# Patient Record
Sex: Female | Born: 1965 | Race: Black or African American | Hispanic: No | Marital: Married | Smoking: Never smoker
Health system: Southern US, Community
[De-identification: ages and names within clinical notes are randomized; demographics above are authoritative.]

## PROBLEM LIST (undated history)

## (undated) DIAGNOSIS — J302 Other seasonal allergic rhinitis: Secondary | ICD-10-CM

## (undated) DIAGNOSIS — E119 Type 2 diabetes mellitus without complications: Secondary | ICD-10-CM

## (undated) DIAGNOSIS — D649 Anemia, unspecified: Secondary | ICD-10-CM

## (undated) DIAGNOSIS — G4730 Sleep apnea, unspecified: Secondary | ICD-10-CM

## (undated) DIAGNOSIS — K649 Unspecified hemorrhoids: Secondary | ICD-10-CM

## (undated) DIAGNOSIS — D219 Benign neoplasm of connective and other soft tissue, unspecified: Secondary | ICD-10-CM

## (undated) HISTORY — PX: KNEE SURGERY: SHX244

## (undated) HISTORY — DX: Type 2 diabetes mellitus without complications: E11.9

## (undated) HISTORY — DX: Unspecified hemorrhoids: K64.9

## (undated) HISTORY — DX: Benign neoplasm of connective and other soft tissue, unspecified: D21.9

---

## 1995-03-01 HISTORY — PX: TUBAL LIGATION: SHX77

## 2004-08-16 ENCOUNTER — Emergency Department: Payer: Self-pay | Admitting: General Practice

## 2007-07-31 ENCOUNTER — Ambulatory Visit: Payer: Self-pay | Admitting: Family Medicine

## 2008-02-29 HISTORY — PX: LEEP: SHX91

## 2008-09-09 ENCOUNTER — Ambulatory Visit: Payer: Self-pay | Admitting: Family Medicine

## 2008-09-12 ENCOUNTER — Ambulatory Visit: Payer: Self-pay | Admitting: Family Medicine

## 2008-12-02 ENCOUNTER — Ambulatory Visit: Payer: Self-pay | Admitting: Unknown Physician Specialty

## 2008-12-24 ENCOUNTER — Emergency Department: Payer: Self-pay | Admitting: Unknown Physician Specialty

## 2009-01-19 ENCOUNTER — Ambulatory Visit: Payer: Self-pay | Admitting: Family Medicine

## 2009-06-25 ENCOUNTER — Ambulatory Visit: Payer: Self-pay | Admitting: Otolaryngology

## 2010-04-07 ENCOUNTER — Ambulatory Visit: Payer: Self-pay | Admitting: Family Medicine

## 2011-04-26 ENCOUNTER — Ambulatory Visit: Payer: Self-pay | Admitting: Family Medicine

## 2012-02-02 ENCOUNTER — Ambulatory Visit: Payer: Self-pay | Admitting: Internal Medicine

## 2012-02-29 ENCOUNTER — Ambulatory Visit: Payer: Self-pay | Admitting: Internal Medicine

## 2012-02-29 HISTORY — PX: TONSILLECTOMY: SUR1361

## 2012-05-15 ENCOUNTER — Ambulatory Visit: Payer: Self-pay | Admitting: Internal Medicine

## 2012-06-26 ENCOUNTER — Ambulatory Visit: Payer: Self-pay | Admitting: Unknown Physician Specialty

## 2012-07-02 ENCOUNTER — Ambulatory Visit: Payer: Self-pay | Admitting: Otolaryngology

## 2012-07-03 LAB — PATHOLOGY REPORT

## 2013-07-10 ENCOUNTER — Ambulatory Visit: Payer: Self-pay | Admitting: Internal Medicine

## 2013-09-02 ENCOUNTER — Ambulatory Visit: Payer: Self-pay

## 2013-09-12 ENCOUNTER — Ambulatory Visit: Payer: Self-pay | Admitting: Internal Medicine

## 2013-09-22 ENCOUNTER — Emergency Department: Payer: Self-pay | Admitting: Emergency Medicine

## 2013-09-22 LAB — BASIC METABOLIC PANEL
Anion Gap: 8 (ref 7–16)
BUN: 19 mg/dL — ABNORMAL HIGH (ref 7–18)
Calcium, Total: 8.5 mg/dL (ref 8.5–10.1)
Chloride: 105 mmol/L (ref 98–107)
Co2: 27 mmol/L (ref 21–32)
Creatinine: 0.98 mg/dL (ref 0.60–1.30)
EGFR (African American): >60
Glucose: 89 mg/dL (ref 65–99)
Osmolality: 281 (ref 275–301)
Potassium: 3.8 mmol/L (ref 3.5–5.1)
Sodium: 140 mmol/L (ref 136–145)

## 2013-09-22 LAB — CBC
HCT: 36.2 % (ref 35.0–47.0)
HGB: 12.1 g/dL (ref 12.0–16.0)
MCH: 29.6 pg (ref 26.0–34.0)
MCHC: 33.4 g/dL (ref 32.0–36.0)
MCV: 89 fL (ref 80–100)
Platelet: 284 10*3/uL (ref 150–440)
RBC: 4.09 10*6/uL (ref 3.80–5.20)
RDW: 12.9 % (ref 11.5–14.5)
WBC: 5.2 10*3/uL (ref 3.6–11.0)

## 2013-09-22 LAB — HEPATIC FUNCTION PANEL A (ARMC)
ALT: 28 U/L
Albumin: 4 g/dL (ref 3.4–5.0)
Alkaline Phosphatase: 50 U/L
Bilirubin, Direct: <0.1 mg/dL (ref 0.00–0.20)
Bilirubin,Total: 0.3 mg/dL (ref 0.2–1.0)
SGOT(AST): 39 U/L — ABNORMAL HIGH (ref 15–37)
Total Protein: 7.9 g/dL (ref 6.4–8.2)

## 2013-09-22 LAB — TROPONIN I

## 2013-09-22 LAB — LIPASE, BLOOD: Lipase: 335 U/L (ref 73–393)

## 2013-09-23 ENCOUNTER — Encounter: Payer: Self-pay | Admitting: General Surgery

## 2013-09-23 ENCOUNTER — Ambulatory Visit (INDEPENDENT_AMBULATORY_CARE_PROVIDER_SITE_OTHER): Payer: BC Managed Care – PPO | Admitting: General Surgery

## 2013-09-23 VITALS — BP 122/74 | HR 76 | Resp 12 | Ht 64.0 in | Wt 174.0 lb

## 2013-09-23 DIAGNOSIS — K8020 Calculus of gallbladder without cholecystitis without obstruction: Secondary | ICD-10-CM

## 2013-09-23 NOTE — Progress Notes (Signed)
Patient ID: Mandy Shea, female   DOB: 05/30/1965, 48 y.o.   MRN: [id number]  Chief Complaint  Patient presents with  . Other    gallstones    HPI Mandy Shea is a 48 y.o. female  Here today for a evaluation of gallstones. Patient states she has been having abdominal pain for 2 months in her right upper quadrant.  Patient states she pain last for about two to three hours . She had a ultrasound done on 08/13/13 at [facility].Patient was seen in the ER on 09/21/13 and had labs done.  She reports she has had 8-9 episodes of pain in ruq area, lasting upto 3-4 hrs. Cannot get comfortable during the attacks. Has had vomiting and some diarrhea with pain. HPI  Past Medical History  Diagnosis Date  . Diabetes mellitus without complication   . Hemorrhoids     Past Surgical History  Procedure Laterality Date  . Tubal ligation  1997  . Tonsillectomy  2014  . Knee surgery Right     History reviewed. No pertinent family history.  Social History History  Substance Use Topics  . Smoking status: Never Smoker   . Smokeless tobacco: Never Used  . Alcohol Use: No    Allergies  Allergen Reactions  . Augmentin [Amoxicillin-Pot Clavulanate] Itching    Current Outpatient Prescriptions  Medication Sig Dispense Refill  . loratadine (CLARITIN) 10 MG tablet Take 10 mg by mouth daily.      Marland Kitchen losartan (COZAAR) 25 MG tablet Take 25 mg by mouth daily.      . montelukast (SINGULAIR) 10 MG tablet Take 10 mg by mouth at bedtime.      . Saxagliptin-Metformin (KOMBIGLYZE XR) 2.06-998 MG TB24 Take 2 tablets by mouth daily.      . simvastatin (ZOCOR) 20 MG tablet Take 20 mg by mouth daily.      Marland Kitchen VITAMIN D, CHOLECALCIFEROL, PO Take 1 tablet by mouth daily.       No current facility-administered medications for this visit.    Review of Systems Review of Systems  Constitutional: Positive for chills. Negative for fever, diaphoresis, activity change, appetite change, fatigue and unexpected weight  change.  Respiratory: Negative.   Cardiovascular: Negative.   Gastrointestinal: Positive for nausea, vomiting, abdominal pain, diarrhea and anal bleeding. Negative for constipation, blood in stool, abdominal distention and rectal pain.    Blood pressure 122/74, pulse 76, resp. rate 12, height 5\' 4"  (1.626 m), weight 174 lb (78.926 kg), last menstrual period 09/23/2013.  Physical Exam Physical Exam  Constitutional: She is oriented to person, place, and time. She appears well-developed and well-nourished.  Eyes: Conjunctivae are normal. No scleral icterus.  Neck: Neck supple.  Cardiovascular: Normal rate, regular rhythm and normal heart sounds.   Pulmonary/Chest: Effort normal and breath sounds normal.  Abdominal: Soft. Normal appearance and bowel sounds are normal. She exhibits no mass. There is no hepatomegaly. There is no tenderness. No hernia.  Neurological: She is alert and oriented to person, place, and time.  Skin: Skin is warm and dry.    Data Reviewed Ultrasound and labs reviewed. CBC.LFTs were normal. US showed multiple tiny stones  Assessment    Symptoms consistent with biliary colic. Symptomatic cholelithiasis     Plan    Discussed cholecystectomy. Procedure, risks and benefits discussed    Patient's surgery has been scheduled for 09-26-13 at [facility].   Saray Capasso G 09/23/2013, 12:01 PM

## 2013-09-23 NOTE — Patient Instructions (Addendum)

## 2013-09-25 ENCOUNTER — Ambulatory Visit: Payer: Self-pay | Admitting: General Surgery

## 2013-09-26 ENCOUNTER — Ambulatory Visit: Payer: Self-pay | Admitting: General Surgery

## 2013-09-26 ENCOUNTER — Encounter: Payer: Self-pay | Admitting: General Surgery

## 2013-09-26 DIAGNOSIS — K8020 Calculus of gallbladder without cholecystitis without obstruction: Secondary | ICD-10-CM

## 2013-09-26 HISTORY — PX: CHOLECYSTECTOMY: SHX55

## 2013-09-30 ENCOUNTER — Telehealth: Payer: Self-pay | Admitting: *Deleted

## 2013-09-30 ENCOUNTER — Encounter: Payer: Self-pay | Admitting: General Surgery

## 2013-09-30 NOTE — Telephone Encounter (Signed)
Patient called the answering service during lunch and requested a call back.   This patient was contacted and states that she has swelling in her right arm status post gallbladder surgery that was done on 09-26-13. Patient states this was the same arm that IV was in at time of surgery. She was instructed to apply heat to the area and call the office if she has further concerns. Patient verbalizes understanding.

## 2013-10-01 LAB — PATHOLOGY REPORT

## 2013-10-03 ENCOUNTER — Encounter: Payer: Self-pay | Admitting: General Surgery

## 2013-10-10 ENCOUNTER — Ambulatory Visit (INDEPENDENT_AMBULATORY_CARE_PROVIDER_SITE_OTHER): Payer: Self-pay | Admitting: General Surgery

## 2013-10-10 ENCOUNTER — Encounter: Payer: Self-pay | Admitting: General Surgery

## 2013-10-10 VITALS — BP 118/70 | HR 74 | Resp 12 | Ht 64.0 in | Wt 169.0 lb

## 2013-10-10 DIAGNOSIS — K8020 Calculus of gallbladder without cholecystitis without obstruction: Secondary | ICD-10-CM

## 2013-10-10 NOTE — Patient Instructions (Addendum)
The patient is aware to call back for any questions or concerns. Patient to return as needed. 

## 2013-10-10 NOTE — Progress Notes (Signed)
Here today for postoperative visit, laparoscopic cholecystectomy done 09-26-13. Patient doing well no complaints.   Port sites clean and healing well. Abdomen is soft, nontender. Lungs clear Patient to return as needed.

## 2013-10-11 ENCOUNTER — Encounter: Payer: Self-pay | Admitting: General Surgery

## 2013-12-31 ENCOUNTER — Encounter: Payer: Self-pay | Admitting: General Surgery

## 2014-01-01 IMAGING — MG MAM DGTL SCRN MAM NO ORDER W/CAD
1 series · 4 of 4 positions shown · non-contrast
Comparison: none

REASON FOR EXAM: SCR MAMMO NO ORDER
COMMENTS:

[Series 1340: R CC · right · 4 of 4 slices shown]
[im 1/4]
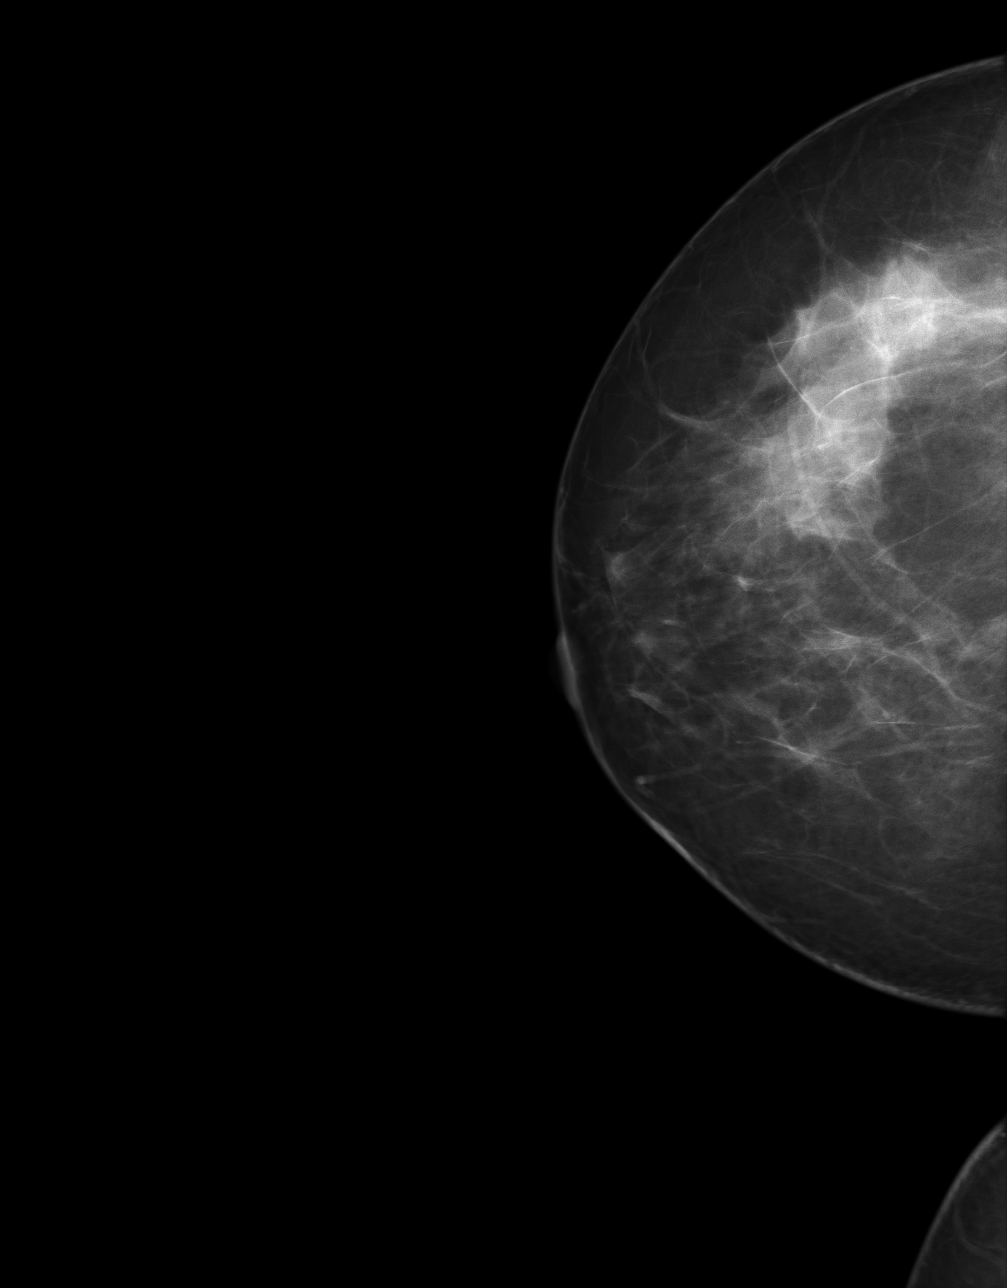
[im 2/4]
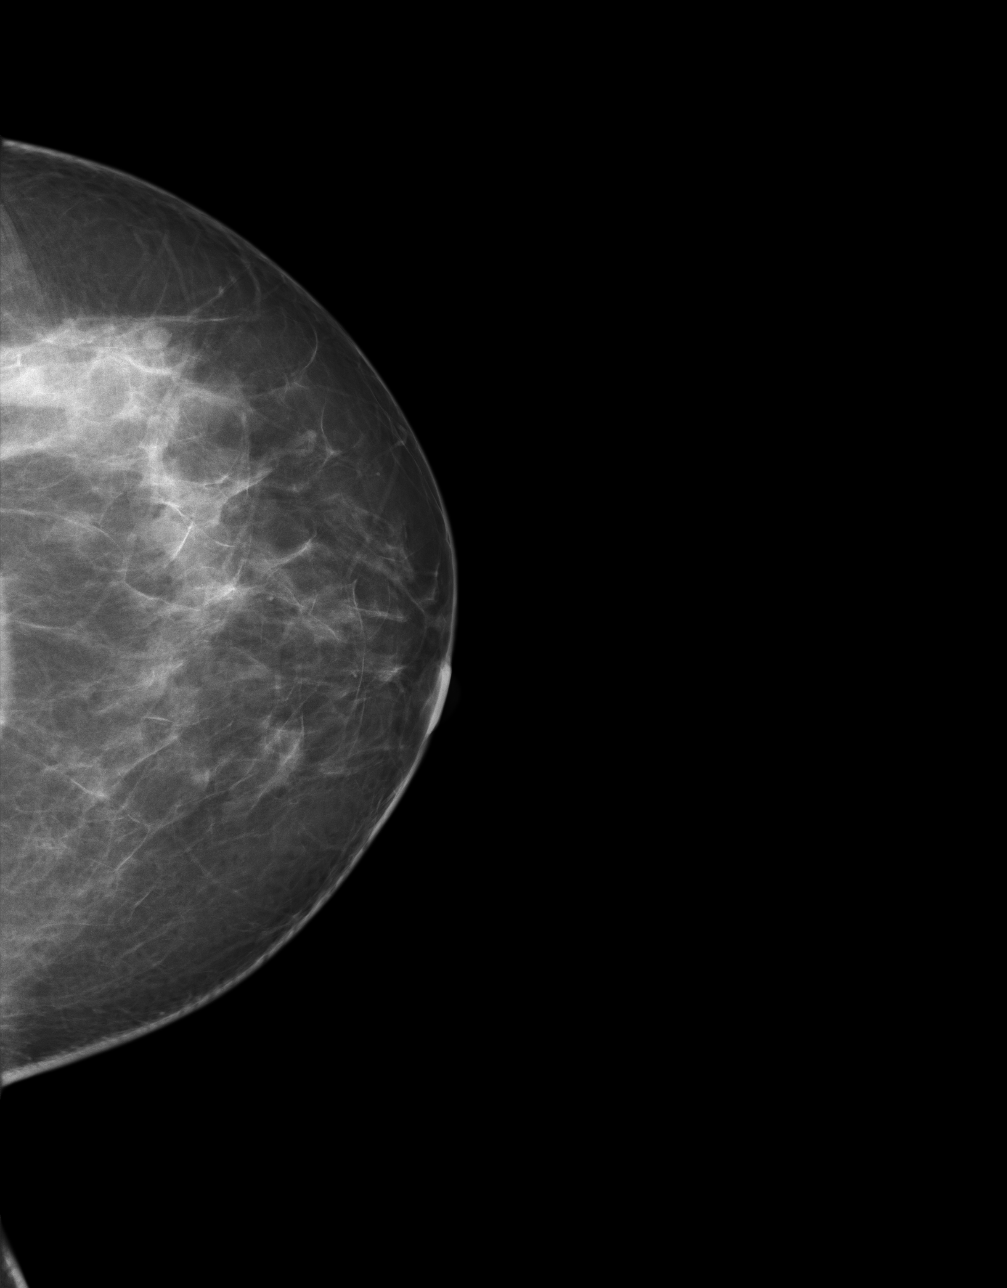
[im 3/4]
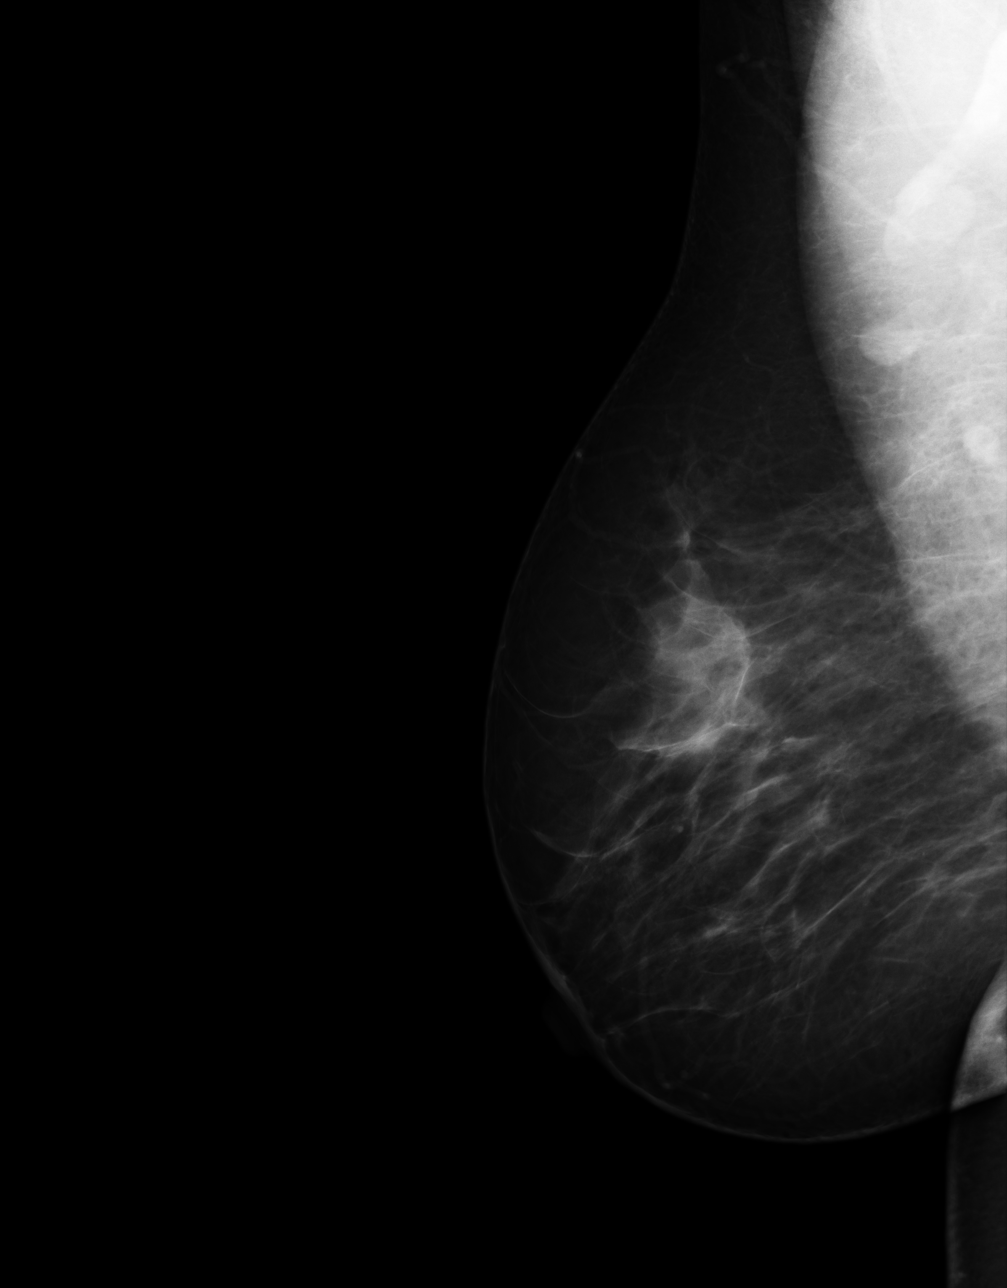
[im 4/4]
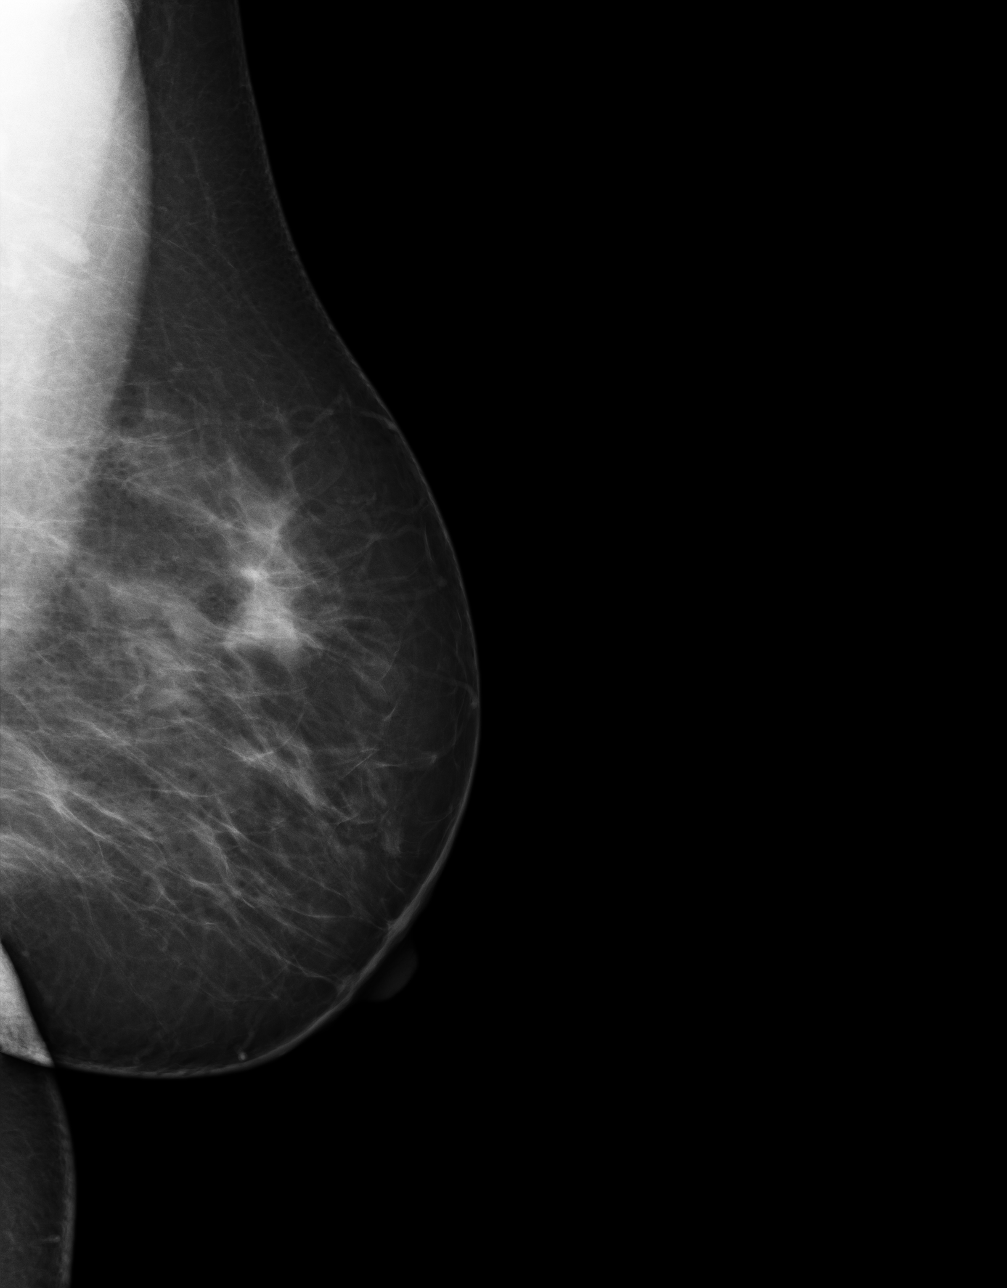

[4 of 4 positions shown; findings below may reference images not displayed]

PROCEDURE:     MAM - MAM DGTL SCRN MAM NO ORDER W/CAD  - April 26, 2011  [DATE]

RESULT:     Comparison is made to prior examinations April 07, 2010,
09/09/2008 and April 23, 2002.

There are scattered fibroglandular elements bilaterally. No dominant mass or
malignant-appearing microcalcifications are seen. A stable-appearing
intramammary lymph node is again noted laterally on the left.
IMPRESSION: 1. Bilaterally benign-appearing screening mammography.
2. Continued annual screening mammography is recommended.
3. BI-RADS: Category 1-Negative.

A negative mammogram report does not preclude biopsy or other evaluation of
a clinically palpable or otherwise suspicious mass or lesion.  Breast cancer
may not be detected by mammography in up to 10% of cases.

## 2014-06-20 NOTE — Op Note (Signed)
PATIENT NAME:  Mandy Shea, BETTCHER MR#:  [id number] DATE OF BIRTH:  November 23, 1965  DATE OF PROCEDURE:  07/02/2012  PREOPERATIVE DIAGNOSIS: Chronic tonsillitis and tonsillolithiasis.   POSTOPERATIVE DIAGNOSIS: Chronic tonsillitis and tonsillolithiasis.  PROCEDURE PERFORMED: Tonsillectomy, greater than age 48.   SURGEON: Carloyn Manner, M.D.   ANESTHESIA: General endotracheal anesthesia.   ESTIMATED BLOOD LOSS: 20 mL.   IV FLUIDS: Please see anesthesia record.   COMPLICATIONS: None.   DRAINS/STENT PLACEMENTS: None.   SPECIMENS: Right and left tonsils.   INDICATIONS FOR PROCEDURE: The patient is a 49 year old female with history of sleep disordered breathing, tonsillar hypertrophy and tonsillolithiasis resistant to medical management.   OPERATIVE FINDINGS: 3+ cryptic tonsils with tonsillolithiasis in place.   DESCRIPTION OF PROCEDURE: After the patient was identified in holding, benefits and risks of the procedure were discussed and consent was reviewed and the H and P was updated,  the patient was taken to the operating room and placed in the supine position. General endotracheal anesthesia was induced. The patient was rotated 90 degrees. A shoulder roll was placed and a McIvor mouth gag was inserted into the patient's oral cavity and suspended from Mayo stand without injury to teeth, lip, or gums. At this time, visualization of patient's tonsils revealed a cryptic nature with tonsillolithiasis in place bilaterally. A red rubber catheter was placed in the patient's right nasal cavity for retraction of uvula and soft palate superiorly. A curved Allis clamp was attached to the superior pole of the patient's right tonsil. This was retracted medially and inferiorly and the patient's right tonsil was excised in a subcapsular plane using Bovie electrocautery. Meticulous hemostasis was achieved in the tonsillar bed using Bovie suction cautery. Attention was directed to the patient's left tonsil. In a  similar fashion, the patient's left tonsil was grasped on the superior pole. It was retracted medially and inferiorly and the patient's left tonsil excised in a subcapsular plane using Bovie electrocautery. Meticulous hemostasis was achieved in bilateral tonsillar beds using Bovie suction cautery. The patient's oral cavity was copiously irrigated with sterile saline and 2.5 mL of 0.5% Marcaine plain were injected into the anterior and posterior tonsillar pillars bilaterally. At this time, care of the patient was transferred to anesthesia where the patient tolerated the procedure well.   ____________________________ Jerene Bears, MD ccv:aw D: 07/02/2012 11:20:07 ET T: 07/02/2012 11:26:57 ET JOB#: [id number]  cc: Jerene Bears, MD, <Dictator> Jerene Bears MD ELECTRONICALLY SIGNED 07/10/2012 9:47

## 2014-06-21 NOTE — Op Note (Signed)
PATIENT NAME:  Mandy Shea, Mandy Shea MR#:  [id number] DATE OF BIRTH:  1965/08/20  DATE OF PROCEDURE:  09/26/2013  PREOPERATIVE DIAGNOSIS: Cholelithiasis.   POSTOPERATIVE DIAGNOSIS: Cholelithiasis.  PROCEDURE: Laparoscopy and cholecystectomy with intraoperative cholangiogram.   SURGEON: Mckinley Jewel, M.D.   ANESTHESIA: General.   COMPLICATIONS: None.   ESTIMATED BLOOD LOSS: Minimal, less than 25 mL.   DRAINS: None.   DESCRIPTION OF PROCEDURE: The patient was put to sleep in the supine position on the operating room table. The abdomen was prepped and draped out as a sterile field. Timeout was performed. A small port site incision was made just above the umbilicus and the fascia was lifted up and a Veress needle with the InnerDyne sleeve positioned in the peritoneal cavity, verified with the hanging drop method. Pneumoperitoneum was obtained and a 10 mm port was placed. With the camera introduced, there was 1 single omental adhesion in the right upper quadrant from a previous port site. Epigastric and 2 lateral 5 mm ports were placed. The small adhesion of the omentum was easily taken down with cautery. There was good visualization of the gallbladder, which appeared to be free of any adhesions, and there appeared to be some minimal thickening around the Hartmann's pouch area, but not so much in the body of the fundus. With adequate cephalad traction, the cystic duct area was easily identified. The cystic duct was freed. The common duct was also identified and appeared to be of normal size. The cystic artery was in its normal position and this was freed. Kumar clamp and catheter were positioned. Cholangiogram was performed. It showed good filling of the hepatic duct and the proximal radicles partially, and the common bile duct, all of which appeared to be free of filling defects and there was free flow into the duodenum. The catheter in the gallbladder was used to decompress it with thick bile removed.  After the catheter was removed, the cystic duct was hemoclipped and cut. The cystic artery was similarly hemoclipped and cut, and the gallbladder was dissected free from its bed using cautery for control of bleeding. A small amount of fluid was used to irrigate out the right upper quadrant and suctioned out. A 5 mm scope was used in the epigastric port site. The gallbladder was brought out through the umbilical port site and noted to contain multiple tiny stones, about 3 to 4 mm size. The fascial opening at the umbilicus was closed with a 2-0 Vicryl stitch using a suture passer. Pneumoperitoneum was released and the remaining ports were removed. Skin incisions were closed with subcuticular 4-0 Vicryl, reinforced with Steri-Strips, tincture of benzoin and sealed dressings with Tegaderm were placed. The patient was subsequently extubated and returned to the recovery room in stable condition.   ____________________________ S.Robinette Haines, MD sgs:sb D: 09/27/2013 08:20:36 ET T: 09/27/2013 11:14:18 ET JOB#: [id number]  cc: S.G. Jamal Collin, MD, <Dictator> [facility] Robinette Haines MD ELECTRONICALLY SIGNED 09/27/2013 12:17

## 2014-06-25 ENCOUNTER — Other Ambulatory Visit: Payer: Self-pay

## 2014-06-25 DIAGNOSIS — Z1231 Encounter for screening mammogram for malignant neoplasm of breast: Secondary | ICD-10-CM

## 2014-07-15 ENCOUNTER — Ambulatory Visit
Admission: RE | Admit: 2014-07-15 | Discharge: 2014-07-15 | Disposition: A | Discharge status: Home / Self Care | Payer: BC Managed Care – PPO | Source: Ambulatory Visit | Attending: Internal Medicine | Admitting: Internal Medicine

## 2014-07-15 DIAGNOSIS — Z1231 Encounter for screening mammogram for malignant neoplasm of breast: Secondary | ICD-10-CM | POA: Diagnosis present

## 2014-07-22 ENCOUNTER — Other Ambulatory Visit: Payer: Self-pay | Admitting: Internal Medicine

## 2014-07-22 DIAGNOSIS — Z1231 Encounter for screening mammogram for malignant neoplasm of breast: Secondary | ICD-10-CM

## 2015-06-10 ENCOUNTER — Other Ambulatory Visit: Payer: Self-pay | Admitting: Internal Medicine

## 2015-06-10 DIAGNOSIS — Z1231 Encounter for screening mammogram for malignant neoplasm of breast: Secondary | ICD-10-CM

## 2015-07-16 ENCOUNTER — Ambulatory Visit
Admission: RE | Admit: 2015-07-16 | Discharge: 2015-07-16 | Disposition: A | Discharge status: Home / Self Care | Payer: BC Managed Care – PPO | Source: Ambulatory Visit | Attending: Internal Medicine | Admitting: Internal Medicine

## 2015-07-16 DIAGNOSIS — Z1231 Encounter for screening mammogram for malignant neoplasm of breast: Secondary | ICD-10-CM | POA: Diagnosis not present

## 2016-07-12 ENCOUNTER — Other Ambulatory Visit: Payer: Self-pay | Admitting: Internal Medicine

## 2016-07-12 DIAGNOSIS — Z1231 Encounter for screening mammogram for malignant neoplasm of breast: Secondary | ICD-10-CM

## 2016-07-26 ENCOUNTER — Ambulatory Visit
Admission: RE | Admit: 2016-07-26 | Discharge: 2016-07-26 | Disposition: A | Discharge status: Home / Self Care | Payer: BC Managed Care – PPO | Source: Ambulatory Visit | Attending: Internal Medicine | Admitting: Internal Medicine

## 2016-07-26 DIAGNOSIS — Z1231 Encounter for screening mammogram for malignant neoplasm of breast: Secondary | ICD-10-CM | POA: Insufficient documentation

## 2016-10-28 ENCOUNTER — Other Ambulatory Visit: Payer: Self-pay | Admitting: Family Medicine

## 2016-10-28 ENCOUNTER — Ambulatory Visit
Admission: RE | Admit: 2016-10-28 | Discharge: 2016-10-28 | Disposition: A | Discharge status: Home / Self Care | Payer: BC Managed Care – PPO | Source: Ambulatory Visit | Attending: Family Medicine | Admitting: Family Medicine

## 2016-10-28 DIAGNOSIS — N83201 Unspecified ovarian cyst, right side: Secondary | ICD-10-CM | POA: Insufficient documentation

## 2016-10-28 DIAGNOSIS — D259 Leiomyoma of uterus, unspecified: Secondary | ICD-10-CM | POA: Insufficient documentation

## 2016-10-28 DIAGNOSIS — N939 Abnormal uterine and vaginal bleeding, unspecified: Secondary | ICD-10-CM | POA: Diagnosis not present

## 2016-10-28 DIAGNOSIS — N924 Excessive bleeding in the premenopausal period: Secondary | ICD-10-CM

## 2016-11-03 ENCOUNTER — Telehealth: Payer: Self-pay | Admitting: Obstetrics & Gynecology

## 2016-11-03 NOTE — Telephone Encounter (Signed)
[place]. Referring for Fibroids, excessive bleeding. Attempted to reach patient x.2

## 2016-11-03 NOTE — Telephone Encounter (Signed)
Pt is schedule 9/17 with AMS

## 2016-11-14 ENCOUNTER — Ambulatory Visit (INDEPENDENT_AMBULATORY_CARE_PROVIDER_SITE_OTHER): Payer: BC Managed Care – PPO | Admitting: Obstetrics and Gynecology

## 2016-11-14 ENCOUNTER — Encounter: Payer: Self-pay | Admitting: Obstetrics and Gynecology

## 2016-11-14 VITALS — BP 112/74 | HR 81 | Ht 64.0 in | Wt 179.0 lb

## 2016-11-14 DIAGNOSIS — D250 Submucous leiomyoma of uterus: Secondary | ICD-10-CM | POA: Diagnosis not present

## 2016-11-14 DIAGNOSIS — D251 Intramural leiomyoma of uterus: Secondary | ICD-10-CM | POA: Diagnosis not present

## 2016-11-14 DIAGNOSIS — N939 Abnormal uterine and vaginal bleeding, unspecified: Secondary | ICD-10-CM | POA: Diagnosis not present

## 2016-11-14 NOTE — Progress Notes (Signed)
Obstetrics & Gynecology Office Visit   Chief Complaint:  Chief Complaint  Patient presents with  . excessive bleeding    Referred by [facility]     History of Present Illness: 51 year old female referred by [facility] after evaluation of menorrhagia revealed uterine leiomyomata with one dominant 3cm submucosal lower uterine segment fibroid.  Given size and distortion of endometrial cavity endometrium was not clearly visualized.  She states she bled most last month but bleeding has stopped.  Does report associated moderate dysmenorrhea.  No history of abnormal paps.  She states that prior to the last several months her periods had been regular monthly, always slightly on the heavier side lasting up to 7 days.  She is currently sexually active, no any any hormonal cycle control.     Review of Systems: 10 point review of systems negative unless otherwise noted in HPI  Past Medical History:  Past Medical History:  Diagnosis Date  . Diabetes mellitus without complication ([place])   . Fibroids   . Hemorrhoids     Past Surgical History:  Past Surgical History:  Procedure Laterality Date  . CHOLECYSTECTOMY  09-26-13  . KNEE SURGERY Right   . LEEP  2010   [facility]  . TONSILLECTOMY  2014  . TUBAL LIGATION  1997    Gynecologic History: Patient's last menstrual period was 10/07/2016 (exact date).  Obstetric History: G2P2  Family History:  Family History  Problem Relation Age of Onset  . Breast cancer Neg Hx     Social History:  Social History   Social History  . Marital status: Married    Spouse name: N/A  . Number of children: N/A  . Years of education: N/A   Occupational History  . Not on file.   Social History Main Topics  . Smoking status: Never Smoker  . Smokeless tobacco: Never Used  . Alcohol use No  . Drug use: No  . Sexual activity: Yes    Birth control/ protection: None   Other Topics Concern  . Not on file   Social History Narrative  . No  narrative on file    Allergies:  Allergies  Allergen Reactions  . Augmentin [Amoxicillin-Pot Clavulanate] Itching    Medications: Prior to Admission medications   Medication Sig Start Date End Date Taking? Authorizing Provider  [place] 18 MG/3ML SOPN  10/14/16   [provider]    Physical Exam Vitals:  Vitals:   11/14/16 0932  BP: 112/74  Pulse: 81   Patient's last menstrual period was 10/07/2016 (exact date).  General: NAD HEENT: normocephalic, anicteric Thyroid: no enlargement, no palpable nodules Pulmonary: No increased work of breathing Abdomen: NABS, soft, non-tender, non-distended.  Umbilicus without lesions.  No hepatomegaly, splenomegaly or masses palpable. No evidence of hernia  Genitourinary:  External: Normal external female genitalia.  Normal urethral meatus, normal  Bartholin's and Skene's glands.    Vagina: Normal vaginal mucosa, no evidence of prolapse.    Cervix: Grossly normal in appearance, no bleeding  Uterus: Non-enlarged, mobile, normal contour.  No CMT  Adnexa: ovaries non-enlarged, no adnexal masses  Rectal: deferred  Lymphatic: no evidence of inguinal lymphadenopathy Extremities: no edema, erythema, or tenderness Neurologic: Grossly intact Psychiatric: mood appropriate, affect full  Female chaperone present for pelvic portions of the physical exam  Assessment: 52 y.o. G2P2 with AUB-L  Plan: Problem List Items Addressed This Visit    None    Visit Diagnoses    Intramural and  submucous leiomyoma of uterus    -  Primary   Abnormal uterine bleeding (AUB)         - Patient has three fibroids visualized on ultrasound by referring provider.  Two of these appear intramural fundal, the third appears submucosal and may be attempting to prolpase through the cervix.  This is the most likley source of the patients current bleeding patter.  We discussed management of submucosal fibroids and focal endometrial lesions.  Will proceed with  hysteroscopic myomectomy, as well as D&C to rule out any overt endometrial pathology.  -I have discussed with the patient the indications for the procedure. Included in the discussion were the options of therapy, as wall as their individual risks, benefits, and complications. Ample time was given to answer all questions.   In office pipelle biopsy generally provides comparable results to [facility], however this sampling modality may miss focal lesions if these were previously documented on ultrasound.  It is because of the potential to miss focal lesions that hysteroscopy D&C is also warranted in patient with continued postmenopausal bleeding that is not self limited regardless of prior in office biopsy results or ultrasound findings.  She understands that the risk of continued observation include worsening bleeding or worsening of any underlying pathology.  The choices include: 1. Doing nothing but following her symptoms 2. Attempts at hormonal manipulation with either BCP or Depo-Provera for premenopausal patients with no concern for focal lesion or endometrial pathology 3. D&C/hysteroscopy. 4. Endometrial ablation via Novasure or other techniques for premenopausal patients with no concern for focal lesion or endometrial pathology  5. As final resort, hysterectomy. After consideration of her history and findings, mutual decision has been made to proceed with D+C/hysteroscopy. While the incidence is low, the risks from this surgery include, but are not limited to, the risks of anesthesia, hemorrhage, infection, perforation, and injury to adjacent structures including bowel, bladder and blood vessels.   -A total of 20 minutes were spent in face-to-face contact with the patient during this encounter with over half of that time devoted to counseling and coordination of care.

## 2016-11-14 NOTE — Patient Instructions (Signed)
Dilation and Curettage or Vacuum Curettage Dilation and curettage (D&C) and vacuum curettage are minor procedures. A D&C involves stretching (dilation) the cervix and scraping (curettage) the inside lining of the uterus (endometrium). During a D&C, tissue is gently scraped from the endometrium, starting from the top portion of the uterus down to the lowest part of the uterus (cervix). During a vacuum curettage, the lining and tissue in the uterus are removed with the use of gentle suction. Curettage may be performed to either diagnose or treat a problem. As a diagnostic procedure, curettage is performed to examine tissues from the uterus. A diagnostic curettage may be done if you have:  Irregular bleeding in the uterus.  Bleeding with the development of clots.  Spotting between menstrual periods.  Prolonged menstrual periods or other abnormal bleeding.  Bleeding after menopause.  No menstrual period (amenorrhea).  A change in size and shape of the uterus.  Abnormal endometrial cells discovered during a Pap test.  As a treatment procedure, curettage may be performed for the following reasons:  Removal of an IUD (intrauterine device).  Removal of retained placenta after giving birth.  Abortion.  Miscarriage.  Removal of endometrial polyps.  Removal of uncommon types of noncancerous lumps (fibroids).  Tell a health care provider about:  Any allergies you have, including allergies to prescribed medicine or latex.  All medicines you are taking, including vitamins, herbs, eye drops, creams, and over-the-counter medicines. This is especially important if you take any blood-thinning medicine. Bring a list of all of your medicines to your appointment.  Any problems you or family members have had with anesthetic medicines.  Any blood disorders you have.  Any surgeries you have had.  Your medical history and any medical conditions you have.  Whether you are pregnant or may be  pregnant.  Recent vaginal infections you have had.  Recent menstrual periods, bleeding problems you have had, and what form of birth control (contraception) you use. What are the risks? Generally, this is a safe procedure. However, problems may occur, including:  Infection.  Heavy vaginal bleeding.  Allergic reactions to medicines.  Damage to the cervix or other structures or organs.  Development of scar tissue (adhesions) inside the uterus, which can cause abnormal amounts of menstrual bleeding. This may make it harder to get pregnant in the future.  A hole (perforation) or puncture in the uterine wall. This is rare.  What happens before the procedure? Staying hydrated Follow instructions from your health care provider about hydration, which may include:  Up to 2 hours before the procedure - you may continue to drink clear liquids, such as water, clear fruit juice, black coffee, and plain tea.  Eating and drinking restrictions Follow instructions from your health care provider about eating and drinking, which may include:  8 hours before the procedure - stop eating heavy meals or foods such as meat, fried foods, or fatty foods.  6 hours before the procedure - stop eating light meals or foods, such as toast or cereal.  6 hours before the procedure - stop drinking milk or drinks that contain milk.  2 hours before the procedure - stop drinking clear liquids. If your health care provider told you to take your medicine(s) on the day of your procedure, take them with only a sip of water.  Medicines  Ask your health care provider about: ? Changing or stopping your regular medicines. This is especially important if you are taking diabetes medicines or blood thinners. ? Taking   medicines such as aspirin and ibuprofen. These medicines can thin your blood. Do not take these medicines before your procedure if your health care provider instructs you not to.  You may be given antibiotic  medicine to help prevent infection. General instructions  For 24 hours before your procedure, do not: ? Douche. ? Use tampons. ? Use medicines, creams, or suppositories in the vagina. ? Have sexual intercourse.  You may be given a pregnancy test on the day of the procedure.  Plan to have someone take you home from the hospital or clinic.  You may have a blood or urine sample taken.  If you will be going home right after the procedure, plan to have someone with you for 24 hours. What happens during the procedure?  To reduce your risk of infection: ? Your health care team will wash or sanitize their hands. ? Your skin will be washed with soap.  An IV tube will be inserted into one of your veins.  You will be given one of the following: ? A medicine that numbs the area in and around the cervix (local anesthetic). ? A medicine to make you fall asleep (general anesthetic).  You will lie down on your back, with your feet in foot rests (stirrups).  The size and position of your uterus will be checked.  A lubricated instrument (speculum or Sims retractor) will be inserted into the back side of your vagina. The speculum will be used to hold apart the walls of your vagina so your health care provider can see your cervix.  A tool (tenaculum) will be attached to the lip of the cervix to stabilize it.  Your cervix will be softened and dilated. This may be done by: ? Taking a medicine. ? Having tapered dilators or thin rods (laminaria) or gradual widening instruments (tapered dilators) inserted into your cervix.  A small, sharp, curved instrument (curette) will be used to scrape a small amount of tissue or cells from the endometrium or cervical canal. In some cases, gentle suction is applied with the curette. The curette will then be removed. The cells will be taken to a lab for testing. The procedure may vary among health care providers and hospitals. What happens after the  procedure?  You may have mild cramping, backache, pain, and light bleeding or spotting. You may pass small blood clots from your vagina.  You may have to wear compression stockings. These stockings help to prevent blood clots and reduce swelling in your legs.  Your blood pressure, heart rate, breathing rate, and blood oxygen level will be monitored until the medicines you were given have worn off. Summary  Dilation and curettage (D&C) involves stretching (dilation) the cervix and scraping (curettage) the inside lining of the uterus (endometrium).  After the procedure, you may have mild cramping, backache, pain, and light bleeding or spotting. You may pass small blood clots from your vagina.  Plan to have someone take you home from the hospital or clinic. This information is not intended to replace advice given to you by your health care provider. Make sure you discuss any questions you have with your health care provider. Document Released: 02/14/2005 Document Revised: 11/01/2015 Document Reviewed: 11/01/2015 Elsevier Interactive Patient Education  2018 [employer]  

## 2016-11-16 ENCOUNTER — Encounter: Payer: Self-pay | Admitting: Obstetrics and Gynecology

## 2016-11-16 ENCOUNTER — Telehealth: Payer: Self-pay | Admitting: Obstetrics and Gynecology

## 2016-11-16 NOTE — Telephone Encounter (Signed)
-----   Message from Malachy Mood, MD sent at 11/15/2016 10:25 PM EDT ----- Regarding: surgery Surgery Date:   LOS: outpatient  Surgery Booking Request Patient Full Name: Mandy Shea MRN: [id number]  DOB: 06-Nov-1965  Surgeon: Malachy Mood, MD  Requested Surgery Date and Time: 2-4 weeks Primary Diagnosis and Code: Submucosal fibroid Secondary Diagnosis and Code: abnormal uterine bleeding Surgical Procedure: hysteroscopic myomectomy L&D Notification:N/A Admission Status: same day surgery Length of Surgery: 1h Special Case Needs: myosure and dolphine H&P: day of is fine or in office (date) Phone Interview or Office Pre-Admit: preadmit Interpreter: none Language: English Medical Clearance: none Special Scheduling Instructions: none

## 2016-11-16 NOTE — Telephone Encounter (Signed)
Patient is aware of OR on 11/24/16. Patient is aware she will sign consents w/ Dr. Georgianne Fick on day of surgery, and that I will let her know when her Pre-admit Testing phone interview will be. Patient is aware she will receive calls from the [facility] and Pharmacy. Ext given.

## 2016-11-17 ENCOUNTER — Encounter
Admission: RE | Admit: 2016-11-17 | Discharge: 2016-11-17 | Disposition: A | Discharge status: Home / Self Care | Payer: BC Managed Care – PPO | Source: Ambulatory Visit | Attending: Obstetrics and Gynecology | Admitting: Obstetrics and Gynecology

## 2016-11-17 HISTORY — DX: Sleep apnea, unspecified: G47.30

## 2016-11-17 HISTORY — DX: Other seasonal allergic rhinitis: J30.2

## 2016-11-17 HISTORY — DX: Anemia, unspecified: D64.9

## 2016-11-17 NOTE — Patient Instructions (Signed)
  Your procedure is scheduled on: 11-24-16 THURSDAY Report to Same Day Surgery 2nd floor medical mall [facility] Entrance-take elevator on left to 2nd floor.  Check in with surgery information desk.) To find out your arrival time please call [phone] between 1PM - 3PM on 11-23-16 [facility]  Remember: Instructions that are not followed completely may result in serious medical risk, up to and including death, or upon the discretion of your surgeon and anesthesiologist your surgery may need to be rescheduled.    _x___ 1. Do not eat food after midnight the night before your procedure. NO GUM CHEWING OR HARD CANDIES.  You may drink clear liquids up to 2 hours before you are scheduled to arrive at the hospital for your procedure.  Do not drink clear liquids within 2 hours of your scheduled arrival to the hospital.  Clear liquids include  --Water or Apple juice without pulp  --Clear carbohydrate beverage such as ClearFast or Gatorade  --Black Coffee or Clear Tea (No milk, no creamers, do not add anything to the coffee or Tea)  Type 1 and type 2 diabetics should only drink water.     __x__ 2. No Alcohol for 24 hours before or after surgery.   __x__3. No Smoking for 24 prior to surgery.   ____  4. Bring all medications with you on the day of surgery if instructed.    __x__ 5. Notify your doctor if there is any change in your medical condition     (cold, fever, infections).     Do not wear jewelry, make-up, hairpins, clips or nail polish.  Do not wear lotions, powders, or perfumes. You may wear deodorant.  Do not shave 48 hours prior to surgery. Men may shave face and neck.  Do not bring valuables to the hospital.    [facility] is not responsible for any belongings or valuables.               Contacts, dentures or bridgework may not be worn into surgery.  Leave your suitcase in the car. After surgery it may be brought to your room.  For patients admitted to the hospital, discharge time  is determined by your treatment team.   Patients discharged the day of surgery will not be allowed to drive home.  You will need someone to drive you home and stay with you the night of your procedure.    Please read over the following fact sheets that you were given:      ____ Take anti-hypertensive listed below, cardiac, seizure, asthma, anti-reflux and psychiatric medicines. These include:  1. NONE  2.  3.  4.  5.  6.  ____Fleets enema or Magnesium Citrate as directed.   _x___ Use CHG Soap or sage wipes as directed on instruction sheet   ____ Use inhalers on the day of surgery and bring to hospital day of surgery  _X___ Stop Metformin and Janumet 2 days prior to surgery-LAST DOSE OF METFORMIN ON Monday, September 24TH    ____ Take 1/2 of usual insulin dose the night before surgery and none on the morning surgery.   ____ Follow recommendations from Cardiologist, Pulmonologist or PCP regarding stopping Aspirin, Coumadin, Plavix ,Eliquis, Effient, or Pradaxa, and Pletal.  X____Stop Anti-inflammatories such as Advil, Aleve, Ibuprofen, Motrin, Naproxen, Naprosyn, Goodies powders or aspirin productsNOW-OK to take Tylenol    ____ Stop supplements until after surgery.     ____ Bring C-Pap to the hospital.

## 2016-11-22 ENCOUNTER — Encounter
Admission: RE | Admit: 2016-11-22 | Discharge: 2016-11-22 | Disposition: A | Discharge status: Home / Self Care | Payer: BC Managed Care – PPO | Source: Ambulatory Visit | Attending: Obstetrics and Gynecology | Admitting: Obstetrics and Gynecology

## 2016-11-22 DIAGNOSIS — N83201 Unspecified ovarian cyst, right side: Secondary | ICD-10-CM | POA: Diagnosis not present

## 2016-11-22 DIAGNOSIS — G4730 Sleep apnea, unspecified: Secondary | ICD-10-CM | POA: Diagnosis not present

## 2016-11-22 DIAGNOSIS — N841 Polyp of cervix uteri: Secondary | ICD-10-CM | POA: Diagnosis not present

## 2016-11-22 DIAGNOSIS — N920 Excessive and frequent menstruation with regular cycle: Secondary | ICD-10-CM | POA: Diagnosis present

## 2016-11-22 DIAGNOSIS — Z7984 Long term (current) use of oral hypoglycemic drugs: Secondary | ICD-10-CM | POA: Diagnosis not present

## 2016-11-22 DIAGNOSIS — Z881 Allergy status to other antibiotic agents status: Secondary | ICD-10-CM | POA: Diagnosis not present

## 2016-11-22 DIAGNOSIS — Z79899 Other long term (current) drug therapy: Secondary | ICD-10-CM | POA: Diagnosis not present

## 2016-11-22 DIAGNOSIS — D250 Submucous leiomyoma of uterus: Secondary | ICD-10-CM | POA: Diagnosis present

## 2016-11-22 DIAGNOSIS — E119 Type 2 diabetes mellitus without complications: Secondary | ICD-10-CM | POA: Diagnosis not present

## 2016-11-22 LAB — BASIC METABOLIC PANEL
Anion gap: 8 (ref 5–15)
BUN: 12 mg/dL (ref 6–20)
CHLORIDE: 108 mmol/L (ref 101–111)
CO2: 25 mmol/L (ref 22–32)
Calcium: 8.7 mg/dL — ABNORMAL LOW (ref 8.9–10.3)
Creatinine, Ser: 0.74 mg/dL (ref 0.44–1.00)
GFR calc non Af Amer: >60 mL/min (ref >60)
Glucose, Bld: 144 mg/dL — ABNORMAL HIGH (ref 65–99)
POTASSIUM: 3.9 mmol/L (ref 3.5–5.1)
SODIUM: 141 mmol/L (ref 135–145)

## 2016-11-24 ENCOUNTER — Encounter
Admission: RE | Disposition: A | Discharge status: Home / Self Care | Payer: Self-pay | Source: Ambulatory Visit | Attending: Obstetrics and Gynecology

## 2016-11-24 ENCOUNTER — Ambulatory Visit
Admission: RE | Admit: 2016-11-24 | Discharge: 2016-11-24 | Disposition: A | Discharge status: Home / Self Care | Payer: BC Managed Care – PPO | Source: Ambulatory Visit | Attending: Obstetrics and Gynecology | Admitting: Obstetrics and Gynecology

## 2016-11-24 ENCOUNTER — Ambulatory Visit: Payer: BC Managed Care – PPO | Admitting: Certified Registered"

## 2016-11-24 ENCOUNTER — Encounter: Payer: Self-pay | Admitting: Emergency Medicine

## 2016-11-24 DIAGNOSIS — Z7984 Long term (current) use of oral hypoglycemic drugs: Secondary | ICD-10-CM | POA: Insufficient documentation

## 2016-11-24 DIAGNOSIS — E119 Type 2 diabetes mellitus without complications: Secondary | ICD-10-CM | POA: Insufficient documentation

## 2016-11-24 DIAGNOSIS — N841 Polyp of cervix uteri: Secondary | ICD-10-CM | POA: Diagnosis not present

## 2016-11-24 DIAGNOSIS — Z79899 Other long term (current) drug therapy: Secondary | ICD-10-CM | POA: Insufficient documentation

## 2016-11-24 DIAGNOSIS — Z881 Allergy status to other antibiotic agents status: Secondary | ICD-10-CM | POA: Insufficient documentation

## 2016-11-24 DIAGNOSIS — Z9889 Other specified postprocedural states: Secondary | ICD-10-CM

## 2016-11-24 DIAGNOSIS — N83201 Unspecified ovarian cyst, right side: Secondary | ICD-10-CM | POA: Insufficient documentation

## 2016-11-24 DIAGNOSIS — G4730 Sleep apnea, unspecified: Secondary | ICD-10-CM | POA: Insufficient documentation

## 2016-11-24 DIAGNOSIS — N920 Excessive and frequent menstruation with regular cycle: Secondary | ICD-10-CM | POA: Insufficient documentation

## 2016-11-24 HISTORY — PX: HYSTEROSCOPY WITH D & C: SHX1775

## 2016-11-24 LAB — GLUCOSE, CAPILLARY
GLUCOSE-CAPILLARY: 71 mg/dL (ref 65–99)
Glucose-Capillary: 71 mg/dL (ref 65–99)

## 2016-11-24 LAB — POCT PREGNANCY, URINE: Preg Test, Ur: NEGATIVE

## 2016-11-24 SURGERY — DILATATION AND CURETTAGE /HYSTEROSCOPY
Anesthesia: General | Wound class: Clean Contaminated

## 2016-11-24 MED ORDER — SODIUM CHLORIDE 0.9 % IV SOLN
INTRAVENOUS | Status: DC
  Administered 2016-11-24 (×2): via INTRAVENOUS

## 2016-11-24 MED ORDER — SODIUM CHLORIDE 0.9 % IJ SOLN
INTRAMUSCULAR | Status: AC
  Filled 2016-11-24: qty 100

## 2016-11-24 MED ORDER — LIDOCAINE HCL (CARDIAC) 20 MG/ML IV SOLN
INTRAVENOUS | Status: DC | PRN
  Administered 2016-11-24: 80 mg via INTRAVENOUS

## 2016-11-24 MED ORDER — FAMOTIDINE 20 MG PO TABS
ORAL_TABLET | ORAL | Status: AC
  Administered 2016-11-24: 20 mg via ORAL
  Filled 2016-11-24: qty 1

## 2016-11-24 MED ORDER — FENTANYL CITRATE (PF) 100 MCG/2ML IJ SOLN: 25.0000 ug | INTRAMUSCULAR | Status: DC | PRN

## 2016-11-24 MED ORDER — ONDANSETRON HCL 4 MG/2ML IJ SOLN
INTRAMUSCULAR | Status: AC
  Filled 2016-11-24: qty 2

## 2016-11-24 MED ORDER — IBUPROFEN 600 MG PO TABS: 600.0000 mg | ORAL_TABLET | Freq: Four times a day (QID) | ORAL | 3 refills | Status: DC | PRN

## 2016-11-24 MED ORDER — MIDAZOLAM HCL 2 MG/2ML IJ SOLN
INTRAMUSCULAR | Status: AC
  Filled 2016-11-24: qty 2

## 2016-11-24 MED ORDER — GLYCOPYRROLATE 0.2 MG/ML IJ SOLN
INTRAMUSCULAR | Status: DC | PRN
  Administered 2016-11-24: 0.1 mg via INTRAVENOUS

## 2016-11-24 MED ORDER — ONDANSETRON HCL 4 MG/2ML IJ SOLN
INTRAMUSCULAR | Status: DC | PRN
  Administered 2016-11-24: 4 mg via INTRAVENOUS

## 2016-11-24 MED ORDER — FENTANYL CITRATE (PF) 100 MCG/2ML IJ SOLN
INTRAMUSCULAR | Status: AC
  Filled 2016-11-24: qty 2

## 2016-11-24 MED ORDER — FAMOTIDINE 20 MG PO TABS
20.0000 mg | ORAL_TABLET | Freq: Once | ORAL | Status: AC
  Administered 2016-11-24: 20 mg via ORAL

## 2016-11-24 MED ORDER — PROPOFOL 10 MG/ML IV BOLUS
INTRAVENOUS | Status: AC
  Filled 2016-11-24: qty 40

## 2016-11-24 MED ORDER — OXYCODONE-ACETAMINOPHEN 5-325 MG PO TABS: 1.0000 | ORAL_TABLET | Freq: Four times a day (QID) | ORAL | 0 refills | Status: DC | PRN

## 2016-11-24 MED ORDER — ONDANSETRON HCL 4 MG/2ML IJ SOLN: 4.0000 mg | Freq: Once | INTRAMUSCULAR | Status: DC | PRN

## 2016-11-24 MED ORDER — PHENYLEPHRINE HCL 10 MG/ML IJ SOLN
INTRAMUSCULAR | Status: DC | PRN
  Administered 2016-11-24 (×2): 100 ug via INTRAVENOUS

## 2016-11-24 MED ORDER — LIDOCAINE HCL (PF) 2 % IJ SOLN
INTRAMUSCULAR | Status: AC
  Filled 2016-11-24: qty 4

## 2016-11-24 MED ORDER — GLYCOPYRROLATE 0.2 MG/ML IJ SOLN
INTRAMUSCULAR | Status: AC
  Filled 2016-11-24: qty 1

## 2016-11-24 MED ORDER — MIDAZOLAM HCL 2 MG/2ML IJ SOLN
INTRAMUSCULAR | Status: DC | PRN
  Administered 2016-11-24: 2 mg via INTRAVENOUS

## 2016-11-24 MED ORDER — PROPOFOL 10 MG/ML IV BOLUS
INTRAVENOUS | Status: DC | PRN
  Administered 2016-11-24: 130 mg via INTRAVENOUS

## 2016-11-24 MED ORDER — FENTANYL CITRATE (PF) 100 MCG/2ML IJ SOLN
INTRAMUSCULAR | Status: DC | PRN
  Administered 2016-11-24: 50 ug via INTRAVENOUS

## 2016-11-24 MED ORDER — VASOPRESSIN 20 UNIT/ML IV SOLN
INTRAVENOUS | Status: AC
  Filled 2016-11-24: qty 1

## 2016-11-24 SURGICAL SUPPLY — 26 items
ABLATOR ENDOMETRIAL MYOSURE (ABLATOR) IMPLANT
CANISTER SUC SOCK COL 7IN (MISCELLANEOUS) ×3 IMPLANT
CATH ROBINSON RED A/P 16FR (CATHETERS) ×3 IMPLANT
CNTNR SPEC 2.5X3XGRAD LEK (MISCELLANEOUS) ×2
CONT SPEC 4OZ STER OR WHT (MISCELLANEOUS) ×1
CONTAINER SPEC 2.5X3XGRAD LEK (MISCELLANEOUS) ×2 IMPLANT
DEVICE MYOSURE LITE (MISCELLANEOUS) IMPLANT
ELECT REM PT RETURN 9FT ADLT (ELECTROSURGICAL) ×3
ELECTRODE REM PT RTRN 9FT ADLT (ELECTROSURGICAL) ×2 IMPLANT
GLOVE BIO SURGEON STRL SZ7 (GLOVE) ×3 IMPLANT
GOWN STRL REUS W/ TWL LRG LVL3 (GOWN DISPOSABLE) ×4 IMPLANT
GOWN STRL REUS W/TWL LRG LVL3 (GOWN DISPOSABLE) ×2
KIT RM TURNOVER CYSTO AR (KITS) ×3 IMPLANT
MYOSURE LITE POLYP REMOVAL (MISCELLANEOUS) ×3 IMPLANT
NEEDLE HYPO 25X1 1.5 SAFETY (NEEDLE) ×3 IMPLANT
PACK DNC HYST (MISCELLANEOUS) ×3 IMPLANT
PAD OB MATERNITY 4.3X12.25 (PERSONAL CARE ITEMS) ×3 IMPLANT
PAD PREP 24X41 OB/GYN DISP (PERSONAL CARE ITEMS) ×3 IMPLANT
SEAL ROD LENS SCOPE MYOSURE (ABLATOR) IMPLANT
SOL .9 NS 3000ML IRR  AL (IV SOLUTION) ×1
SOL .9 NS 3000ML IRR UROMATIC (IV SOLUTION) ×2 IMPLANT
SYR 5ML 18GX1 1/2 (NEEDLE) ×3 IMPLANT
SYR CONTROL 10ML (SYRINGE) ×3 IMPLANT
TOWEL OR 17X26 4PK STRL BLUE (TOWEL DISPOSABLE) ×3 IMPLANT
TUBING CONNECTING 10 (TUBING) ×3 IMPLANT
TUBING HYSTEROSCOPY DOLPHIN (MISCELLANEOUS) ×3 IMPLANT

## 2016-11-24 NOTE — Op Note (Signed)
Patient Name: Mandy Shea Date of Procedure: @TODAY @  Preoperative Diagnosis: 1) 51 y.o. with menorrhagia 2) 3cm submucosal leiomyoma in lower uterine segment on ultrasound  Postoperative Diagnosis: 1) 51 y.o. with menorrhagia 2) No evidence of submucosal leiomyoma, small intracervical polyp  Operation Performed: Hysteroscopy, dilation and curettage  Indication: Menorrhagia, submucosal fibroid  Anesthesia: General  Primary Surgeon: Malachy Mood, MD  Assistant: none  Preoperative Antibiotics: none  Estimated Blood Loss: Minimal  IV Fluids: 543mL  Urine Output:: ~183mL straight cath  Drains or Tubes: none  Implants: none  Specimens Removed: endometrial curettings  Complications: none  Intraoperative Findings:  Normal appearing cervix, normal uterine cavity, menstrual endometrium without any discreet endometrial abnormalities, small intracervical polyp  Patient Condition: stable  Procedure in Detail:  Patient was taken to the operating room were she was administered general endotracheal anesthesia.  She was positioned in the dorsal lithotomy position utilizing Allen stirups, prepped and draped in the usual sterile fashion.  Uterus was noted to be anteverted in size, no significant enlargement noted but exam limited by habitus.   Prior to proceeding with the case a time out was performed.  Attention was turned to the patient's pelvis.  A red rubber catheter was used to empty the patient's bladder.  An operative speculum was placed to allow visualization of the cervix.  The anterior lip of the cervix was grasped with a single tooth tenaculum and the cervix was sequentially dilated using pratt dilators.  The hysteroscope was then advanced into the uterine cavity noting the above findings.  Curettage was performed using a Myosure blade and the resulting specimen collected and sent to pathology. On slowly removing the hysteroscope from the uterine cavity a small  intracervical polyp was noted and removed using the Myosure.   The single tooth tenaculum was removed from the cervix.  The tenaculum sites and cervix were noted to be  Hemostatic before removing the operative speculum.  Sponge needle and instrument counts were corrects times two.  The patient tolerated the procedure well and was taken to the recovery room in stable condition.

## 2016-11-24 NOTE — Anesthesia Post-op Follow-up Note (Signed)
Anesthesia QCDR form completed.        

## 2016-11-24 NOTE — Progress Notes (Signed)
No nausea  No pain  Wants to go home

## 2016-11-24 NOTE — Anesthesia Preprocedure Evaluation (Signed)
Anesthesia Evaluation  Patient identified by MRN, date of birth, ID band Patient awake    Reviewed: Allergy & Precautions, NPO status , Patient's Chart, lab work & pertinent test results, reviewed documented beta blocker date and time   Airway Mallampati: III  TM Distance: >3 FB     Dental  (+) Chipped   Pulmonary sleep apnea ,           Cardiovascular      Neuro/Psych    GI/Hepatic   Endo/Other  diabetes, Type 2  Renal/GU      Musculoskeletal   Abdominal   Peds  Hematology  (+) anemia ,   Anesthesia Other Findings Obese.  Reproductive/Obstetrics                             Anesthesia Physical Anesthesia Plan  ASA: III  Anesthesia Plan: General   Post-op Pain Management:    Induction: Intravenous  PONV Risk Score and Plan:   Airway Management Planned: LMA  Additional Equipment:   Intra-op Plan:   Post-operative Plan:   Informed Consent: I have reviewed the patients History and Physical, chart, labs and discussed the procedure including the risks, benefits and alternatives for the proposed anesthesia with the patient or authorized representative who has indicated his/her understanding and acceptance.     Plan Discussed with: CRNA  Anesthesia Plan Comments:         Anesthesia Quick Evaluation

## 2016-11-24 NOTE — Anesthesia Postprocedure Evaluation (Signed)
Anesthesia Post Note  Patient: Mandy Shea  Procedure(s) Performed: Procedure(s): DILATATION AND CURETTAGE /HYSTEROSCOPY  Patient location during evaluation: PACU Anesthesia Type: General Level of consciousness: awake and alert Pain management: pain level controlled Vital Signs Assessment: post-procedure vital signs reviewed and stable Respiratory status: spontaneous breathing, nonlabored ventilation, respiratory function stable and patient connected to nasal cannula oxygen Cardiovascular status: blood pressure returned to baseline and stable Postop Assessment: no apparent nausea or vomiting Anesthetic complications: no     Last Vitals:  Vitals:   11/24/16 1735 11/24/16 1800  BP: 115/82 (!) 154/82  Pulse: 77 71  Resp: 18   Temp: (!) 36.1 C   SpO2: 98% 99%    Last Pain:  Vitals:   11/24/16 1800  TempSrc:   PainSc: 0-No pain                 Denese Mentink S

## 2016-11-24 NOTE — Transfer of Care (Signed)
Immediate Anesthesia Transfer of Care Note  Patient: Mandy Shea  Procedure(s) Performed: Procedure(s): DILATATION AND CURETTAGE /HYSTEROSCOPY  Patient Location: PACU  Anesthesia Type:General  Level of Consciousness: drowsy and patient cooperative  Airway & Oxygen Therapy: Patient Spontanous Breathing and Patient connected to face mask oxygen  Post-op Assessment: Report given to RN and Post -op Vital signs reviewed and stable  Post vital signs: Reviewed and stable  Last Vitals:  Vitals:   11/24/16 1444  BP: 118/87  Pulse: 68  Resp: 18  Temp: (!) 35.6 C  SpO2: 100%    Last Pain:  Vitals:   11/24/16 1444  TempSrc: Tympanic         Complications: No apparent anesthesia complications

## 2016-11-24 NOTE — Anesthesia Procedure Notes (Signed)
Procedure Name: LMA Insertion Date/Time: 11/24/2016 4:07 PM Performed by: Silvana Newness Pre-anesthesia Checklist: Patient identified, Emergency Drugs available, Suction available, Patient being monitored and Timeout performed Patient Re-evaluated:Patient Re-evaluated prior to induction Oxygen Delivery Method: Circle system utilized Preoxygenation: Pre-oxygenation with 100% oxygen Induction Type: IV induction Ventilation: Mask ventilation without difficulty LMA: LMA inserted LMA Size: 4.0 Number of attempts: 1 Placement Confirmation: positive ETCO2 and breath sounds checked- equal and bilateral Tube secured with: Tape Dental Injury: Teeth and Oropharynx as per pre-operative assessment

## 2016-11-24 NOTE — H&P (Signed)
Obstetrics & Gynecology Surgery H&P    Chief Complaint: Scheduled Surgery   History of Present Illness: Patient is a 51 y.o. G2P2 presenting for scheduled hysteroscopic resection of submucosal myoma, discovered during work up for menorrhagia  Prior Treatments prior to proceeding with surgery include: ultrasound evalution  Review of Systems:10 point review of systems  Past Medical History:  Past Medical History:  Diagnosis Date  . Anemia    AS A CHILD  . Diabetes mellitus without complication ([place])   . Fibroids   . Hemorrhoids   . Seasonal allergies   . Sleep apnea    NO CPAP    Past Surgical History:  Past Surgical History:  Procedure Laterality Date  . CHOLECYSTECTOMY  09-26-13  . KNEE SURGERY Right   . LEEP  2010   [facility]  . TONSILLECTOMY  2014  . TUBAL LIGATION  1997    Family History:  Family History  Problem Relation Age of Onset  . Breast cancer Neg Hx     Social History:  Social History   Social History  . Marital status: Married    Spouse name: N/A  . Number of children: N/A  . Years of education: N/A   Occupational History  . Not on file.   Social History Main Topics  . Smoking status: Never Smoker  . Smokeless tobacco: Never Used  . Alcohol use No  . Drug use: No  . Sexual activity: Yes    Birth control/ protection: None   Other Topics Concern  . Not on file   Social History Narrative  . No narrative on file    Allergies:  Allergies  Allergen Reactions  . Augmentin [Amoxicillin-Pot Clavulanate] Itching    Medications: Prior to Admission medications   Medication Sig Start Date End Date Taking? Authorizing Provider  b complex vitamins tablet Take 1 tablet by mouth daily.   Yes [provider]  loratadine (CLARITIN) 10 MG tablet Take 10 mg by mouth daily.   Yes [provider]  metFORMIN (GLUCOPHAGE) 500 MG tablet Take 500 mg by mouth every evening.   Yes [provider]  montelukast (SINGULAIR) 10  MG tablet Take 10 mg by mouth daily.   Yes [provider]  VICTOZA 18 MG/3ML SOPN Inject 1.2 mg into the skin daily.  10/14/16  Yes [provider]    Physical Exam Vitals: Blood pressure 118/87, pulse 68, temperature (!) 96 F (35.6 C), temperature source Tympanic, resp. rate 18, height 5\' 4"  (1.626 m), weight 179 lb (81.2 kg), last menstrual period 11/22/2016, SpO2 100 %. General: NAD HEENT: normocephalic, anicteric Pulmonary: CTAB, No increased work of breathing Cardiovascular: RRR, distal pulses 2+ Abdomen: soft non-tender Genitourinary: deferred Extremities: no edema, erythema, or tenderness Neurologic: Grossly intact Psychiatric: mood appropriate, affect full  Imaging US Transvaginal Non-ob  Result Date: 10/28/2016 CLINICAL DATA:  Excessive premenopausal vaginal bleeding since 10/07/2016. EXAM: TRANSABDOMINAL AND TRANSVAGINAL ULTRASOUND OF PELVIS TECHNIQUE: Both transabdominal and transvaginal ultrasound examinations of the pelvis were performed. Transabdominal technique was performed for global imaging of the pelvis including uterus, ovaries, adnexal regions, and pelvic cul-de-sac. It was necessary to proceed with endovaginal exam following the transabdominal exam to visualize the ovaries, uterus and endometrium in better detail. COMPARISON:  None FINDINGS: Uterus Measurements: 10.6 x 6.6 x 5.1 cm. 3.5 x 2.7 x 2.1 cm rounded mass and the lower uterine segment, including the expected position of the endometrium. The endometrium is not visualized separate from this mass in that  area. 2.2 x 2.1 x 1.8 cm rounded mass in the posterior aspect of the mid uterus. This appears separate from the endometrium. 2.3 x 1.9 x 1.9 cm mass in the posterior aspect of the uterine fundus. This also appears separate from the endometrium. Endometrium Thickness: 8.9 mm.  No focal abnormality visualized. Right ovary Measurements: 4.2 x 2.4 x 2.3 cm. 3.5 cm cyst containing a small daughter cyst.  Left ovary Measurements: 2.5 x 1.7 x 1.5 cm. 1.6 cm follicle. Other findings No abnormal free fluid. IMPRESSION: 1. 3 uterine fibroids. The largest is in the lower uterine segment, measuring 3.5 cm in maximum diameter. This is completely obscuring the endometrium, compatible with an extensive submucosal component. 2. 3.5 cm right ovarian cyst containing a small daughter cyst. This is almost certainly benign, and no specific imaging follow up is recommended according to the Society of Radiologists in Ultrasound 2010 Consensus Conference Statement (D Clovis Riley et al. Management of Asymptomatic Ovarian and Other Adnexal Cysts Imaged at [place]: Society of Radiologists in [place] Statement 2010. Radiology 256 (Sept 2010): 295-188.). Electronically Signed   By: Claudie Revering M.D.   On: 10/28/2016 14:25   US Pelvis Complete  Result Date: 10/28/2016 CLINICAL DATA:  Excessive premenopausal vaginal bleeding since 10/07/2016. EXAM: TRANSABDOMINAL AND TRANSVAGINAL ULTRASOUND OF PELVIS TECHNIQUE: Both transabdominal and transvaginal ultrasound examinations of the pelvis were performed. Transabdominal technique was performed for global imaging of the pelvis including uterus, ovaries, adnexal regions, and pelvic cul-de-sac. It was necessary to proceed with endovaginal exam following the transabdominal exam to visualize the ovaries, uterus and endometrium in better detail. COMPARISON:  None FINDINGS: Uterus Measurements: 10.6 x 6.6 x 5.1 cm. 3.5 x 2.7 x 2.1 cm rounded mass and the lower uterine segment, including the expected position of the endometrium. The endometrium is not visualized separate from this mass in that area. 2.2 x 2.1 x 1.8 cm rounded mass in the posterior aspect of the mid uterus. This appears separate from the endometrium. 2.3 x 1.9 x 1.9 cm mass in the posterior aspect of the uterine fundus. This also appears separate from the endometrium. Endometrium Thickness: 8.9 mm.  No focal abnormality  visualized. Right ovary Measurements: 4.2 x 2.4 x 2.3 cm. 3.5 cm cyst containing a small daughter cyst. Left ovary Measurements: 2.5 x 1.7 x 1.5 cm. 1.6 cm follicle. Other findings No abnormal free fluid. IMPRESSION: 1. 3 uterine fibroids. The largest is in the lower uterine segment, measuring 3.5 cm in maximum diameter. This is completely obscuring the endometrium, compatible with an extensive submucosal component. 2. 3.5 cm right ovarian cyst containing a small daughter cyst. This is almost certainly benign, and no specific imaging follow up is recommended according to the Society of Radiologists in Ultrasound 2010 Consensus Conference Statement (D Clovis Riley et al. Management of Asymptomatic Ovarian and Other Adnexal Cysts Imaged at [place]: Society of Radiologists in [place] Statement 2010. Radiology 256 (Sept 2010): 416-606.). Electronically Signed   By: Claudie Revering M.D.   On: 10/28/2016 14:25    Assessment: 51 y.o. G2P2 presenting for scheduled hysteroscopic myomectomy  Plan: 1) I have discussed with the patient the indications for the procedure. Included in the discussion were the options of therapy, as wall as their individual risks, benefits, and complications. Ample time was given to answer all questions.   In office pipelle biopsy generally provides comparable results to [facility], however this sampling modality may miss focal lesions if these were previously documented on ultrasound.  It is because of the potential to miss focal lesions that hysteroscopy D&C is also warranted in patient with continued postmenopausal bleeding that is not self limited regardless of prior in office biopsy results or ultrasound findings.  She understands that the risk of continued observation include worsening bleeding or worsening of any underlying pathology.  The choices include: 1. Doing nothing but following her symptoms 2. Attempts at hormonal manipulation with either BCP or Depo-Provera for  premenopausal patients with no concern for focal lesion or endometrial pathology 3. D&C/hysteroscopy. 4. Endometrial ablation via Novasure or other techniques for premenopausal patients with no concern for focal lesion or endometrial pathology  5. As final resort, hysterectomy. After consideration of her history and findings, mutual decision has been made to proceed with D+C/hysteroscopy. While the incidence is low, the risks from this surgery include, but are not limited to, the risks of anesthesia, hemorrhage, infection, perforation, and injury to adjacent structures including bowel, bladder and blood vessels.    2) Routine postoperative instructions were reviewed with the patient and her family in detail today including the expected length of recovery and likely postoperative course.  The patient concurred with the proposed plan, giving informed written consent for the surgery today.  Patient instructed on the importance of being NPO after midnight prior to her procedure.  If warranted preoperative prophylactic antibiotics and SCDs ordered on call to the OR to meet SCIP guidelines and adhere to recommendation laid forth in [place] Number 104 May 2009  "Antibiotic Prophylaxis for Gynecologic Procedures".

## 2016-11-25 ENCOUNTER — Encounter: Payer: Self-pay | Admitting: Obstetrics and Gynecology

## 2016-11-28 ENCOUNTER — Encounter: Payer: Self-pay | Admitting: Obstetrics and Gynecology

## 2016-11-28 LAB — SURGICAL PATHOLOGY

## 2016-12-02 ENCOUNTER — Encounter: Payer: Self-pay | Admitting: Obstetrics and Gynecology

## 2016-12-02 ENCOUNTER — Ambulatory Visit (INDEPENDENT_AMBULATORY_CARE_PROVIDER_SITE_OTHER): Payer: BC Managed Care – PPO | Admitting: Obstetrics and Gynecology

## 2016-12-02 VITALS — BP 110/66 | HR 91 | Ht 64.0 in | Wt 176.0 lb

## 2016-12-02 DIAGNOSIS — Z4889 Encounter for other specified surgical aftercare: Secondary | ICD-10-CM

## 2016-12-02 MED ORDER — NORETHINDRONE ACETATE 5 MG PO TABS: 5.0000 mg | ORAL_TABLET | Freq: Every day | ORAL | 11 refills | Status: DC

## 2016-12-02 NOTE — Progress Notes (Signed)
      Postoperative Follow-up Patient presents post op from hysteroscopy, D&C 1weeks ago for abnormal uterine bleeding and fibroids.  Subjective: Patient reports no improvement in her preop symptoms. Eating a regular diet without difficulty. The patient is not having any pain.  Activity: normal activities of daily living.  Objective: Vitals:   12/02/16 1511  BP: 110/66  Pulse: 91     Assessment: 51 y.o. s/p hysteroscopy, D&C stable  Plan: Patient has done well after surgery with no apparent complications.  I have discussed the post-operative course to date, and the expected progress moving forward.  The patient understands what complications to be concerned about.  I will see the patient in routine follow up, or sooner if needed.    Activity plan: No restriction.  Trial norethindrone  Other options hyst, [place], or leupron discussed given that fibroid is intramural as opposed to submucosal.    Malachy Mood 12/04/2016, 9:36 PM

## 2016-12-06 ENCOUNTER — Encounter: Payer: Self-pay | Admitting: Obstetrics and Gynecology

## 2016-12-09 ENCOUNTER — Encounter: Payer: Self-pay | Admitting: Obstetrics and Gynecology

## 2016-12-12 ENCOUNTER — Telehealth: Payer: Self-pay | Admitting: Obstetrics and Gynecology

## 2016-12-12 NOTE — Telephone Encounter (Signed)
-----   Message from Malachy Mood, MD sent at 12/09/2016  4:19 PM EDT ----- Regarding: surgery Surgery Date:   LOS: observation  Surgery Booking Request Patient Full Name: Mandy Shea MRN: [id number]  DOB: 04-02-1965  Surgeon: Malachy Mood, MD  Requested Surgery Date and Time: patient preference Primary Diagnosis and Code: Abnormal uterine bleeding Secondary Diagnosis and Code: Uterine fibroids Surgical Procedure: TLH/BS and cystoscopy L&D Notification:N/A Admission Status: observation Length of Surgery: 2.5h Special Case Needs: none H&P: day of surgery (date) Phone Interview or Office Pre-Admit: anesthesia preference Interpreter: none Language: English Medical Clearance: none Special Scheduling Instructions: N/A

## 2016-12-12 NOTE — Telephone Encounter (Signed)
Patient is aware of H&P and consents day of surgery, Pre-admit Testing phone interview, and OR on 01/12/17. Ext given.

## 2016-12-12 NOTE — Telephone Encounter (Signed)
Patient needs a letter stating the date of surgery, expected recovery time, and the recovery time if the procedure is not able to be accomplished laparoscopically. Please make the letter available in [employer] so the patient can print it for her employer. Thanks.

## 2016-12-13 ENCOUNTER — Encounter: Payer: Self-pay | Admitting: Obstetrics and Gynecology

## 2016-12-13 DIAGNOSIS — B977 Papillomavirus as the cause of diseases classified elsewhere: Secondary | ICD-10-CM | POA: Insufficient documentation

## 2016-12-13 DIAGNOSIS — N72 Inflammatory disease of cervix uteri: Secondary | ICD-10-CM

## 2016-12-13 NOTE — Telephone Encounter (Signed)
It should be filed under the letters tab of her chart

## 2016-12-13 NOTE — Telephone Encounter (Signed)
Patient is aware of Pre-admit Testing phone interview and will check MyChart to see if she can retrieve the letter.

## 2016-12-20 ENCOUNTER — Telehealth: Payer: Self-pay

## 2016-12-20 NOTE — Telephone Encounter (Signed)
FMLA/DISABILITY form for [employer] filled out and given to [place] for processing.

## 2016-12-28 ENCOUNTER — Encounter: Payer: Self-pay | Admitting: Obstetrics and Gynecology

## 2017-01-04 ENCOUNTER — Other Ambulatory Visit: Payer: Self-pay

## 2017-01-04 ENCOUNTER — Encounter
Admission: RE | Admit: 2017-01-04 | Discharge: 2017-01-04 | Disposition: A | Discharge status: Home / Self Care | Payer: BC Managed Care – PPO | Source: Ambulatory Visit | Attending: Obstetrics and Gynecology | Admitting: Obstetrics and Gynecology

## 2017-01-04 NOTE — Patient Instructions (Signed)
  Your procedure is scheduled on: 01-12-17 THURSDAY Report to Same Day Surgery 2nd floor medical mall [facility] Entrance-take elevator on left to 2nd floor.  Check in with surgery information desk.) To find out your arrival time please call [phone] between 1PM - 3PM on 01-11-17 [facility]  Remember: Instructions that are not followed completely may result in serious medical risk, up to and including death, or upon the discretion of your surgeon and anesthesiologist your surgery may need to be rescheduled.    _x___ 1. Do not eat food after midnight the night before your procedure. NO GUM CHEWING OR CANDY AFTER MIDNIGHT.  You may drink WATER up to 2 hours before you are scheduled to arrive at the hospital for your procedure.  Do not drink WATER within 2 hours of your scheduled arrival to the hospital.  Type 1 and type 2 diabetics should only drink water.     __x__ 2. No Alcohol for 24 hours before or after surgery.   __x__3. No Smoking for 24 prior to surgery.   ____  4. Bring all medications with you on the day of surgery if instructed.    __x__ 5. Notify your doctor if there is any change in your medical condition     (cold, fever, infections).     Do not wear jewelry, make-up, hairpins, clips or nail polish.  Do not wear lotions, powders, or perfumes. You may wear deodorant.  Do not shave 48 hours prior to surgery. Men may shave face and neck.  Do not bring valuables to the hospital.    [facility] is not responsible for any belongings or valuables.               Contacts, dentures or bridgework may not be worn into surgery.  Leave your suitcase in the car. After surgery it may be brought to your room.  For patients admitted to the hospital, discharge time is determined by your treatment team.   Patients discharged the day of surgery will not be allowed to drive home.  You will need someone to drive you home and stay with you the night of your procedure.    Please read over  the following fact sheets that you were given:    ____ Take anti-hypertensive listed below, cardiac, seizure, asthma, anti-reflux and psychiatric medicines. These include:  1. NONE  2.  3.  4.  5.  6.  ____Fleets enema or Magnesium Citrate as directed.   _x___ Use CHG Soap or sage wipes as directed on instruction sheet   ____ Use inhalers on the day of surgery and bring to hospital day of surgery  _X___ Stop Metformin 2 days prior to surgery-LAST DOSE OF METFORMIN ON Monday, November 12TH   ____ Take 1/2 of usual insulin dose the night before surgery and none on the morning surgery.   ____ Follow recommendations from Cardiologist, Pulmonologist or PCP regarding stopping Aspirin, Coumadin, Plavix ,Eliquis, Effient, or Pradaxa, and Pletal.  X____Stop Anti-inflammatories such as Advil, Aleve, Ibuprofen, Motrin, Naproxen, Naprosyn, Goodies powders or aspirin products NOW-OK to take Tylenol   ____ Stop supplements until after surgery.     ____ Bring C-Pap to the hospital.

## 2017-01-09 ENCOUNTER — Encounter
Admission: RE | Admit: 2017-01-09 | Discharge: 2017-01-09 | Disposition: A | Discharge status: Home / Self Care | Payer: BC Managed Care – PPO | Source: Ambulatory Visit | Attending: Obstetrics and Gynecology | Admitting: Obstetrics and Gynecology

## 2017-01-09 DIAGNOSIS — D259 Leiomyoma of uterus, unspecified: Secondary | ICD-10-CM | POA: Diagnosis not present

## 2017-01-09 DIAGNOSIS — G4730 Sleep apnea, unspecified: Secondary | ICD-10-CM | POA: Diagnosis not present

## 2017-01-09 DIAGNOSIS — Z79899 Other long term (current) drug therapy: Secondary | ICD-10-CM | POA: Diagnosis not present

## 2017-01-09 DIAGNOSIS — N72 Inflammatory disease of cervix uteri: Secondary | ICD-10-CM | POA: Diagnosis not present

## 2017-01-09 DIAGNOSIS — N946 Dysmenorrhea, unspecified: Secondary | ICD-10-CM | POA: Diagnosis not present

## 2017-01-09 DIAGNOSIS — N838 Other noninflammatory disorders of ovary, fallopian tube and broad ligament: Secondary | ICD-10-CM | POA: Diagnosis not present

## 2017-01-09 DIAGNOSIS — N920 Excessive and frequent menstruation with regular cycle: Secondary | ICD-10-CM | POA: Diagnosis not present

## 2017-01-09 DIAGNOSIS — E119 Type 2 diabetes mellitus without complications: Secondary | ICD-10-CM | POA: Diagnosis not present

## 2017-01-09 DIAGNOSIS — Z880 Allergy status to penicillin: Secondary | ICD-10-CM | POA: Diagnosis not present

## 2017-01-09 DIAGNOSIS — N840 Polyp of corpus uteri: Secondary | ICD-10-CM | POA: Diagnosis not present

## 2017-01-09 DIAGNOSIS — Z7984 Long term (current) use of oral hypoglycemic drugs: Secondary | ICD-10-CM | POA: Diagnosis not present

## 2017-01-09 LAB — BASIC METABOLIC PANEL
Anion gap: 8 (ref 5–15)
BUN: 13 mg/dL (ref 6–20)
CALCIUM: 9.2 mg/dL (ref 8.9–10.3)
CHLORIDE: 104 mmol/L (ref 101–111)
CO2: 26 mmol/L (ref 22–32)
Creatinine, Ser: 0.89 mg/dL (ref 0.44–1.00)
GFR calc non Af Amer: >60 mL/min (ref >60)
GLUCOSE: 57 mg/dL — AB (ref 65–99)
POTASSIUM: 3.5 mmol/L (ref 3.5–5.1)
SODIUM: 138 mmol/L (ref 135–145)

## 2017-01-09 LAB — TYPE AND SCREEN
ABO/RH(D): B POS
Antibody Screen: NEGATIVE

## 2017-01-09 LAB — CBC
HCT: 30.5 % — ABNORMAL LOW (ref 35.0–47.0)
HEMOGLOBIN: 10.3 g/dL — AB (ref 12.0–16.0)
MCH: 30.7 pg (ref 26.0–34.0)
MCHC: 33.7 g/dL (ref 32.0–36.0)
MCV: 90.9 fL (ref 80.0–100.0)
Platelets: 556 10*3/uL — ABNORMAL HIGH (ref 150–440)
RBC: 3.36 MIL/uL — ABNORMAL LOW (ref 3.80–5.20)
RDW: 12.3 % (ref 11.5–14.5)
WBC: 6.6 10*3/uL (ref 3.6–11.0)

## 2017-01-10 ENCOUNTER — Telehealth: Payer: Self-pay

## 2017-01-10 NOTE — Telephone Encounter (Signed)
FMLA/DISABILITY form for [employer] filled out and given to [place] for processing.

## 2017-01-12 ENCOUNTER — Ambulatory Visit: Payer: BC Managed Care – PPO | Admitting: Anesthesiology

## 2017-01-12 ENCOUNTER — Encounter
Admission: RE | Disposition: A | Discharge status: Home / Self Care | Payer: Self-pay | Source: Ambulatory Visit | Attending: Obstetrics and Gynecology

## 2017-01-12 ENCOUNTER — Other Ambulatory Visit: Payer: Self-pay

## 2017-01-12 ENCOUNTER — Observation Stay
Admission: RE | Admit: 2017-01-12 | Discharge: 2017-01-13 | Disposition: A | Discharge status: Home / Self Care | Payer: BC Managed Care – PPO | Source: Ambulatory Visit | Attending: Obstetrics and Gynecology | Admitting: Obstetrics and Gynecology

## 2017-01-12 DIAGNOSIS — G4730 Sleep apnea, unspecified: Secondary | ICD-10-CM | POA: Insufficient documentation

## 2017-01-12 DIAGNOSIS — N946 Dysmenorrhea, unspecified: Secondary | ICD-10-CM | POA: Insufficient documentation

## 2017-01-12 DIAGNOSIS — N920 Excessive and frequent menstruation with regular cycle: Principal | ICD-10-CM | POA: Insufficient documentation

## 2017-01-12 DIAGNOSIS — N72 Inflammatory disease of cervix uteri: Secondary | ICD-10-CM | POA: Insufficient documentation

## 2017-01-12 DIAGNOSIS — D259 Leiomyoma of uterus, unspecified: Secondary | ICD-10-CM | POA: Insufficient documentation

## 2017-01-12 DIAGNOSIS — Z880 Allergy status to penicillin: Secondary | ICD-10-CM | POA: Insufficient documentation

## 2017-01-12 DIAGNOSIS — N838 Other noninflammatory disorders of ovary, fallopian tube and broad ligament: Secondary | ICD-10-CM | POA: Insufficient documentation

## 2017-01-12 DIAGNOSIS — Z90710 Acquired absence of both cervix and uterus: Secondary | ICD-10-CM | POA: Diagnosis present

## 2017-01-12 DIAGNOSIS — Z7984 Long term (current) use of oral hypoglycemic drugs: Secondary | ICD-10-CM | POA: Insufficient documentation

## 2017-01-12 DIAGNOSIS — E119 Type 2 diabetes mellitus without complications: Secondary | ICD-10-CM | POA: Insufficient documentation

## 2017-01-12 DIAGNOSIS — Z79899 Other long term (current) drug therapy: Secondary | ICD-10-CM | POA: Insufficient documentation

## 2017-01-12 DIAGNOSIS — N840 Polyp of corpus uteri: Secondary | ICD-10-CM | POA: Insufficient documentation

## 2017-01-12 HISTORY — PX: CYSTOSCOPY: SHX5120

## 2017-01-12 HISTORY — PX: TOTAL LAPAROSCOPIC HYSTERECTOMY WITH SALPINGECTOMY: SHX6742

## 2017-01-12 LAB — BASIC METABOLIC PANEL
Anion gap: 8 (ref 5–15)
BUN: 17 mg/dL (ref 6–20)
CO2: 23 mmol/L (ref 22–32)
CREATININE: 0.78 mg/dL (ref 0.44–1.00)
Calcium: 8.8 mg/dL — ABNORMAL LOW (ref 8.9–10.3)
Chloride: 105 mmol/L (ref 101–111)
GFR calc Af Amer: >60 mL/min (ref >60)
Glucose, Bld: 105 mg/dL — ABNORMAL HIGH (ref 65–99)
POTASSIUM: 3.8 mmol/L (ref 3.5–5.1)
SODIUM: 136 mmol/L (ref 135–145)

## 2017-01-12 LAB — CBC
HCT: 26.6 % — ABNORMAL LOW (ref 35.0–47.0)
Hemoglobin: 9.1 g/dL — ABNORMAL LOW (ref 12.0–16.0)
MCH: 30.5 pg (ref 26.0–34.0)
MCHC: 34.1 g/dL (ref 32.0–36.0)
MCV: 89.3 fL (ref 80.0–100.0)
PLATELETS: 504 10*3/uL — AB (ref 150–440)
RBC: 2.98 MIL/uL — AB (ref 3.80–5.20)
RDW: 12.6 % (ref 11.5–14.5)
WBC: 4.9 10*3/uL (ref 3.6–11.0)

## 2017-01-12 LAB — GLUCOSE, CAPILLARY
GLUCOSE-CAPILLARY: 135 mg/dL — AB (ref 65–99)
GLUCOSE-CAPILLARY: 166 mg/dL — AB (ref 65–99)
GLUCOSE-CAPILLARY: 158 mg/dL — AB (ref 65–99)
Glucose-Capillary: 140 mg/dL — ABNORMAL HIGH (ref 65–99)
Glucose-Capillary: 163 mg/dL — ABNORMAL HIGH (ref 65–99)
Glucose-Capillary: 97 mg/dL (ref 65–99)

## 2017-01-12 LAB — ABO/RH: ABO/RH(D): B POS

## 2017-01-12 LAB — POCT PREGNANCY, URINE: Preg Test, Ur: NEGATIVE

## 2017-01-12 SURGERY — HYSTERECTOMY, TOTAL, LAPAROSCOPIC, WITH SALPINGECTOMY
Anesthesia: General | Laterality: Right

## 2017-01-12 MED ORDER — GENTAMICIN SULFATE 40 MG/ML IJ SOLN
320.0000 mg | Freq: Once | INTRAMUSCULAR | Status: AC
  Administered 2017-01-12: 80 mg via INTRAVENOUS
  Filled 2017-01-12: qty 8

## 2017-01-12 MED ORDER — FENTANYL CITRATE (PF) 100 MCG/2ML IJ SOLN
INTRAMUSCULAR | Status: AC
  Administered 2017-01-12: 25 ug via INTRAVENOUS
  Filled 2017-01-12: qty 2

## 2017-01-12 MED ORDER — KETOROLAC TROMETHAMINE 30 MG/ML IJ SOLN
30.0000 mg | Freq: Four times a day (QID) | INTRAMUSCULAR | Status: DC
  Administered 2017-01-12 – 2017-01-13 (×4): 30 mg via INTRAVENOUS
  Filled 2017-01-12 (×5): qty 1

## 2017-01-12 MED ORDER — MIDAZOLAM HCL 2 MG/2ML IJ SOLN
INTRAMUSCULAR | Status: AC
  Filled 2017-01-12: qty 2

## 2017-01-12 MED ORDER — DEXAMETHASONE SODIUM PHOSPHATE 10 MG/ML IJ SOLN
INTRAMUSCULAR | Status: AC
  Filled 2017-01-12: qty 1

## 2017-01-12 MED ORDER — OXYCODONE HCL 5 MG PO TABS
5.0000 mg | ORAL_TABLET | Freq: Once | ORAL | Status: AC | PRN
  Administered 2017-01-12: 5 mg via ORAL

## 2017-01-12 MED ORDER — SUGAMMADEX SODIUM 200 MG/2ML IV SOLN
INTRAVENOUS | Status: DC | PRN
  Administered 2017-01-12: 150 mg via INTRAVENOUS

## 2017-01-12 MED ORDER — DEXAMETHASONE SODIUM PHOSPHATE 10 MG/ML IJ SOLN
INTRAMUSCULAR | Status: DC | PRN
  Administered 2017-01-12: 10 mg via INTRAVENOUS

## 2017-01-12 MED ORDER — GENTAMICIN SULFATE 40 MG/ML IJ SOLN
INTRAVENOUS | Status: DC
  Filled 2017-01-12: qty 8

## 2017-01-12 MED ORDER — EPHEDRINE SULFATE 50 MG/ML IJ SOLN
INTRAMUSCULAR | Status: DC | PRN
Start: 2017-01-12 — End: 2017-01-12
  Administered 2017-01-12 (×2): 10 mg via INTRAVENOUS

## 2017-01-12 MED ORDER — FLUORESCEIN SODIUM 10 % IV SOLN
INTRAVENOUS | Status: DC | PRN
  Administered 2017-01-12: 50 mg via INTRAVENOUS

## 2017-01-12 MED ORDER — SODIUM CHLORIDE 0.9 % IV SOLN
INTRAVENOUS | Status: DC
  Administered 2017-01-12: 07:00:00 via INTRAVENOUS

## 2017-01-12 MED ORDER — SUCCINYLCHOLINE CHLORIDE 20 MG/ML IJ SOLN
INTRAMUSCULAR | Status: AC
  Filled 2017-01-12: qty 1

## 2017-01-12 MED ORDER — FAMOTIDINE 20 MG PO TABS
20.0000 mg | ORAL_TABLET | Freq: Once | ORAL | Status: AC
  Administered 2017-01-12: 20 mg via ORAL

## 2017-01-12 MED ORDER — ONDANSETRON HCL 4 MG/2ML IJ SOLN
INTRAMUSCULAR | Status: DC | PRN
Start: 2017-01-12 — End: 2017-01-12
  Administered 2017-01-12: 4 mg via INTRAVENOUS

## 2017-01-12 MED ORDER — OXYCODONE HCL 5 MG/5ML PO SOLN: 5.0000 mg | Freq: Once | ORAL | Status: AC | PRN

## 2017-01-12 MED ORDER — CLINDAMYCIN PHOSPHATE 900 MG/50ML IV SOLN
INTRAVENOUS | Status: AC
  Filled 2017-01-12: qty 50

## 2017-01-12 MED ORDER — FLUORESCEIN SODIUM 10 % IV SOLN
INTRAVENOUS | Status: AC
  Filled 2017-01-12: qty 5

## 2017-01-12 MED ORDER — FENTANYL CITRATE (PF) 100 MCG/2ML IJ SOLN
25.0000 ug | INTRAMUSCULAR | Status: AC | PRN
  Administered 2017-01-12 (×6): 25 ug via INTRAVENOUS

## 2017-01-12 MED ORDER — OXYCODONE HCL 5 MG PO TABS
ORAL_TABLET | ORAL | Status: AC
  Filled 2017-01-12: qty 1

## 2017-01-12 MED ORDER — MIDAZOLAM HCL 2 MG/2ML IJ SOLN
INTRAMUSCULAR | Status: DC | PRN
  Administered 2017-01-12: 2 mg via INTRAVENOUS

## 2017-01-12 MED ORDER — MORPHINE SULFATE (PF) 2 MG/ML IV SOLN: 1.0000 mg | INTRAVENOUS | Status: DC | PRN

## 2017-01-12 MED ORDER — PROPOFOL 10 MG/ML IV BOLUS
INTRAVENOUS | Status: DC | PRN
  Administered 2017-01-12: 160 mg via INTRAVENOUS

## 2017-01-12 MED ORDER — SODIUM CHLORIDE 0.9 % IV SOLN
INTRAVENOUS | Status: DC
  Administered 2017-01-12 – 2017-01-13 (×3): via INTRAVENOUS

## 2017-01-12 MED ORDER — ONDANSETRON HCL 4 MG/2ML IJ SOLN
INTRAMUSCULAR | Status: AC
  Filled 2017-01-12: qty 2

## 2017-01-12 MED ORDER — CEFAZOLIN SODIUM-DEXTROSE 2-4 GM/100ML-% IV SOLN: 2.0000 g | INTRAVENOUS | Status: DC

## 2017-01-12 MED ORDER — BUPIVACAINE HCL (PF) 0.5 % IJ SOLN
INTRAMUSCULAR | Status: AC
  Filled 2017-01-12: qty 30

## 2017-01-12 MED ORDER — KETOROLAC TROMETHAMINE 30 MG/ML IJ SOLN: 30.0000 mg | Freq: Four times a day (QID) | INTRAMUSCULAR | Status: DC

## 2017-01-12 MED ORDER — MENTHOL 3 MG MT LOZG
1.0000 | LOZENGE | OROMUCOSAL | Status: DC | PRN
  Filled 2017-01-12: qty 9

## 2017-01-12 MED ORDER — GENTAMICIN IN SALINE 1.6-0.9 MG/ML-% IV SOLN: 80.0000 mg | Freq: Once | INTRAVENOUS | Status: DC

## 2017-01-12 MED ORDER — SUGAMMADEX SODIUM 200 MG/2ML IV SOLN
INTRAVENOUS | Status: AC
  Filled 2017-01-12: qty 2

## 2017-01-12 MED ORDER — LIDOCAINE HCL (PF) 2 % IJ SOLN
INTRAMUSCULAR | Status: AC
  Filled 2017-01-12: qty 10

## 2017-01-12 MED ORDER — PROPOFOL 10 MG/ML IV BOLUS
INTRAVENOUS | Status: AC
  Filled 2017-01-12: qty 20

## 2017-01-12 MED ORDER — ROCURONIUM BROMIDE 100 MG/10ML IV SOLN
INTRAVENOUS | Status: DC | PRN
  Administered 2017-01-12: 50 mg via INTRAVENOUS
  Administered 2017-01-12 (×2): 10 mg via INTRAVENOUS

## 2017-01-12 MED ORDER — OXYCODONE-ACETAMINOPHEN 5-325 MG PO TABS
1.0000 | ORAL_TABLET | ORAL | Status: DC | PRN
  Administered 2017-01-12: 2 via ORAL
  Filled 2017-01-12: qty 2

## 2017-01-12 MED ORDER — FENTANYL CITRATE (PF) 100 MCG/2ML IJ SOLN
INTRAMUSCULAR | Status: DC | PRN
  Administered 2017-01-12 (×2): 50 ug via INTRAVENOUS

## 2017-01-12 MED ORDER — CLINDAMYCIN PHOSPHATE 900 MG/50ML IV SOLN
900.0000 mg | Freq: Once | INTRAVENOUS | Status: AC
  Administered 2017-01-12: 900 mg via INTRAVENOUS

## 2017-01-12 MED ORDER — FENTANYL CITRATE (PF) 100 MCG/2ML IJ SOLN
INTRAMUSCULAR | Status: AC
  Filled 2017-01-12: qty 2

## 2017-01-12 MED ORDER — BUPIVACAINE HCL 0.5 % IJ SOLN
INTRAMUSCULAR | Status: DC | PRN
  Administered 2017-01-12: 21 mL

## 2017-01-12 MED ORDER — LACTATED RINGERS IV SOLN
INTRAVENOUS | Status: DC | PRN
  Administered 2017-01-12: 08:00:00 via INTRAVENOUS

## 2017-01-12 MED ORDER — FAMOTIDINE 20 MG PO TABS
ORAL_TABLET | ORAL | Status: AC
  Administered 2017-01-12: 20 mg via ORAL
  Filled 2017-01-12: qty 1

## 2017-01-12 MED ORDER — INSULIN ASPART 100 UNIT/ML ~~LOC~~ SOLN
0.0000 [IU] | SUBCUTANEOUS | Status: DC
  Administered 2017-01-12 (×2): 2 [IU] via SUBCUTANEOUS
  Administered 2017-01-12: 4 [IU] via SUBCUTANEOUS
  Administered 2017-01-13: 2 [IU] via SUBCUTANEOUS
  Administered 2017-01-13: 0 [IU] via SUBCUTANEOUS
  Filled 2017-01-12 (×5): qty 1

## 2017-01-12 MED ORDER — LIDOCAINE HCL (CARDIAC) 20 MG/ML IV SOLN
INTRAVENOUS | Status: DC | PRN
  Administered 2017-01-12: 80 mg via INTRAVENOUS

## 2017-01-12 SURGICAL SUPPLY — 50 items
BAG URO DRAIN 2000ML W/SPOUT (MISCELLANEOUS) ×4 IMPLANT
BLADE SURG SZ11 CARB STEEL (BLADE) ×4 IMPLANT
CANISTER SUCT 1200ML W/VALVE (MISCELLANEOUS) ×4 IMPLANT
CATH FOLEY 2WAY  5CC 16FR (CATHETERS) ×4
CATH ROBINSON RED A/P 16FR (CATHETERS) ×4 IMPLANT
CATH URTH 16FR FL 2W BLN LF (CATHETERS) ×4 IMPLANT
CHLORAPREP W/TINT 26ML (MISCELLANEOUS) ×4 IMPLANT
COUNTER NEEDLE 20/40 LG (NEEDLE) ×4 IMPLANT
DEFOGGER SCOPE WARMER CLEARIFY (MISCELLANEOUS) ×4 IMPLANT
DERMABOND ADVANCED (GAUZE/BANDAGES/DRESSINGS) ×2
DERMABOND ADVANCED .7 DNX12 (GAUZE/BANDAGES/DRESSINGS) ×2 IMPLANT
DRAPE UNDER BUTTOCK W/FLU (DRAPES) ×4 IMPLANT
GLOVE BIO SURGEON STRL SZ7 (GLOVE) ×16 IMPLANT
GLOVE INDICATOR 7.5 STRL GRN (GLOVE) ×16 IMPLANT
GOWN STRL REUS W/ TWL LRG LVL3 (GOWN DISPOSABLE) ×4 IMPLANT
GOWN STRL REUS W/ TWL XL LVL3 (GOWN DISPOSABLE) ×2 IMPLANT
GOWN STRL REUS W/TWL LRG LVL3 (GOWN DISPOSABLE) ×4
GOWN STRL REUS W/TWL XL LVL3 (GOWN DISPOSABLE) ×2
IRRIGATION STRYKERFLOW (MISCELLANEOUS) ×2 IMPLANT
IRRIGATOR STRYKERFLOW (MISCELLANEOUS) ×4
IV LACTATED RINGERS 1000ML (IV SOLUTION) ×4 IMPLANT
IV NS 1000ML (IV SOLUTION) ×2
IV NS 1000ML BAXH (IV SOLUTION) ×2 IMPLANT
KIT PINK PAD W/HEAD ARE REST (MISCELLANEOUS) ×4
KIT PINK PAD W/HEAD ARM REST (MISCELLANEOUS) ×2 IMPLANT
LABEL OR SOLS (LABEL) ×4 IMPLANT
MANIPULATOR VCARE LG CRV RETR (MISCELLANEOUS) ×4 IMPLANT
MANIPULATOR VCARE SML CRV RETR (MISCELLANEOUS) IMPLANT
MANIPULATOR VCARE STD CRV RETR (MISCELLANEOUS) IMPLANT
NS IRRIG 1000ML POUR BTL (IV SOLUTION) ×4 IMPLANT
NS IRRIG 500ML POUR BTL (IV SOLUTION) ×4 IMPLANT
OCCLUDER COLPOPNEUMO (BALLOONS) IMPLANT
PACK GYN LAPAROSCOPIC (MISCELLANEOUS) ×4 IMPLANT
PAD OB MATERNITY 4.3X12.25 (PERSONAL CARE ITEMS) ×4 IMPLANT
PAD PREP 24X41 OB/GYN DISP (PERSONAL CARE ITEMS) ×4 IMPLANT
SET CYSTO W/LG BORE CLAMP LF (SET/KITS/TRAYS/PACK) ×4 IMPLANT
SHEARS HARMONIC ACE PLUS 36CM (ENDOMECHANICALS) ×4 IMPLANT
SLEEVE ENDOPATH XCEL 5M (ENDOMECHANICALS) ×8 IMPLANT
SPONGE LAP 18X18 5 PK (GAUZE/BANDAGES/DRESSINGS) IMPLANT
SPONGE XRAY 4X4 16PLY STRL (MISCELLANEOUS) ×4 IMPLANT
STRAP SAFETY BODY (MISCELLANEOUS) ×4 IMPLANT
SURGILUBE 2OZ TUBE FLIPTOP (MISCELLANEOUS) ×4 IMPLANT
SUT VIC AB 0 CT1 27 (SUTURE) ×2
SUT VIC AB 0 CT1 27XCR 8 STRN (SUTURE) ×2 IMPLANT
SUT VIC AB 2-0 UR6 27 (SUTURE) ×4 IMPLANT
SUT VIC AB 4-0 FS2 27 (SUTURE) IMPLANT
SYR 50ML LL SCALE MARK (SYRINGE) ×4 IMPLANT
SYRINGE 10CC LL (SYRINGE) ×4 IMPLANT
TROCAR XCEL NON-BLD 5MMX100MML (ENDOMECHANICALS) ×4 IMPLANT
TUBING INSUF HEATED (TUBING) ×4 IMPLANT

## 2017-01-12 NOTE — Anesthesia Procedure Notes (Signed)
Procedure Name: Intubation Date/Time: 01/12/2017 7:45 AM Performed by: Allean Found, CRNA Pre-anesthesia Checklist: Patient identified, Emergency Drugs available, Suction available, Patient being monitored and Timeout performed Patient Re-evaluated:Patient Re-evaluated prior to induction Oxygen Delivery Method: Circle system utilized Preoxygenation: Pre-oxygenation with 100% oxygen Induction Type: IV induction Ventilation: Mask ventilation without difficulty Laryngoscope Size: Mac and 3 Grade View: Grade I Tube type: Oral Tube size: 7.0 mm Number of attempts: 1 Airway Equipment and Method: Stylet Placement Confirmation: ETT inserted through vocal cords under direct vision,  positive ETCO2 and breath sounds checked- equal and bilateral Secured at: 21 cm Tube secured with: Tape Dental Injury: Teeth and Oropharynx as per pre-operative assessment

## 2017-01-12 NOTE — H&P (Signed)
Obstetrics & Gynecology Surgery H&P    Chief Complaint: Scheduled Surgery   History of Present Illness: Patient is a 51 y.o. G2P2 presenting for scheduled TLH, BS, cysto, for the treatment or further evaluation of menorrhagia, dysmenorrhea, uterine fibroid.   Prior Treatments prior to proceeding with surgery include: hysteroscopy D&C  Preoperative Pap: 09/16/2013 Results: no abnormalities  Preoperative Endometrial biopsy: 11/24/16 Findings: pathology proliferative endometrium Preoperative Ultrasound:Findings: 10/28/16 lower uterine segment fibroid 3.5cm   Review of Systems:10 point review of systems  Past Medical History:  Past Medical History:  Diagnosis Date  . Anemia    AS A CHILD  . Diabetes mellitus without complication ([place])   . Fibroids   . Hemorrhoids   . Seasonal allergies   . Sleep apnea    NO CPAP    Past Surgical History:  Past Surgical History:  Procedure Laterality Date  . CHOLECYSTECTOMY  09-26-13  . HYSTEROSCOPY W/D&C  11/24/2016   Procedure: DILATATION AND CURETTAGE /HYSTEROSCOPY;  Surgeon: Malachy Mood, MD;  Location: [facility] ORS;  Service: Gynecology;;  . KNEE SURGERY Right   . LEEP  2010   [facility]  . TONSILLECTOMY  2014  . TUBAL LIGATION  1997    Family History:  Family History  Problem Relation Age of Onset  . Breast cancer Neg Hx     Social History:  Social History   Socioeconomic History  . Marital status: Married    Spouse name: Not on file  . Number of children: Not on file  . Years of education: Not on file  . Highest education level: Not on file  Social Needs  . Financial resource strain: Not on file  . Food insecurity - worry: Not on file  . Food insecurity - inability: Not on file  . Transportation needs - medical: Not on file  . Transportation needs - non-medical: Not on file  Occupational History  . Not on file  Tobacco Use  . Smoking status: Never Smoker  . Smokeless tobacco: Never Used  Substance and Sexual  Activity  . Alcohol use: No  . Drug use: No  . Sexual activity: Yes    Birth control/protection: None  Other Topics Concern  . Not on file  Social History Narrative  . Not on file    Allergies:  Allergies  Allergen Reactions  . Augmentin [Amoxicillin-Pot Clavulanate] Itching    Medications: Prior to Admission medications   Medication Sig Start Date End Date Taking? Authorizing Provider  b complex vitamins tablet Take 1 tablet by mouth daily.   Yes [provider]  loratadine (CLARITIN) 10 MG tablet Take 10 mg by mouth daily.   Yes [provider]  montelukast (SINGULAIR) 10 MG tablet Take 10 mg by mouth daily.   Yes [provider]  VICTOZA 18 MG/3ML SOPN Inject 1.2 mg into the skin daily.  10/14/16  Yes [provider]  metFORMIN (GLUCOPHAGE) 500 MG tablet Take 500 mg by mouth every evening.    [provider]  norethindrone (AYGESTIN) 5 MG tablet Take 1 tablet (5 mg total) by mouth daily. Patient not taking: Reported on 01/04/2017 12/02/16   Malachy Mood, MD    Physical Exam Vitals: Blood pressure 126/75, pulse 64, temperature 97.7 F (36.5 C), temperature source Oral, resp. rate 18, height 5\' 4"  (1.626 m), weight 174 lb (78.9 kg), last menstrual period 12/28/2016, SpO2 100 %. General: NAD HEENT: normocephalic, anicteric Pulmonary: CTAB, No increased work of breathing Cardiovascular: RRR, distal pulses 2+ Abdomen:  soft, non-tender, non-distended Genitourinary: deferred Extremities: no edema, erythema, or tenderness Neurologic: Grossly intact Psychiatric: mood appropriate, affect full  Imaging No results found.  Assessment: 51 y.o. G2P2 presenting for scheduled TLH, BS, cystoscopy  Plan: 1) Patient opts for definitive surgical management via hysterectomy. The risks of surgery were discussed in detail with the patient including but not limited to: bleeding which may require transfusion or reoperation; infection which may  require antibiotics; injury to bowel, bladder, ureters or other surrounding organs (With a literature reported rate of urinary tract injury of 1% quoted); need for additional procedures including laparotomy; thromboembolic phenomenon, incisional problems and other postoperative/anesthesia complications.  Patient was also advised that recovery procedure generally involves an overnight stay; and the  expected recovery time after a hysterectomy being in the range of 6-8 weeks.  Likelihood of success in alleviating the patient's symptoms was discussed.  While definitive in regards to issues with menstural bleeding, pelvic pain if present preoperatively may continue and in fact worsen postoperatively.  She is aware that the procedure will render her unable to pursue childbearing in the future.   She was told that she will be contacted by our surgical scheduler regarding the time and date of her surgery; routine preoperative instructions of having nothing to eat or drink after midnight on the day prior to surgery and also coming to the hospital 1.5 hours prior to her time of surgery were also emphasized.  She was told she may be called for a preoperative appointment about a week prior to surgery and will be given further preoperative instructions at that visit.  Routine postoperative instructions will be reviewed with the patient and her family in detail after surgery. Printed patient education handouts about the procedure was given to the patient to review at home.  2) Routine postoperative instructions were reviewed with the patient and her family in detail today including the expected length of recovery and likely postoperative course.  The patient concurred with the proposed plan, giving informed written consent for the surgery today.  Patient instructed on the importance of being NPO after midnight prior to her procedure.  If warranted preoperative prophylactic antibiotics and SCDs ordered on call to the OR to meet  SCIP guidelines and adhere to recommendation laid forth in [place] Number 104 May 2009  "Antibiotic Prophylaxis for Gynecologic Procedures".

## 2017-01-12 NOTE — Anesthesia Post-op Follow-up Note (Signed)
Anesthesia QCDR form completed.        

## 2017-01-12 NOTE — Transfer of Care (Signed)
Immediate Anesthesia Transfer of Care Note  Patient: Mandy Shea  Procedure(s) Performed: TOTAL LAPAROSCOPIC HYSTERECTOMY WITH RIGHT SALPINGECTOMY (Right ) CYSTOSCOPY (N/A )  Patient Location: PACU  Anesthesia Type:General  Level of Consciousness: drowsy  Airway & Oxygen Therapy: Patient Spontanous Breathing and Patient connected to face mask oxygen  Post-op Assessment: Report given to RN and Post -op Vital signs reviewed and stable  Post vital signs: Reviewed and stable  Last Vitals:  Vitals:   01/12/17 0604 01/12/17 1024  BP: 126/75 101/66  Pulse: 64 86  Resp: 18 16  Temp: 36.5 C 36.8 C  SpO2: 100% 100%    Last Pain:  Vitals:   01/12/17 0604  TempSrc: Oral      Patients Stated Pain Goal: 2 (20/10/07 1219)  Complications: No apparent anesthesia complications

## 2017-01-12 NOTE — Anesthesia Preprocedure Evaluation (Signed)
Anesthesia Evaluation  Patient identified by MRN, date of birth, ID band Patient awake    Reviewed: Allergy & Precautions, H&P , NPO status , Patient's Chart, lab work & pertinent test results  History of Anesthesia Complications Negative for: history of anesthetic complications  Airway Mallampati: II  TM Distance: >3 FB Neck ROM: full    Dental  (+) Chipped   Pulmonary neg shortness of breath, sleep apnea ,           Cardiovascular Exercise Tolerance: Good (-) angina(-) Past MI and (-) DOE negative cardio ROS       Neuro/Psych negative neurological ROS  negative psych ROS   GI/Hepatic negative GI ROS, Neg liver ROS, neg GERD  ,  Endo/Other  diabetes, Type 2  Renal/GU      Musculoskeletal   Abdominal   Peds  Hematology negative hematology ROS (+)   Anesthesia Other Findings Past Medical History: No date: Anemia     Comment:  AS A CHILD No date: Diabetes mellitus without complication (HCC) No date: Fibroids No date: Hemorrhoids No date: Seasonal allergies No date: Sleep apnea     Comment:  NO CPAP  Past Surgical History: 09-26-13: CHOLECYSTECTOMY 11/24/2016: HYSTEROSCOPY W/D&C     Comment:  Procedure: DILATATION AND CURETTAGE /HYSTEROSCOPY;                Surgeon: Malachy Mood, MD;  Location: [facility] ORS;                Service: Gynecology;; No date: KNEE SURGERY; Right 2010: LEEP     Comment:  [facility] 2014: TONSILLECTOMY 1997: TUBAL LIGATION  BMI    Body Mass Index:  29.87 kg/m      Reproductive/Obstetrics negative OB ROS                             Anesthesia Physical Anesthesia Plan  ASA: III  Anesthesia Plan: General ETT   Post-op Pain Management:    Induction: Intravenous  PONV Risk Score and Plan: 4 or greater and Ondansetron, Dexamethasone and Midazolam  Airway Management Planned: Oral ETT  Additional Equipment:   Intra-op Plan:   Post-operative  Plan: Extubation in OR  Informed Consent: I have reviewed the patients History and Physical, chart, labs and discussed the procedure including the risks, benefits and alternatives for the proposed anesthesia with the patient or authorized representative who has indicated his/her understanding and acceptance.   Dental Advisory Given  Plan Discussed with: Anesthesiologist, CRNA and Surgeon  Anesthesia Plan Comments: (Patient consented for risks of anesthesia including but not limited to:  - adverse reactions to medications - damage to teeth, lips or other oral mucosa - sore throat or hoarseness - Damage to heart, brain, lungs or loss of life  Patient voiced understanding.)        Anesthesia Quick Evaluation

## 2017-01-12 NOTE — Anesthesia Postprocedure Evaluation (Signed)
Anesthesia Post Note  Patient: JOURY ALLCORN  Procedure(s) Performed: TOTAL LAPAROSCOPIC HYSTERECTOMY WITH RIGHT SALPINGECTOMY (Right ) CYSTOSCOPY (N/A )  Patient location during evaluation: PACU Anesthesia Type: General Level of consciousness: awake and alert Pain management: pain level controlled Vital Signs Assessment: post-procedure vital signs reviewed and stable Respiratory status: spontaneous breathing, nonlabored ventilation, respiratory function stable and patient connected to nasal cannula oxygen Cardiovascular status: blood pressure returned to baseline and stable Postop Assessment: no apparent nausea or vomiting Anesthetic complications: no     Last Vitals:  Vitals:   01/12/17 1139 01/12/17 1200  BP: 100/68 107/68  Pulse: 84 83  Resp: 14 15  Temp: 36.7 C 36.4 C  SpO2: 99% 99%    Last Pain:  Vitals:   01/12/17 1425  TempSrc:   PainSc: Asleep                 Precious Haws Charnay Nazario

## 2017-01-12 NOTE — Op Note (Addendum)
Preoperative Diagnosis: 1) 51 y.o. menorrhagia, dysmenorrhea, and uterine fibroids  Postoperative Diagnosis: 1) 51 y.o. menorrhagia, dysmenorrhea, and uterine fibroids  Operation Performed: Total laparoscopic hysterectomy, bilateral salpingectomy, and cystoscopy  Indication: Menorrhagia, dysmenorrhea, uterine fibroid  Surgeon: Malachy Mood, MD  Assistant: Marcene Corning  Anesthesia: General  Preoperative Antibiotics: Gentamycin, and clindamycin  Estimated Blood Loss:41mL  IV Fluids: 868mL  Urine Output:: 189mL  Drains or Tubes: none  Implants: none  Specimens Removed: Uterus, cervix, and right fallopian tubes  Complications: none  Intraoperative Findings: There were adhesions of the sigmoid colon to the left ovary and uterine cornua.  There was no identifiable left fallopian tube.  There was an anterior left lower uterine segment fibroid.  The uterus had an overall bogey consistency compatible with adenomyosis.    Patient Condition: stable  Procedure in Detail:  Patient was taken to the operating room where she was administered general anesthesia.  She was positioned in the dorsal lithotomy position utilizing Allen stirups, prepped and draped in the usual sterile fashion.  Prior to proceeding with procedure a time out was performed.  Attention was turned to the patient's pelvis.  An indwelling foley catheter was placed to decompress the patient's bladder.  An operative speculum was placed to allow visualization of the cervix.  The anterior lip of the cervix was grasped with a single tooth tenaculum, and a large V-care uterine manipulator was placed to allow manipulation of the uterus.  The operative speculum and single tooth tenaculum were then removed.  Attention was turned to the patient's abdomen.  The umbilicus was infiltrated with 1% Sensorcaine, before making a stab incision using an 11 blade scalpel.  A 58mm Excel trocar was then used to gain direct entry into the  peritoneal cavity utilizing the camera to visualize progress of the trocar during placement.  Once peritoneal entry had been achieved, insufflation was started and pneumoperitoneum established at a pressure of 90mmHg.    One left and one right lower quadrant site were then injected with 1% Sensorcaine and a stab incision was made using an 11 blade scalpel.  Two additional 48mm Excel trocars were placed through these incisions under direct visualization. General inspection of the abdomen revealed the above noted findings.   The right tube was identified and grasped at its fimbriated end.  The tube was transected from its attachments to the ovary and mesosalpinx using a 36mm Harmonic scalpel.  The utero ovarian ligament was identified ligated and transected using the Harmonic scalpel. The round ligament was then likewise ligated and transected.  The anterior leaf of the broad ligament was dissected down to the level of the internal cervical os and a bladder flap was started.  The posterior leaf of the broad ligament was dissected down to the utero-sacral ligament.  The uterine artery was skeletonized before being ligated and transected using the Harmonic scalpel with cephelad pressure applied to the V-care device to assure lateralization of the ureter.  A bite was then taken with Harmonic medial to transected portio of uterine artery to further lateralize the ureter and vessel off the V-care cup.  The patient left adnexal structures were then dissected in similar fashion.  The bladder flap was completed and the bladder mobilized off the V-care cup.  An anterior colpotomy was scores and carried around in a clockwise fashion to free the specimen, which was then removed vaginally.  Inspection revealed all pedicles to be hemostatic before proceed with vaginal closure.  Attention was turned to the patient  pelvis.  An operative speculum was placed and the anterior and posterior portion of the vaginal cuff were tagged with  long Alice clamps.  The cuff was closed using 0 Vicryl placed in interrupted figure of eight stitches.  The cuff was hemsotatic at the conclusion of closure without visible or palpable defects.  The indwelling foley catheter was removed.  Cystoscopy was performed noting and intact bladder dome as well as brisk efflux of urine from bother ureteral orifices.  The cystoscopy was removed and the indwelling foley catheter was replaced.  Pneumoperitoneum was re-established, the pelvis was irrigated and all pedicles were once again inspected and noted to be hemostatic.  The cuff appeared well closed and free of bowl or other tissue that may have inadvertently been incorporated into the vaginal closure.  Additional hemostatic agents were (Arista) applied to all pedicles.      Pneumoperitoneum was evacuated and trocars were removed.  All trocar sites were then dressed with surgical skin glue.  Sponge needle and instrument counts were correct time two.  The patient tolerated the procedure well and was taken to the recovery room in stable condition.

## 2017-01-13 ENCOUNTER — Encounter: Payer: Self-pay | Admitting: Obstetrics and Gynecology

## 2017-01-13 ENCOUNTER — Ambulatory Visit: Payer: BC Managed Care – PPO | Admitting: Obstetrics and Gynecology

## 2017-01-13 DIAGNOSIS — N920 Excessive and frequent menstruation with regular cycle: Secondary | ICD-10-CM | POA: Diagnosis not present

## 2017-01-13 LAB — BASIC METABOLIC PANEL
Anion gap: 6 (ref 5–15)
BUN: 13 mg/dL (ref 6–20)
CALCIUM: 8 mg/dL — AB (ref 8.9–10.3)
CO2: 23 mmol/L (ref 22–32)
CREATININE: 0.79 mg/dL (ref 0.44–1.00)
Chloride: 107 mmol/L (ref 101–111)
Glucose, Bld: 133 mg/dL — ABNORMAL HIGH (ref 65–99)
Potassium: 3.9 mmol/L (ref 3.5–5.1)
SODIUM: 136 mmol/L (ref 135–145)

## 2017-01-13 LAB — CBC
HCT: 24.7 % — ABNORMAL LOW (ref 35.0–47.0)
Hemoglobin: 8.2 g/dL — ABNORMAL LOW (ref 12.0–16.0)
MCH: 29.9 pg (ref 26.0–34.0)
MCHC: 33.3 g/dL (ref 32.0–36.0)
MCV: 89.9 fL (ref 80.0–100.0)
PLATELETS: 435 10*3/uL (ref 150–440)
RBC: 2.75 MIL/uL — ABNORMAL LOW (ref 3.80–5.20)
RDW: 12.4 % (ref 11.5–14.5)
WBC: 13 10*3/uL — ABNORMAL HIGH (ref 3.6–11.0)

## 2017-01-13 LAB — SURGICAL PATHOLOGY

## 2017-01-13 LAB — GLUCOSE, CAPILLARY
Glucose-Capillary: 137 mg/dL — ABNORMAL HIGH (ref 65–99)
Glucose-Capillary: 97 mg/dL (ref 65–99)

## 2017-01-13 MED ORDER — OXYCODONE-ACETAMINOPHEN 5-325 MG PO TABS: 1.0000 | ORAL_TABLET | ORAL | 0 refills | Status: DC | PRN

## 2017-01-13 MED ORDER — IBUPROFEN 600 MG PO TABS: 600.0000 mg | ORAL_TABLET | Freq: Four times a day (QID) | ORAL | 3 refills | Status: DC | PRN

## 2017-01-13 NOTE — Discharge Summary (Signed)
Physician Discharge Summary  Patient ID: Mandy Shea MRN: [id number] DOB/AGE: 1965-11-26 51 y.o.  Admit date: 01/12/2017 Discharge date: 01/13/2017  Admission Diagnoses:  Discharge Diagnoses:  Active Problems:   S/P laparoscopic hysterectomy   Discharged Condition: stable  Hospital Course: Patient was admitted for scheduled TLH, BS, and cystoscopy for menorrhagia and uterine fibroids which proceeded without complications.  She was monitored overnight, tolerated general diet, reported good pain control on po analgesics, voided spontaneously, and was ambulating without difficulty at the time of discharge.  She remained hemodynamically stable and afebrile throughout admission.  H&H and BUN/Cr were stable on POD1 from preoperative values.  Consults: None  Significant Diagnostic Studies: Results for orders placed or performed during the hospital encounter of 01/12/17 (from the past 72 hour(s))  Pregnancy, urine POC     Status: None   Collection Time: 01/12/17  6:16 AM  Result Value Ref Range   Preg Test, Ur NEGATIVE NEGATIVE    Comment:        THE SENSITIVITY OF THIS METHODOLOGY IS >24 mIU/mL   Glucose, capillary     Status: None   Collection Time: 01/12/17  6:21 AM  Result Value Ref Range   Glucose-Capillary 97 65 - 99 mg/dL  CBC     Status: Abnormal   Collection Time: 01/12/17  6:46 AM  Result Value Ref Range   WBC 4.9 3.6 - 11.0 K/uL   RBC 2.98 (L) 3.80 - 5.20 MIL/uL   Hemoglobin 9.1 (L) 12.0 - 16.0 g/dL   HCT 26.6 (L) 35.0 - 47.0 %   MCV 89.3 80.0 - 100.0 fL   MCH 30.5 26.0 - 34.0 pg   MCHC 34.1 32.0 - 36.0 g/dL   RDW 12.6 11.5 - 14.5 %   Platelets 504 (H) 150 - 440 K/uL  Basic metabolic panel     Status: Abnormal   Collection Time: 01/12/17  6:46 AM  Result Value Ref Range   Sodium 136 135 - 145 mmol/L   Potassium 3.8 3.5 - 5.1 mmol/L   Chloride 105 101 - 111 mmol/L   CO2 23 22 - 32 mmol/L   Glucose, Bld 105 (H) 65 - 99 mg/dL   BUN 17 6 - 20 mg/dL   Creatinine, Ser 0.78 0.44 - 1.00 mg/dL   Calcium 8.8 (L) 8.9 - 10.3 mg/dL   GFR calc non Af Amer >60 >60 mL/min   GFR calc Af Amer >60 >60 mL/min    Comment: (NOTE) The eGFR has been calculated using the CKD EPI equation. This calculation has not been validated in all clinical situations. eGFR's persistently <60 mL/min signify possible Chronic Kidney Disease.    Anion gap 8 5 - 15  ABO/Rh     Status: None   Collection Time: 01/12/17  6:46 AM  Result Value Ref Range   ABO/RH(D) B POS   Glucose, capillary     Status: Abnormal   Collection Time: 01/12/17 10:50 AM  Result Value Ref Range   Glucose-Capillary 135 (H) 65 - 99 mg/dL  Glucose, capillary     Status: Abnormal   Collection Time: 01/12/17  5:38 PM  Result Value Ref Range   Glucose-Capillary 166 (H) 65 - 99 mg/dL  Glucose, capillary     Status: Abnormal   Collection Time: 01/12/17  6:15 PM  Result Value Ref Range   Glucose-Capillary 163 (H) 65 - 99 mg/dL  Glucose, capillary     Status: Abnormal   Collection Time: 01/12/17  8:08 PM  Result Value Ref Range   Glucose-Capillary 158 (H) 65 - 99 mg/dL  Glucose, capillary     Status: Abnormal   Collection Time: 01/12/17 11:51 PM  Result Value Ref Range   Glucose-Capillary 140 (H) 65 - 99 mg/dL  CBC     Status: Abnormal   Collection Time: 01/13/17  4:19 AM  Result Value Ref Range   WBC 13.0 (H) 3.6 - 11.0 K/uL   RBC 2.75 (L) 3.80 - 5.20 MIL/uL   Hemoglobin 8.2 (L) 12.0 - 16.0 g/dL   HCT 24.7 (L) 35.0 - 47.0 %   MCV 89.9 80.0 - 100.0 fL   MCH 29.9 26.0 - 34.0 pg   MCHC 33.3 32.0 - 36.0 g/dL   RDW 12.4 11.5 - 14.5 %   Platelets 435 150 - 440 K/uL  Basic metabolic panel     Status: Abnormal   Collection Time: 01/13/17  4:19 AM  Result Value Ref Range   Sodium 136 135 - 145 mmol/L   Potassium 3.9 3.5 - 5.1 mmol/L   Chloride 107 101 - 111 mmol/L   CO2 23 22 - 32 mmol/L   Glucose, Bld 133 (H) 65 - 99 mg/dL   BUN 13 6 - 20 mg/dL   Creatinine, Ser 0.79 0.44 - 1.00 mg/dL    Calcium 8.0 (L) 8.9 - 10.3 mg/dL   GFR calc non Af Amer >60 >60 mL/min   GFR calc Af Amer >60 >60 mL/min    Comment: (NOTE) The eGFR has been calculated using the CKD EPI equation. This calculation has not been validated in all clinical situations. eGFR's persistently <60 mL/min signify possible Chronic Kidney Disease.    Anion gap 6 5 - 15  Glucose, capillary     Status: Abnormal   Collection Time: 01/13/17  4:33 AM  Result Value Ref Range   Glucose-Capillary 137 (H) 65 - 99 mg/dL    Treatments: TLH, BS, cystoscopy - 01/13/17 Discharge Exam: Blood pressure (!) 93/50, pulse 74, temperature 98.6 F (37 C), temperature source Oral, resp. rate 16, height _0  (1.626 m), weight 174 lb (78.9 kg), last menstrual period 12/28/2016, SpO2 98 %. General appearance: alert, appears stated age and no distress Resp: clear to auscultation bilaterally Cardio: regular rate and rhythm, S1, S2 normal, no murmur, click, rub or gallop GI: soft, non-tender; bowel sounds normal; no masses,  no organomegaly Extremities: extremities normal, atraumatic, no cyanosis or edema Incision/Wound: D/C/I trocar sites  Disposition: 01-Home or Self Care  Discharge Instructions    Call MD for:   Complete by:  As directed    Heavy vaginal bleeding greater than 1 pad an hour   Call MD for:  difficulty breathing, headache or visual disturbances   Complete by:  As directed    Call MD for:  extreme fatigue   Complete by:  As directed    Call MD for:  hives   Complete by:  As directed    Call MD for:  persistant dizziness or light-headedness   Complete by:  As directed    Call MD for:  persistant nausea and vomiting   Complete by:  As directed    Call MD for:  redness, tenderness, or signs of infection (pain, swelling, redness, odor or green/yellow discharge around incision site)   Complete by:  As directed    Call MD for:  severe uncontrolled pain   Complete by:  As directed    Call MD for:  temperature >100.4  Complete by:  As directed    Diet general   Complete by:  As directed    Discharge wound care:   Complete by:  As directed    You may apply a light dressing for minor discharge from the incision or to keep waistbands of clothing from rubbing.  You may also have been discharge with a clear dressing in which case this will be removed at your postoperative clinic visit.  You may shower, use soap on your incision.  Avoiding baths or soaking the incision in the first 6 weeks following your surgery..   Driving restriction   Complete by:  As directed    Avoid driving for at least 2 weeks or while taking prescription narcotics.   Lifting restrictions   Complete by:  As directed    Weight restriction of 10lbs for 6 weeks.   Sexual acrtivity   Complete by:  As directed    No intercourse, tampons, or anything vaginally for 6 weeks     Allergies as of 01/13/2017      Reactions   Augmentin [amoxicillin-pot Clavulanate] Itching      Medication List    STOP taking these medications   norethindrone 5 MG tablet Commonly known as:  AYGESTIN     TAKE these medications   b complex vitamins tablet Take 1 tablet by mouth daily.   ibuprofen 600 MG tablet Commonly known as:  ADVIL,MOTRIN Take 1 tablet (600 mg total) every 6 (six) hours as needed by mouth for mild pain or cramping.   loratadine 10 MG tablet Commonly known as:  CLARITIN Take 10 mg by mouth daily.   metFORMIN 500 MG tablet Commonly known as:  GLUCOPHAGE Take 500 mg by mouth every evening.   montelukast 10 MG tablet Commonly known as:  SINGULAIR Take 10 mg by mouth daily.   oxyCODONE-acetaminophen 5-325 MG tablet Commonly known as:  PERCOCET/ROXICET Take 1-2 tablets every 4 (four) hours as needed by mouth (moderate to severe pain (when tolerating fluids)).   VICTOZA 18 MG/3ML Sopn Generic drug:  liraglutide Inject 1.2 mg into the skin daily.            Discharge Care Instructions  (From admission, onward)         Start     Ordered   01/13/17 0000  Discharge wound care:    Comments:  You may apply a light dressing for minor discharge from the incision or to keep waistbands of clothing from rubbing.  You may also have been discharge with a clear dressing in which case this will be removed at your postoperative clinic visit.  You may shower, use soap on your incision.  Avoiding baths or soaking the incision in the first 6 weeks following your surgery..   01/13/17 3220       Signed: Malachy Mood 01/13/2017, 7:20 AM

## 2017-01-13 NOTE — Progress Notes (Signed)
Pt discharged home.  Discharge instructions, prescriptions and follow up appointment given to and reviewed with pt.  Pt verbalized understanding.  Escorted by auxillary. 

## 2017-01-17 ENCOUNTER — Ambulatory Visit (INDEPENDENT_AMBULATORY_CARE_PROVIDER_SITE_OTHER): Payer: BC Managed Care – PPO | Admitting: Obstetrics and Gynecology

## 2017-01-17 ENCOUNTER — Encounter: Payer: Self-pay | Admitting: Obstetrics and Gynecology

## 2017-01-17 VITALS — BP 114/64 | HR 102 | Wt 172.0 lb

## 2017-01-17 DIAGNOSIS — Z4889 Encounter for other specified surgical aftercare: Secondary | ICD-10-CM

## 2017-01-17 NOTE — Progress Notes (Signed)
      Postoperative Follow-up Patient presents post op from [place], BS, cysto 1 week ago for abnormal uterine bleeding and fibroids.  Subjective: Patient reports marked improvement in her preop symptoms. Eating a regular diet without difficulty. Pain is controlled without any medications.  Activity: normal activities of daily living.  Objective: Vitals:   01/17/17 1024  BP: 114/64  Pulse: (!) 102   Gen: NAD HEENT: normocephalic, anicteric Abdomen: soft, non-tender, non-distended, trocar sites healing well Ext: no edema  Assessment: 51 y.o. s/p TLH, BS, cystoscopy stable  Plan: Patient has done well after surgery with no apparent complications.  I have discussed the post-operative course to date, and the expected progress moving forward.  The patient understands what complications to be concerned about.  I will see the patient in routine follow up, or sooner if needed.    Activity plan: No heavy lifting, no intercourse  Return in about 5 weeks (around 02/21/2017) for postop visit.   Malachy Mood 01/17/2017, 1:06 PM

## 2017-01-21 ENCOUNTER — Encounter: Payer: Self-pay | Admitting: Obstetrics and Gynecology

## 2017-01-23 ENCOUNTER — Encounter: Payer: Self-pay | Admitting: Obstetrics and Gynecology

## 2017-01-23 ENCOUNTER — Ambulatory Visit (INDEPENDENT_AMBULATORY_CARE_PROVIDER_SITE_OTHER): Payer: BC Managed Care – PPO | Admitting: Obstetrics and Gynecology

## 2017-01-23 VITALS — BP 108/62 | HR 80 | Wt 174.0 lb

## 2017-01-23 DIAGNOSIS — Z4889 Encounter for other specified surgical aftercare: Secondary | ICD-10-CM

## 2017-01-24 NOTE — Progress Notes (Signed)
      Postoperative Follow-up Patient presents post op from [place], BS, and cystoscopy 1.5 weeks ago for abnormal uterine bleeding and fibroids.  Subjective: Patient reports marked improvement in her preop symptoms. Eating a regular diet without difficulty. Pain is controlled without any medications.  Activity: normal activities of daily living. Has noted yellow tinged discharge, no bleeding, no abdominal pain, no fevers, no chills, no nausea or vomitting  Objective: Vitals:   01/23/17 1600  BP: 108/62  Pulse: 80   Gen: NAD Abdomen: soft, non-tender, non-distended, trocar sites D/C/I  Assessment: 51 y.o. s/p TLH, BS, cystoscopy stable  Plan: Patient has done well after surgery with no apparent complications.  I have discussed the post-operative course to date, and the expected progress moving forward.  The patient understands what complications to be concerned about.  I will see the patient in routine follow up, or sooner if needed.   We discussed discharge during healing process and absorption of stitches.  Betadine and old blood breakdown products may lend the discharge a yellowish discoloration. Patient reassured.  Activity plan: No heavy lifting.  Malachy Mood 01/24/2017, 10:43 PM

## 2017-02-22 ENCOUNTER — Encounter: Payer: Self-pay | Admitting: Obstetrics and Gynecology

## 2017-02-22 ENCOUNTER — Ambulatory Visit: Payer: BC Managed Care – PPO | Admitting: Obstetrics and Gynecology

## 2017-02-22 ENCOUNTER — Ambulatory Visit (INDEPENDENT_AMBULATORY_CARE_PROVIDER_SITE_OTHER): Payer: BC Managed Care – PPO | Admitting: Obstetrics and Gynecology

## 2017-02-22 VITALS — BP 114/78 | HR 76 | Ht 64.0 in | Wt 174.0 lb

## 2017-02-22 DIAGNOSIS — N760 Acute vaginitis: Secondary | ICD-10-CM

## 2017-02-22 DIAGNOSIS — B9689 Other specified bacterial agents as the cause of diseases classified elsewhere: Secondary | ICD-10-CM

## 2017-02-22 DIAGNOSIS — Z4889 Encounter for other specified surgical aftercare: Secondary | ICD-10-CM

## 2017-02-22 MED ORDER — METRONIDAZOLE 500 MG PO TABS: 500.0000 mg | ORAL_TABLET | Freq: Two times a day (BID) | ORAL | 0 refills | Status: AC

## 2017-02-22 NOTE — Progress Notes (Addendum)
      Postoperative Follow-up Patient presents post op from [facility], BS, cystoscopy 6weeks ago (01/12/17) for abnormal uterine bleeding, uterine fibroids.  Subjective: Patient reports marked improvement in her preop symptoms. Eating a regular diet without difficulty. The patient is not having any pain.  Activity: normal activities of daily living. Still having some discharge, slight odor, no itching, still yellowish to off white.  Objective: Vitals:   02/22/17 1154  BP: 114/78  Pulse: 76   Gen: NAD HEENT: normocephalic, anicteric Abdomen: soft, non-tender, non-distended GU: normal external female genitalia, normal vagina, vaginal cuff well healed, no defects visually or on palpation Ext: no edema  Pathology UTERUS WITH CERVIX AND RIGHT FALLOPIAN TUBE; TOTAL HYSTERECTOMY WITH  RIGHT SALPINGECTOMY:  - CHRONIC CERVICITIS WITH SQUAMOUS METAPLASIA AND MIXED ENDOCERVICAL /  ENDOMETRIAL POLYP.  - PROLIFERATIVE ENDOMETRIUM.  - MYOMETRIUM WITH INTRAMURAL LEIOMYOMATA.  - FALLOPIAN TUBE WITH BENIGN PARATUBAL CYST.  - NEGATIVE FOR MALIGNANCY.   Wet Prep: PH: N/A  Clue Cells: Positive Fungal elements: Negative Trichomonas: Negative   Assessment: 51 y.o. s/p TLH, BS, and cystoscopy stable  Plan: Patient has done well after surgery with no apparent complications.  I have discussed the post-operative course to date, and the expected progress moving forward.  The patient understands what complications to be concerned about.  I will see the patient in routine follow up, or sooner if needed.    Activity plan: No restriction. - work release - Flagyl for BV  [employer] 02/22/2017, 11:56 AM

## 2017-02-22 NOTE — Addendum Note (Signed)
Addended by: Dorthula Nettles on: 02/22/2017 12:09 PM   Modules accepted: Orders

## 2017-03-04 ENCOUNTER — Other Ambulatory Visit: Payer: Self-pay | Admitting: Internal Medicine

## 2017-03-08 ENCOUNTER — Other Ambulatory Visit: Payer: Self-pay

## 2017-04-17 ENCOUNTER — Ambulatory Visit: Payer: BC Managed Care – PPO | Admitting: Nurse Practitioner

## 2017-04-17 VITALS — BP 121/86 | HR 76 | Temp 97.4°F | Resp 16 | Ht 64.0 in | Wt 179.0 lb

## 2017-04-17 DIAGNOSIS — E1165 Type 2 diabetes mellitus with hyperglycemia: Secondary | ICD-10-CM | POA: Diagnosis not present

## 2017-04-17 DIAGNOSIS — J0140 Acute pansinusitis, unspecified: Secondary | ICD-10-CM | POA: Diagnosis not present

## 2017-04-17 DIAGNOSIS — J3089 Other allergic rhinitis: Secondary | ICD-10-CM

## 2017-04-17 LAB — POCT GLYCOSYLATED HEMOGLOBIN (HGB A1C): HEMOGLOBIN A1C: 6.4

## 2017-04-17 MED ORDER — AZITHROMYCIN 250 MG PO TABS: ORAL_TABLET | ORAL | 0 refills | Status: DC

## 2017-04-17 MED ORDER — LORATADINE 10 MG PO TABS: 10.0000 mg | ORAL_TABLET | Freq: Every day | ORAL | 5 refills | Status: DC

## 2017-04-17 NOTE — Progress Notes (Signed)
[facility] [place], [place] [address]  Internal MEDICINE  Office Visit Note  Patient Name: Mandy Shea  [id number]  [id number]  Date of Service: 04/26/2017  Chief Complaint  Patient presents with  . Sinusitis    Sinusitis  This is a new problem. The current episode started in the past 7 days. The problem is unchanged. There has been no fever. Associated symptoms include congestion, coughing, headaches, sinus pressure and sneezing. Pertinent negatives include no chills, neck pain, shortness of breath or sore throat. The treatment provided mild relief.    Pt is here for routine follow up.    Current Medication: Outpatient Encounter Medications as of 04/17/2017  Medication Sig  . b complex vitamins tablet Take 1 tablet by mouth daily.  . BD PEN NEEDLE NANO U/F 32G X 4 MM MISC   . loratadine (CLARITIN) 10 MG tablet Take 1 tablet (10 mg total) by mouth daily.  . metFORMIN (GLUCOPHAGE) 500 MG tablet Take 500 mg by mouth every evening.  . montelukast (SINGULAIR) 10 MG tablet TAKE 1 TABLET BY MOUTH EVERY DAY  . VICTOZA 18 MG/3ML SOPN Inject 1.2 mg into the skin daily.   . [DISCONTINUED] loratadine (CLARITIN) 10 MG tablet Take 10 mg by mouth daily.  Marland Kitchen azithromycin (ZITHROMAX) 250 MG tablet z-pack - take as directed for 5 days   No facility-administered encounter medications on file as of 04/17/2017.     Surgical History: Past Surgical History:  Procedure Laterality Date  . CHOLECYSTECTOMY  09-26-13  . CYSTOSCOPY N/A 01/12/2017   Procedure: CYSTOSCOPY;  Surgeon: Malachy Mood, MD;  Location: [facility] ORS;  Service: Gynecology;  Laterality: N/A;  . HYSTEROSCOPY W/D&C  11/24/2016   Procedure: DILATATION AND CURETTAGE /HYSTEROSCOPY;  Surgeon: Malachy Mood, MD;  Location: [facility] ORS;  Service: Gynecology;;  . KNEE SURGERY Right   . LEEP  2010   [facility]  . TONSILLECTOMY  2014  . TOTAL LAPAROSCOPIC HYSTERECTOMY WITH SALPINGECTOMY Right 01/12/2017    Procedure: TOTAL LAPAROSCOPIC HYSTERECTOMY WITH RIGHT SALPINGECTOMY;  Surgeon: Malachy Mood, MD;  Location: [facility] ORS;  Service: Gynecology;  Laterality: Right;  . TUBAL LIGATION  1997    Medical History: Past Medical History:  Diagnosis Date  . Anemia    AS A CHILD  . Diabetes mellitus without complication ([place])   . Fibroids   . Hemorrhoids   . Seasonal allergies   . Sleep apnea    NO CPAP    Family History: Family History  Problem Relation Age of Onset  . Breast cancer Neg Hx     Social History   Socioeconomic History  . Marital status: Married    Spouse name: Not on file  . Number of children: Not on file  . Years of education: Not on file  . Highest education level: Not on file  Social Needs  . Financial resource strain: Not on file  . Food insecurity - worry: Not on file  . Food insecurity - inability: Not on file  . Transportation needs - medical: Not on file  . Transportation needs - non-medical: Not on file  Occupational History  . Not on file  Tobacco Use  . Smoking status: Never Smoker  . Smokeless tobacco: Never Used  Substance and Sexual Activity  . Alcohol use: No  . Drug use: No  . Sexual activity: Yes    Birth control/protection: None  Other Topics Concern  . Not on file  Social History Narrative  . Not on  file      Review of Systems  Constitutional: Positive for fatigue. Negative for activity change, chills and unexpected weight change.  HENT: Positive for congestion, postnasal drip, sinus pressure and sneezing. Negative for rhinorrhea and sore throat.   Eyes: Negative.  Negative for redness.  Respiratory: Positive for cough. Negative for chest tightness, shortness of breath and wheezing.   Cardiovascular: Negative for chest pain and palpitations.  Gastrointestinal: Negative for abdominal pain, constipation, diarrhea, nausea and vomiting.  Endocrine:       Blood sugars doing well   Genitourinary: Negative for dysuria and frequency.   Musculoskeletal: Negative for arthralgias, back pain, joint swelling and neck pain.  Skin: Negative for rash.  Allergic/Immunologic: Positive for environmental allergies.  Neurological: Positive for headaches. Negative for tremors and numbness.  Hematological: Negative for adenopathy. Does not bruise/bleed easily.  Psychiatric/Behavioral: Negative for behavioral problems (Depression), sleep disturbance and suicidal ideas. The patient is not nervous/anxious.     Today's Vitals   04/17/17 1540  BP: 121/86  Pulse: 76  Resp: 16  Temp: (!) 97.4 F (36.3 C)  SpO2: 96%  Weight: 179 lb (81.2 kg)  Height: 5\' 4"  (1.626 m)    Physical Exam  Constitutional: She is oriented to person, place, and time. She appears well-developed and well-nourished. No distress.  HENT:  Head: Normocephalic and atraumatic.  Right Ear: Tympanic membrane is bulging.  Left Ear: Tympanic membrane is bulging.  Nose: Rhinorrhea present. Right sinus exhibits frontal sinus tenderness. Left sinus exhibits frontal sinus tenderness.  Mouth/Throat: Posterior oropharyngeal erythema present. No oropharyngeal exudate.  Eyes: EOM are normal. Pupils are equal, round, and reactive to light.  Neck: Normal range of motion. Neck supple. No JVD present. No tracheal deviation present. No thyromegaly present.  Cardiovascular: Normal rate, regular rhythm and normal heart sounds. Exam reveals no gallop and no friction rub.  No murmur heard. Pulmonary/Chest: Effort normal and breath sounds normal. No respiratory distress. She has no wheezes. She has no rales. She exhibits no tenderness.  Abdominal: Soft. Bowel sounds are normal.  Musculoskeletal: Normal range of motion.  Lymphadenopathy:    She has no cervical adenopathy.  Neurological: She is alert and oriented to person, place, and time. No cranial nerve deficit.  Skin: Skin is warm and dry. She is not diaphoretic.  Psychiatric: She has a normal mood and affect. Her behavior is  normal. Judgment and thought content normal.  Nursing note and vitals reviewed.  Assessment/Plan: 1. Acute non-recurrent pansinusitis - azithromycin (ZITHROMAX) 250 MG tablet; z-pack - take as directed for 5 days  Dispense: 6 tablet; Refill: 0 Use OTC medications to manage symptoms.  2. Seasonal allergic rhinitis due to other allergic trigger - loratadine (CLARITIN) 10 MG tablet; Take 1 tablet (10 mg total) by mouth daily.  Dispense: 30 tablet; Refill: 5  3. Uncontrolled type 2 diabetes mellitus with hyperglycemia (HCC) - POCT HgB A1C 6.4 today. Continue metformin as prescribed.   General Counseling: Delbra verbalizes understanding of the findings of todays visit and agrees with plan of treatment. I have discussed any further diagnostic evaluation that may be needed or ordered today. We also reviewed her medications today. she has been encouraged to call the office with any questions or concerns that should arise related to todays visit.  This patient was seen by Leretha Pol, FNP- C in Collaboration with Dr Lavera Guise as a part of collaborative care agreement   Orders Placed This Encounter  Procedures  . POCT HgB A1C  Meds ordered this encounter  Medications  . loratadine (CLARITIN) 10 MG tablet    Sig: Take 1 tablet (10 mg total) by mouth daily.    Dispense:  30 tablet    Refill:  5    Order Specific Question:   Supervising Provider    Answer:   Lavera Guise [[id number]]  . azithromycin (ZITHROMAX) 250 MG tablet    Sig: z-pack - take as directed for 5 days    Dispense:  6 tablet    Refill:  0    Order Specific Question:   Supervising Provider    Answer:   Lavera Guise [[id number]]    Time spent: 30 Minutes   Dr Lavera Guise Internal medicine

## 2017-04-26 ENCOUNTER — Encounter: Payer: Self-pay | Admitting: Nurse Practitioner

## 2017-05-24 ENCOUNTER — Other Ambulatory Visit: Payer: Self-pay | Admitting: Nurse Practitioner

## 2017-05-24 ENCOUNTER — Other Ambulatory Visit: Payer: Self-pay | Admitting: Internal Medicine

## 2017-05-24 ENCOUNTER — Telehealth: Payer: Self-pay | Admitting: Nurse Practitioner

## 2017-05-24 DIAGNOSIS — B001 Herpesviral vesicular dermatitis: Secondary | ICD-10-CM

## 2017-05-24 MED ORDER — VALACYCLOVIR HCL 1 G PO TABS: ORAL_TABLET | ORAL | 2 refills | Status: DC

## 2017-05-24 NOTE — Telephone Encounter (Signed)
Patient called regarding recurrent fever blisters - add valacyclovir 1000mg  twice daily for 5 days to treat current blister. After that, take 1 tablet daily to prevent further fever blisters. Continue to use the abreva as needed  sent the prescription to her pharmacy.

## 2017-05-24 NOTE — Progress Notes (Signed)
Patient called regarding recurrent fever blisters - add valacyclovir 1000mg  twice daily for 5 days to treat current blister. After that, take 1 tablet daily to prevent further fever blisters. Continue to use the abreva as needed

## 2017-05-24 NOTE — Telephone Encounter (Signed)
Left message and advised pt her Rx is at pharmacy

## 2017-06-09 ENCOUNTER — Ambulatory Visit: Payer: BC Managed Care – PPO | Admitting: Nurse Practitioner

## 2017-06-09 ENCOUNTER — Encounter: Payer: Self-pay | Admitting: Nurse Practitioner

## 2017-06-09 VITALS — BP 135/88 | HR 72 | Temp 97.3°F | Resp 16 | Ht 64.0 in | Wt 178.0 lb

## 2017-06-09 DIAGNOSIS — E1165 Type 2 diabetes mellitus with hyperglycemia: Secondary | ICD-10-CM | POA: Diagnosis not present

## 2017-06-09 DIAGNOSIS — M25562 Pain in left knee: Secondary | ICD-10-CM

## 2017-06-09 MED ORDER — DICLOFENAC SODIUM 1 % TD GEL: 4.0000 g | Freq: Four times a day (QID) | TRANSDERMAL | 3 refills | Status: DC

## 2017-06-09 NOTE — Progress Notes (Signed)
[facility] [place], [place] [address]  Internal MEDICINE  Office Visit Note  Patient Name: Mandy Shea  [id number]  [id number]  Date of Service: 06/28/2017    Pt is here for a sick visit.  Chief Complaint  Patient presents with  . Other    left knee swelling     The patient is here for sick visit. C/o today of left knee pain and swelling. Two pockets of swelling over the left knee cap. Tender when pushed on. Gets very stiff and sore, especially after she has been sitting for a while. Gets more swollen and sore after she exercises. Is walking on the treadmill and playing volleyball. Wears a knee brace for support She also ices and elevates her leg when possible. Takes ibuprofen for pain. This all helps for short period of time.       Current Medication:  Outpatient Encounter Medications as of 06/09/2017  Medication Sig  . azithromycin (ZITHROMAX) 250 MG tablet z-pack - take as directed for 5 days  . b complex vitamins tablet Take 1 tablet by mouth daily.  . BD PEN NEEDLE NANO U/F 32G X 4 MM MISC   . diclofenac sodium (VOLTAREN) 1 % GEL Apply 4 g topically 4 (four) times daily.  Marland Kitchen loratadine (CLARITIN) 10 MG tablet Take 1 tablet (10 mg total) by mouth daily.  . metFORMIN (GLUCOPHAGE) 500 MG tablet Take 500 mg by mouth every evening.  . montelukast (SINGULAIR) 10 MG tablet TAKE 1 TABLET BY MOUTH EVERY DAY  . valACYclovir (VALTREX) 1000 MG tablet Take 1 tablet po BID for 5 days, then take 1 tablet po QD to prevent fever blisters.  Marland Kitchen VICTOZA 18 MG/3ML SOPN INJECT 1.2MG  SUBCUTANEOUSLY EVERY DAY   No facility-administered encounter medications on file as of 06/09/2017.       Medical History: Past Medical History:  Diagnosis Date  . Anemia    AS A CHILD  . Diabetes mellitus without complication ([place])   . Fibroids   . Hemorrhoids   . Seasonal allergies   . Sleep apnea    NO CPAP    Today's Vitals   06/09/17 1450  BP: 135/88  Pulse: 72   Resp: 16  Temp: (!) 97.3 F (36.3 C)  SpO2: 98%  Weight: 178 lb (80.7 kg)  Height: 5\' 4"  (1.626 m)    Review of Systems  Constitutional: Negative for activity change, chills, fatigue and unexpected weight change.  HENT: Negative for congestion, postnasal drip, rhinorrhea, sneezing and sore throat.   Eyes: Negative.  Negative for redness.  Respiratory: Negative for cough, chest tightness, shortness of breath and wheezing.   Cardiovascular: Negative for chest pain and palpitations.  Gastrointestinal: Negative for abdominal pain, constipation, diarrhea, nausea and vomiting.  Endocrine:       Blood sugars doing well   Genitourinary: Negative for dysuria and frequency.  Musculoskeletal: Positive for arthralgias. Negative for back pain, joint swelling and neck pain.       Left knee pain  Skin: Negative for rash.  Allergic/Immunologic: Negative for environmental allergies.  Neurological: Negative.  Negative for tremors and numbness.  Hematological: Negative for adenopathy. Does not bruise/bleed easily.  Psychiatric/Behavioral: Negative for behavioral problems (Depression), sleep disturbance and suicidal ideas. The patient is not nervous/anxious.     Physical Exam  Constitutional: She is oriented to person, place, and time. She appears well-developed and well-nourished. No distress.  HENT:  Head: Normocephalic and atraumatic.  Mouth/Throat: Oropharynx is clear  and moist. No oropharyngeal exudate.  Eyes: Pupils are equal, round, and reactive to light. EOM are normal.  Neck: Normal range of motion. Neck supple. No JVD present. No tracheal deviation present. No thyromegaly present.  Cardiovascular: Normal rate, regular rhythm and normal heart sounds. Exam reveals no gallop and no friction rub.  No murmur heard. Pulmonary/Chest: Effort normal and breath sounds normal. No respiratory distress. She has no wheezes. She has no rales. She exhibits no tenderness.  Abdominal: Soft. Bowel sounds  are normal.  Musculoskeletal:  Left knee tenderness. Fluid pockets present just superior of the patella. Painful when palpated. Crepitus can be felt with flexion and extension of the knee. She is walking with slight limp favoring the right knee. No bony abnormalities or deformities are noted.   Lymphadenopathy:    She has no cervical adenopathy.  Neurological: She is alert and oriented to person, place, and time. No cranial nerve deficit.  Skin: Skin is warm and dry. She is not diaphoretic.  Psychiatric: She has a normal mood and affect. Her behavior is normal. Judgment and thought content normal.  Nursing note and vitals reviewed.  Assessment/Plan: 1. Acute pain of left knee - DG Knee Complete 4 Views Left; Future - diclofenac sodium (VOLTAREN) 1 % GEL; Apply 4 g topically 4 (four) times daily.  Dispense: 100 g; Refill: 3 Reffer to orthopedics as indicated.   2. Uncontrolled type 2 diabetes mellitus with hyperglycemia ([place]) Continue all diabetic medication as prescribed   General Counseling: Nayleah verbalizes understanding of the findings of todays visit and agrees with plan of treatment. I have discussed any further diagnostic evaluation that may be needed or ordered today. We also reviewed her medications today. she has been encouraged to call the office with any questions or concerns that should arise related to todays visit.  Apply a compressive ACE bandage. Rest and elevate the affected painful area.  Apply cold compresses intermittently as needed.  As pain recedes, begin normal activities slowly as tolerated.  Call if symptoms persist.  This patient was seen by Leretha Pol, FNP- C in Collaboration with Dr Lavera Guise as a part of collaborative care agreement  Orders Placed This Encounter  Procedures  . DG Knee Complete 4 Views Left    Meds ordered this encounter  Medications  . diclofenac sodium (VOLTAREN) 1 % GEL    Sig: Apply 4 g topically 4 (four) times daily.     Dispense:  100 g    Refill:  3    Order Specific Question:   Supervising Provider    Answer:   Lavera Guise [[id number]]    Time spent: 15 Minutes

## 2017-06-12 ENCOUNTER — Telehealth: Payer: Self-pay | Admitting: Internal Medicine

## 2017-06-12 ENCOUNTER — Telehealth: Payer: Self-pay

## 2017-06-12 NOTE — Telephone Encounter (Signed)
Pharmacy was called and notified of approval for Voltaren gel 1%

## 2017-06-12 NOTE — Telephone Encounter (Signed)
Patient advised prior authorization for Voltaren gel has been approved.Tat

## 2017-06-16 ENCOUNTER — Encounter: Payer: Self-pay | Admitting: Obstetrics and Gynecology

## 2017-06-19 ENCOUNTER — Telehealth: Payer: Self-pay

## 2017-06-19 NOTE — Telephone Encounter (Signed)
Pt called on 4/19 to report bleeding, as of today it has stopped , she states she is just going to wait it out, she will call us if the bleeding returns, she just wanted AMS to know.

## 2017-06-19 NOTE — Telephone Encounter (Signed)
Called and left vm stating to call back and get an appt scheduled with another provider.

## 2017-06-19 NOTE — Telephone Encounter (Signed)
Pt is aware AMS is out of the office this week.

## 2017-06-28 DIAGNOSIS — M25562 Pain in left knee: Secondary | ICD-10-CM | POA: Insufficient documentation

## 2017-06-28 DIAGNOSIS — E1165 Type 2 diabetes mellitus with hyperglycemia: Secondary | ICD-10-CM | POA: Insufficient documentation

## 2017-07-31 ENCOUNTER — Other Ambulatory Visit: Payer: Self-pay | Admitting: Internal Medicine

## 2017-08-14 ENCOUNTER — Other Ambulatory Visit: Payer: Self-pay | Admitting: Nurse Practitioner

## 2017-09-04 ENCOUNTER — Other Ambulatory Visit: Payer: Self-pay | Admitting: Internal Medicine

## 2017-09-04 DIAGNOSIS — Z1231 Encounter for screening mammogram for malignant neoplasm of breast: Secondary | ICD-10-CM

## 2017-09-11 ENCOUNTER — Other Ambulatory Visit: Payer: Self-pay

## 2017-09-11 ENCOUNTER — Other Ambulatory Visit: Payer: Self-pay | Admitting: Internal Medicine

## 2017-09-11 DIAGNOSIS — J3089 Other allergic rhinitis: Secondary | ICD-10-CM

## 2017-09-11 MED ORDER — MONTELUKAST SODIUM 10 MG PO TABS: 10.0000 mg | ORAL_TABLET | Freq: Every day | ORAL | 5 refills | Status: DC

## 2017-09-11 MED ORDER — LORATADINE 10 MG PO TABS: 10.0000 mg | ORAL_TABLET | Freq: Every day | ORAL | 5 refills | Status: DC

## 2017-09-15 ENCOUNTER — Encounter: Payer: Self-pay | Admitting: Nurse Practitioner

## 2017-09-15 ENCOUNTER — Ambulatory Visit (INDEPENDENT_AMBULATORY_CARE_PROVIDER_SITE_OTHER): Payer: BC Managed Care – PPO | Admitting: Nurse Practitioner

## 2017-09-15 VITALS — BP 118/88 | HR 64 | Resp 16 | Ht 64.0 in | Wt 178.4 lb

## 2017-09-15 DIAGNOSIS — E1165 Type 2 diabetes mellitus with hyperglycemia: Secondary | ICD-10-CM | POA: Diagnosis not present

## 2017-09-15 DIAGNOSIS — Z124 Encounter for screening for malignant neoplasm of cervix: Secondary | ICD-10-CM | POA: Diagnosis not present

## 2017-09-15 DIAGNOSIS — Z0001 Encounter for general adult medical examination with abnormal findings: Secondary | ICD-10-CM

## 2017-09-15 DIAGNOSIS — Z23 Encounter for immunization: Secondary | ICD-10-CM

## 2017-09-15 DIAGNOSIS — R300 Dysuria: Secondary | ICD-10-CM

## 2017-09-15 LAB — POCT GLYCOSYLATED HEMOGLOBIN (HGB A1C): Hemoglobin A1C: 6.8 % — AB (ref 4.0–5.6)

## 2017-09-15 MED ORDER — PNEUMOCOCCAL VAC POLYVALENT 25 MCG/0.5ML IJ INJ: INJECTION | INTRAMUSCULAR | 0 refills | Status: DC

## 2017-09-15 NOTE — Progress Notes (Signed)
[facility] [place], [place] [address]  Internal MEDICINE  Office Visit Note  Patient Name: Mandy Shea  [id number]  [id number]  Date of Service: 10/08/2017   Pt is here for routine health maintenance examination  Chief Complaint  Patient presents with  . Annual Exam  . Diabetes     Diabetes  She presents for her follow-up diabetic visit. She has type 2 diabetes mellitus. No MedicAlert identification noted. Her disease course has been stable. There are no hypoglycemic associated symptoms. Pertinent negatives for hypoglycemia include no dizziness, headaches, nervousness/anxiousness or tremors. There are no diabetic associated symptoms. Pertinent negatives for diabetes include no chest pain and no fatigue. There are no hypoglycemic complications. Symptoms are stable. There are no diabetic complications. Risk factors for coronary artery disease include dyslipidemia, hypertension, post-menopausal and stress. Current diabetic treatment includes oral agent (monotherapy) (victoza). She is compliant with treatment all of the time. Her weight is stable. She is following a generally healthy diet. Meal planning includes avoidance of concentrated sweets. She has had a previous visit with a dietitian. She participates in exercise three times a week. There is no change in her home blood glucose trend. An ACE inhibitor/angiotensin II receptor blocker is not being taken. She does not see a podiatrist.Eye exam is current.     Current Medication: Outpatient Encounter Medications as of 09/15/2017  Medication Sig  . azithromycin (ZITHROMAX) 250 MG tablet z-pack - take as directed for 5 days  . b complex vitamins tablet Take 1 tablet by mouth daily.  . BD PEN NEEDLE NANO U/F 32G X 4 MM MISC   . diclofenac sodium (VOLTAREN) 1 % GEL Apply 4 g topically 4 (four) times daily.  Marland Kitchen loratadine (CLARITIN) 10 MG tablet Take 1 tablet (10 mg total) by mouth daily.  . metFORMIN  (GLUCOPHAGE) 500 MG tablet Take 500 mg by mouth every evening.  . metFORMIN (GLUCOPHAGE-XR) 500 MG 24 hr tablet TAKE 1 TABLET BY MOUTH EVERY DAY FOR DIABETES  . montelukast (SINGULAIR) 10 MG tablet Take 1 tablet (10 mg total) by mouth daily.  . valACYclovir (VALTREX) 1000 MG tablet Take 1 tablet po BID for 5 days, then take 1 tablet po QD to prevent fever blisters.  Marland Kitchen VICTOZA 18 MG/3ML SOPN INJECT 1.2MG  SUBCUTANEOUSLY EVERY DAY  . pneumococcal 23 valent vaccine (PNEUMOVAX 23) 25 MCG/0.5ML injection Inject 0.43ml IM once   No facility-administered encounter medications on file as of 09/15/2017.     Surgical History: Past Surgical History:  Procedure Laterality Date  . CHOLECYSTECTOMY  09-26-13  . CYSTOSCOPY N/A 01/12/2017   Procedure: CYSTOSCOPY;  Surgeon: Malachy Mood, MD;  Location: [facility] ORS;  Service: Gynecology;  Laterality: N/A;  . HYSTEROSCOPY W/D&C  11/24/2016   Procedure: DILATATION AND CURETTAGE /HYSTEROSCOPY;  Surgeon: Malachy Mood, MD;  Location: [facility] OR[facility];  Service: Gynecology;;  . KNEE SURGERY Right   . LEEP  2010   [facility]  . TONSILLECTOMY  2014  . TOTAL LAPAROSCOPIC HYSTERECTOMY WITH SALPINGECTOMY Right 01/12/2017   Procedure: TOTAL LAPAROSCOPIC HYSTERECTOMY WITH RIGHT SALPINGECTOMY;  Surgeon: Malachy Mood, MD;  Location: [facility] OR[facility];  Service: Gynecology;  Laterality: Right;  . TUBAL LIGATION  1997    Medical History: Past Medical History:  Diagnosis Date  . Anemia    AS A CHILD  . Diabetes mellitus without complication ([place])   . Fibroids   . Hemorrhoids   . Seasonal allergies   . Sleep apnea    NO CPAP  Family History: Family History  Problem Relation Age of Onset  . Breast cancer Neg Hx       Review of Systems  Constitutional: Negative for activity change, chills, fatigue and unexpected weight change.  HENT: Negative for congestion, postnasal drip, rhinorrhea, sneezing and sore throat.   Eyes: Negative.  Negative for redness.   Respiratory: Negative for cough, chest tightness, shortness of breath and wheezing.   Cardiovascular: Negative for chest pain and palpitations.  Gastrointestinal: Negative for abdominal pain, constipation, diarrhea, nausea and vomiting.  Endocrine:       Blood sugars doing well   Genitourinary: Negative.  Negative for dysuria and frequency.  Musculoskeletal: Positive for arthralgias. Negative for back pain, joint swelling and neck pain.       Left knee pain  Skin: Negative for rash.  Allergic/Immunologic: Negative for environmental allergies.  Neurological: Negative for dizziness, tremors, numbness and headaches.  Hematological: Negative for adenopathy. Does not bruise/bleed easily.  Psychiatric/Behavioral: Negative for behavioral problems (Depression), dysphoric mood, sleep disturbance and suicidal ideas. The patient is not nervous/anxious.      Today's Vitals   09/15/17 1105  BP: 118/88  Pulse: 64  Resp: 16  SpO2: 98%  Weight: 178 lb 6.4 oz (80.9 kg)  Height: 5\' 4"  (1.626 m)    Physical Exam  Constitutional: She is oriented to person, place, and time. She appears well-developed and well-nourished. No distress.  HENT:  Head: Normocephalic and atraumatic.  Nose: Nose normal.  Mouth/Throat: Oropharynx is clear and moist. No oropharyngeal exudate.  Eyes: Pupils are equal, round, and reactive to light. Conjunctivae and EOM are normal.  Neck: Normal range of motion. Neck supple. No JVD present. No tracheal deviation present. No thyromegaly present.  Cardiovascular: Normal rate, regular rhythm, normal heart sounds and intact distal pulses. Exam reveals no gallop and no friction rub.  No murmur heard. Pulses:      Dorsalis pedis pulses are 2+ on the right side, and 2+ on the left side.       Posterior tibial pulses are 2+ on the right side, and 2+ on the left side.  Pulmonary/Chest: Effort normal and breath sounds normal. No respiratory distress. She has no wheezes. She has no  rales. She exhibits no tenderness. Right breast exhibits no inverted nipple, no mass, no nipple discharge, no skin change and no tenderness. Left breast exhibits no inverted nipple, no mass, no nipple discharge, no skin change and no tenderness.  Abdominal: Soft. Bowel sounds are normal. There is no tenderness.  Genitourinary: Vagina normal.  Genitourinary Comments: No tenderness, masses, or organomeglay present during bimanual exam .  Musculoskeletal:       Right foot: There is normal range of motion and no deformity.       Left foot: There is normal range of motion and no deformity.  Feet:  Right Foot:  Protective Sensation: 10 sites tested. 10 sites sensed.  Skin Integrity: Negative for erythema, warmth or callus.  Left Foot:  Protective Sensation: 10 sites tested. 10 sites sensed.  Skin Integrity: Negative for erythema, warmth or callus.  Lymphadenopathy:    She has no cervical adenopathy.  Neurological: She is alert and oriented to person, place, and time. No cranial nerve deficit.  Skin: Skin is warm and dry. Capillary refill takes less than 2 seconds. She is not diaphoretic.  Psychiatric: She has a normal mood and affect. Her behavior is normal. Judgment and thought content normal.  Nursing note and vitals reviewed.  LABS: Recent Results (from the past 2160 hour(s))  UA/M w/rflx Culture, Routine     Status: None   Collection Time: 09/15/17 12:00 AM  Result Value Ref Range   Specific Gravity, UA 1.023 1.005 - 1.030   pH, UA 6.0 5.0 - 7.5   Color, UA Yellow Yellow   Appearance Ur Clear Clear   Leukocytes, UA Negative Negative   Protein, UA Negative Negative/Trace   Glucose, UA Negative Negative   Ketones, UA Negative Negative   RBC, UA Negative Negative   Bilirubin, UA Negative Negative   Urobilinogen, Ur 0.2 0.2 - 1.0 mg/dL   Nitrite, UA Negative Negative   Microscopic Examination Comment     Comment: Microscopic follows if indicated.   Microscopic Examination See  below:     Comment: Microscopic was indicated and was performed.   Urinalysis Reflex Comment     Comment: This specimen will not reflex to a Urine Culture.  Microalbumin, urine     Status: None   Collection Time: 09/15/17 12:00 AM  Result Value Ref Range   Microalbumin, Urine 14.2 Not Estab. ug/mL  Pap IG and HPV (high risk) DNA detection     Status: None   Collection Time: 09/15/17 12:00 AM  Result Value Ref Range   DIAGNOSIS: Comment     Comment: NEGATIVE FOR INTRAEPITHELIAL LESION OR MALIGNANCY.   Specimen adequacy: Comment     Comment: Satisfactory for evaluation. No endocervical cells are present. This is consistent with a history of hysterectomy.    Clinician Provided ICD10 Comment     Comment: Z12.4   Performed by: Comment     Comment: Wynona Meals, Cytotechnologist (ASCP)   PAP Smear Comment .    Note: Comment     Comment: The Pap smear is a screening test designed to aid in the detection of premalignant and malignant conditions of the uterine cervix.  It is not a diagnostic procedure and should not be used as the sole means of detecting cervical cancer.  Both false-positive and false-negative reports do occur.    Test Methodology Comment     Comment: This liquid based ThinPrep(R) pap test was screened with the use of an image guided system.    HPV, high-risk Negative Negative    Comment: This high-risk HPV test detects thirteen high-risk types (16/18/31/33/35/39/45/51/52/56/58/59/68) without differentiation.   Microscopic Examination     Status: None   Collection Time: 09/15/17 12:00 AM  Result Value Ref Range   WBC, UA 0-5 0 - 5 /hpf   RBC, UA 0-2 0 - 2 /hpf   Epithelial Cells (non renal) 0-10 0 - 10 /hpf   Casts None seen None seen /lpf   Mucus, UA Present Not Estab.   Bacteria, UA None seen None seen/Few  POCT HgB A1C     Status: Abnormal   Collection Time: 09/15/17 11:25 AM  Result Value Ref Range   Hemoglobin A1C 6.8 (A) 4.0 - 5.6 %   HbA1c POC (<>  result, manual entry)  4.0 - 5.6 %   HbA1c, POC (prediabetic range)  5.7 - 6.4 %   HbA1c, POC (controlled diabetic range)  0.0 - 7.0 %    Assessment/Plan: 1. Encounter for general adult medical examination with abnormal findings Annual health maintenance exam today  2. Uncontrolled type 2 diabetes mellitus with hyperglycemia (HCC) - POCT HgB A1C 6.8 today. Continue diabetic medication as prescribed.  - Microalbumin, urine  3. Need for vaccination against Streptococcus pneumoniae using pneumococcal conjugate vaccine  7 - pneumococcal 23 valent vaccine (PNEUMOVAX 23) 25 MCG/0.5ML injection; Inject 0.29ml IM once  Dispense: 0.5 mL; Refill: 0  4. Screening for malignant neoplasm of cervix - Pap IG and HPV (high risk) DNA detection  5. Dysuria - UA/M w/rflx Culture, Routine  General Counseling: Minahil verbalizes understanding of the findings of todays visit and agrees with plan of treatment. I have discussed any further diagnostic evaluation that may be needed or ordered today. We also reviewed her medications today. she has been encouraged to call the office with any questions or concerns that should arise related to todays visit.    Counseling:  Diabetes Counseling:  1. Addition of ACE inh/ ARB'S for nephroprotection. Microalbumin is updated  2. Diabetic foot care, prevention of complications. Podiatry consult 3. Exercise and lose weight.  4. Diabetic eye examination, Diabetic eye exam is updated  5. Monitor blood sugar closlely. nutrition counseling.  6. Sign and symptoms of hypoglycemia including shaking sweating,confusion and headaches.   This patient was seen by Leretha Pol FNP Collaboration with Dr Lavera Guise as a part of collaborative care agreement  Orders Placed This Encounter  Procedures  . Microscopic Examination  . UA/M w/rflx Culture, Routine  . Microalbumin, urine  . POCT HgB A1C    Meds ordered this encounter  Medications  . pneumococcal 23 valent vaccine  (PNEUMOVAX 23) 25 MCG/0.5ML injection    Sig: Inject 0.28ml IM once    Dispense:  0.5 mL    Refill:  0    Order Specific Question:   Supervising Provider    Answer:   Lavera Guise [[id number]]    Time spent: Iraan, MD  Internal Medicine

## 2017-09-16 LAB — UA/M W/RFLX CULTURE, ROUTINE
Bilirubin, UA: NEGATIVE
Glucose, UA: NEGATIVE
Ketones, UA: NEGATIVE
LEUKOCYTES UA: NEGATIVE
Nitrite, UA: NEGATIVE
PH UA: 6 (ref 5.0–7.5)
PROTEIN UA: NEGATIVE
RBC, UA: NEGATIVE
Specific Gravity, UA: 1.023 (ref 1.005–1.030)
Urobilinogen, Ur: 0.2 mg/dL (ref 0.2–1.0)

## 2017-09-16 LAB — MICROSCOPIC EXAMINATION
BACTERIA UA: NONE SEEN
CASTS: NONE SEEN /LPF

## 2017-09-16 LAB — MICROALBUMIN, URINE: Microalbumin, Urine: 14.2 ug/mL

## 2017-09-20 LAB — PAP IG AND HPV HIGH-RISK
HPV, high-risk: NEGATIVE
PAP Smear Comment: 0

## 2017-10-03 ENCOUNTER — Ambulatory Visit
Admission: RE | Admit: 2017-10-03 | Discharge: 2017-10-03 | Disposition: A | Discharge status: Home / Self Care | Payer: BC Managed Care – PPO | Source: Ambulatory Visit | Attending: Internal Medicine | Admitting: Internal Medicine

## 2017-10-03 DIAGNOSIS — Z1231 Encounter for screening mammogram for malignant neoplasm of breast: Secondary | ICD-10-CM | POA: Diagnosis not present

## 2017-10-08 DIAGNOSIS — R300 Dysuria: Secondary | ICD-10-CM | POA: Insufficient documentation

## 2017-10-08 DIAGNOSIS — Z0001 Encounter for general adult medical examination with abnormal findings: Secondary | ICD-10-CM | POA: Insufficient documentation

## 2017-10-08 DIAGNOSIS — Z124 Encounter for screening for malignant neoplasm of cervix: Secondary | ICD-10-CM | POA: Insufficient documentation

## 2017-10-08 DIAGNOSIS — Z23 Encounter for immunization: Secondary | ICD-10-CM | POA: Insufficient documentation

## 2017-10-12 ENCOUNTER — Other Ambulatory Visit: Payer: Self-pay

## 2017-10-12 DIAGNOSIS — E1165 Type 2 diabetes mellitus with hyperglycemia: Secondary | ICD-10-CM

## 2017-10-12 MED ORDER — LIRAGLUTIDE 18 MG/3ML ~~LOC~~ SOPN: PEN_INJECTOR | SUBCUTANEOUS | 3 refills | Status: DC

## 2017-11-30 ENCOUNTER — Other Ambulatory Visit: Payer: Self-pay | Admitting: Nurse Practitioner

## 2017-11-30 DIAGNOSIS — B001 Herpesviral vesicular dermatitis: Secondary | ICD-10-CM

## 2017-11-30 MED ORDER — VALACYCLOVIR HCL 1 G PO TABS: ORAL_TABLET | ORAL | 2 refills | Status: DC

## 2018-01-22 ENCOUNTER — Ambulatory Visit: Payer: BC Managed Care – PPO | Admitting: Nurse Practitioner

## 2018-01-22 ENCOUNTER — Encounter: Payer: Self-pay | Admitting: Nurse Practitioner

## 2018-01-22 VITALS — BP 123/86 | HR 87 | Resp 16 | Ht 65.0 in | Wt 176.4 lb

## 2018-01-22 DIAGNOSIS — Z23 Encounter for immunization: Secondary | ICD-10-CM

## 2018-01-22 DIAGNOSIS — E1165 Type 2 diabetes mellitus with hyperglycemia: Secondary | ICD-10-CM

## 2018-01-22 DIAGNOSIS — B9689 Other specified bacterial agents as the cause of diseases classified elsewhere: Secondary | ICD-10-CM

## 2018-01-22 DIAGNOSIS — N760 Acute vaginitis: Secondary | ICD-10-CM

## 2018-01-22 LAB — POCT GLYCOSYLATED HEMOGLOBIN (HGB A1C): Hemoglobin A1C: 6.2 % — AB (ref 4.0–5.6)

## 2018-01-22 MED ORDER — METRONIDAZOLE 0.75 % VA GEL: 1.0000 | Freq: Two times a day (BID) | VAGINAL | 0 refills | Status: DC

## 2018-01-22 NOTE — Progress Notes (Signed)
[facility] [place], [place] [address]  Internal MEDICINE  Office Visit Note  Patient Name: Mandy Shea  [id number]  [id number]  Date of Service: 01/24/2018  Chief Complaint  Patient presents with  . Medical Management of Chronic Issues    92month follow up  . Diabetes  . Quality Metric Gaps    pneumonia vaccine, eye exam    Patient arrives for her 3 months diabetes follow up. Patient reports that she checks her blood sugars once or twice a day. It stays between 75-82 in the mornings. Her A1C is 6.2 today. Patient decreased her carbohydrate intake and does not eat sweets. Patient also noticed some "crawling like feeling" to her right leg and some intermittent numbness in her big right toe. Patient denies any associated pain with it or any rashes or wounds. Patient advised to watch the feet and legs closely for any wounds and rashes. Patient also reports vaginal discharge that started about a year ago after her hysterectomy. The discharge comes and goes, has thin yellowish consistency and odor. Patient denies any pain , itching or blood during urination or wiping. Patient needs an eye exam this year. She verbalizes the importance of it. Otherwise, patient does not have any other concerns or questions at this time.       Current Medication: Outpatient Encounter Medications as of 01/22/2018  Medication Sig  . b complex vitamins tablet Take 1 tablet by mouth daily.  . BD PEN NEEDLE NANO U/F 32G X 4 MM MISC   . diclofenac sodium (VOLTAREN) 1 % GEL Apply 4 g topically 4 (four) times daily.  Marland Kitchen liraglutide (VICTOZA) 18 MG/3ML SOPN INJECT 1.2MG  SUBCUTANEOUSLY EVERY DAY  . loratadine (CLARITIN) 10 MG tablet Take 1 tablet (10 mg total) by mouth daily.  . metFORMIN (GLUCOPHAGE-XR) 500 MG 24 hr tablet TAKE 1 TABLET BY MOUTH EVERY DAY FOR DIABETES  . montelukast (SINGULAIR) 10 MG tablet Take 1 tablet (10 mg total) by mouth daily.  . valACYclovir (VALTREX) 1000 MG  tablet Take 1 tablet po BID for 5 days, then take 1 tablet po QD to prevent fever blisters.  . [DISCONTINUED] metFORMIN (GLUCOPHAGE) 500 MG tablet Take 500 mg by mouth every evening.  . metroNIDAZOLE (METROGEL) 0.75 % vaginal gel Place 1 Applicatorful vaginally 2 (two) times daily.  . [DISCONTINUED] azithromycin (ZITHROMAX) 250 MG tablet z-pack - take as directed for 5 days (Patient not taking: Reported on 01/22/2018)  . [DISCONTINUED] pneumococcal 23 valent vaccine (PNEUMOVAX 23) 25 MCG/0.5ML injection Inject 0.63ml IM once   No facility-administered encounter medications on file as of 01/22/2018.     Surgical History: Past Surgical History:  Procedure Laterality Date  . CHOLECYSTECTOMY  09-26-13  . CYSTOSCOPY N/A 01/12/2017   Procedure: CYSTOSCOPY;  Surgeon: Malachy Mood, MD;  Location: [facility];  Service: Gynecology;  Laterality: N/A;  . HYSTEROSCOPY W/D&C  11/24/2016   Procedure: DILATATION AND CURETTAGE /HYSTEROSCOPY;  Surgeon: Malachy Mood, MD;  Location: [facility] OR[facility];  Service: Gynecology;;  . KNEE SURGERY Right   . LEEP  2010   [facility]  . TONSILLECTOMY  2014  . TOTAL LAPAROSCOPIC HYSTERECTOMY WITH SALPINGECTOMY Right 01/12/2017   Procedure: TOTAL LAPAROSCOPIC HYSTERECTOMY WITH RIGHT SALPINGECTOMY;  Surgeon: Malachy Mood, MD;  Location: [facility] OR[facility];  Service: Gynecology;  Laterality: Right;  . TUBAL LIGATION  1997    Medical History: Past Medical History:  Diagnosis Date  . Anemia    AS A CHILD  . Diabetes mellitus without complication (  HCC)   . Fibroids   . Hemorrhoids   . Seasonal allergies   . Sleep apnea    NO CPAP    Family History: Family History  Problem Relation Age of Onset  . Breast cancer Neg Hx     Social History   Socioeconomic History  . Marital status: Married    Spouse name: Not on file  . Number of children: Not on file  . Years of education: Not on file  . Highest education level: Not on file  Occupational History  . Not on file   Social Needs  . Financial resource strain: Not on file  . Food insecurity:    Worry: Not on file    Inability: Not on file  . Transportation needs:    Medical: Not on file    Non-medical: Not on file  Tobacco Use  . Smoking status: Never Smoker  . Smokeless tobacco: Never Used  Substance and Sexual Activity  . Alcohol use: No  . Drug use: No  . Sexual activity: Yes    Birth control/protection: None  Lifestyle  . Physical activity:    Days per week: Not on file    Minutes per session: Not on file  . Stress: Not on file  Relationships  . Social connections:    Talks on phone: Not on file    Gets together: Not on file    Attends religious service: Not on file    Active member of club or organization: Not on file    Attends meetings of clubs or organizations: Not on file    Relationship status: Not on file  . Intimate partner violence:    Fear of current or ex partner: Not on file    Emotionally abused: Not on file    Physically abused: Not on file    Forced sexual activity: Not on file  Other Topics Concern  . Not on file  Social History Narrative  . Not on file      Review of Systems  Constitutional: Negative for activity change, appetite change, chills, fatigue, fever and unexpected weight change.  HENT: Negative for congestion, ear pain, facial swelling, hearing loss, postnasal drip, rhinorrhea, sinus pressure, sinus pain, sore throat, trouble swallowing and voice change.   Eyes: Negative for photophobia, pain, discharge, redness, itching and visual disturbance.  Respiratory: Negative for apnea, cough, chest tightness, shortness of breath and wheezing.   Cardiovascular: Negative for chest pain, palpitations and leg swelling.  Gastrointestinal: Negative for abdominal distention, abdominal pain, anal bleeding, blood in stool, constipation, diarrhea, nausea and vomiting.  Endocrine: Negative for cold intolerance, heat intolerance, polydipsia, polyphagia and polyuria.        Blood sugars doing well   Genitourinary: Positive for vaginal discharge. Negative for dysuria, flank pain, frequency, hematuria, pelvic pain, vaginal bleeding and vaginal pain.  Musculoskeletal: Negative for arthralgias, back pain, gait problem, joint swelling, neck pain and neck stiffness.  Skin: Negative for color change, pallor, rash and wound.  Allergic/Immunologic: Negative for environmental allergies.  Neurological: Negative for dizziness, tremors, seizures, syncope, facial asymmetry, weakness, light-headedness, numbness and headaches.  Hematological: Negative for adenopathy.  Psychiatric/Behavioral: Negative.     Vital Signs: BP 123/86 (BP Location: Right Arm, Patient Position: Sitting, Cuff Size: Large)   Pulse 87   Resp 16   Ht 5\' 5"  (1.651 m)   Wt 176 lb 6.4 oz (80 kg)   LMP 12/28/2016   SpO2 96%   BMI 29.35 kg/m  Physical Exam  Constitutional: She is oriented to person, place, and time. She appears well-developed and well-nourished. No distress.  HENT:  Head: Normocephalic and atraumatic.  Eyes: Pupils are equal, round, and reactive to light. Conjunctivae and EOM are normal. Right eye exhibits no discharge. Left eye exhibits no discharge. No scleral icterus.  Neck: Normal range of motion. Neck supple. No thyromegaly present.  Cardiovascular: Normal rate, regular rhythm and normal heart sounds. Exam reveals no gallop and no friction rub.  No murmur heard. Pulmonary/Chest: Effort normal and breath sounds normal. No respiratory distress. She has no wheezes. She has no rales. She exhibits no tenderness.  Abdominal: Soft. Bowel sounds are normal. She exhibits no distension. There is no tenderness. There is no guarding.  Musculoskeletal: Normal range of motion. She exhibits no edema, tenderness or deformity.  Lymphadenopathy:    She has no cervical adenopathy.  Neurological: She is alert and oriented to person, place, and time.  Skin: Skin is warm and dry. No rash  noted. She is not diaphoretic. No erythema. No pallor.  Psychiatric: She has a normal mood and affect. Her behavior is normal. Judgment and thought content normal.  Nursing note and vitals reviewed.   Assessment/Plan: 1. Uncontrolled type 2 diabetes mellitus with hyperglycemia (HCC) Stable A1C 6.2. Continue with metformin as prescribed. conitnue with diet and exercise I ndaily routine  - POCT HgB A1C  2. Bacterial vaginitis Vaginal thin yellow discharge with odor. Will treat with metronidazole vaginal gel night for 5 nights. Reassess if no improvement.  - metroNIDAZOLE (METROGEL) 0.75 % vaginal gel; Place 1 Applicatorful vaginally 2 (two) times daily.  Dispense: 70 g; Refill: 0  3. Need for Tdap vaccination Need update on vaccination. Prescription given  - Tdap vaccine greater than or equal to 7yo IM; Future     General Counseling: Sahvanna verbalizes understanding of the findings of todays visit and agrees with plan of treatment. I have discussed any further diagnostic evaluation that may be needed or ordered today. We also reviewed her medications today. she has been encouraged to call the office with any questions or concerns that should arise related to todays visit.  Diabetes Counseling:  1. Addition of ACE inh/ ARB'S for nephroprotection. Microalbumin is updated  2. Diabetic foot care, prevention of complications. Podiatry consult 3. Exercise and lose weight.  4. Diabetic eye examination, Diabetic eye exam is updated  5. Monitor blood sugar closlely. nutrition counseling.  6. Sign and symptoms of hypoglycemia including shaking sweating,confusion and headaches.  This patient was seen by Leretha Pol FNP Collaboration with Dr Lavera Guise as a part of collaborative care agreement  Orders Placed This Encounter  Procedures  . Tdap vaccine greater than or equal to 7yo IM  . POCT HgB A1C    Meds ordered this encounter  Medications  . metroNIDAZOLE (METROGEL) 0.75 % vaginal gel     Sig: Place 1 Applicatorful vaginally 2 (two) times daily.    Dispense:  70 g    Refill:  0    Order Specific Question:   Supervising Provider    Answer:   Lavera Guise [[id number]]    Time spent: 48 Minutes      Dr Lavera Guise Internal medicine

## 2018-01-24 DIAGNOSIS — N760 Acute vaginitis: Secondary | ICD-10-CM

## 2018-01-24 DIAGNOSIS — B9689 Other specified bacterial agents as the cause of diseases classified elsewhere: Secondary | ICD-10-CM | POA: Insufficient documentation

## 2018-02-16 ENCOUNTER — Ambulatory Visit: Payer: BC Managed Care – PPO | Admitting: Adult Health

## 2018-02-16 ENCOUNTER — Encounter: Payer: Self-pay | Admitting: Adult Health

## 2018-02-16 VITALS — BP 122/92 | HR 90 | Temp 97.8°F | Resp 16 | Ht 64.0 in | Wt 175.0 lb

## 2018-02-16 DIAGNOSIS — R6889 Other general symptoms and signs: Secondary | ICD-10-CM

## 2018-02-16 DIAGNOSIS — J069 Acute upper respiratory infection, unspecified: Secondary | ICD-10-CM

## 2018-02-16 LAB — POCT INFLUENZA A/B
INFLUENZA A, POC: NEGATIVE
Influenza B, POC: NEGATIVE

## 2018-02-16 MED ORDER — AZITHROMYCIN 250 MG PO TABS: ORAL_TABLET | ORAL | 0 refills | Status: DC

## 2018-02-16 NOTE — Patient Instructions (Signed)

## 2018-02-16 NOTE — Progress Notes (Signed)
[facility] [place], [place] [address]  Internal MEDICINE  Office Visit Note  Patient Name: Mandy Shea  [id number]  [id number]  Date of Service: 02/21/2018  Chief Complaint  Patient presents with  . Cough  . Sinusitis     HPI Pt is here for a sick visit. Pt reports cough, sinus drainage, and sore chest with coughing. She states she did have chills a few times, however she is unsure if she had a temperature.       Current Medication:  Outpatient Encounter Medications as of 02/16/2018  Medication Sig  . azithromycin (ZITHROMAX) 250 MG tablet Take as directed.  Marland Kitchen b complex vitamins tablet Take 1 tablet by mouth daily.  . BD PEN NEEDLE NANO U/F 32G X 4 MM MISC   . diclofenac sodium (VOLTAREN) 1 % GEL Apply 4 g topically 4 (four) times daily.  Marland Kitchen liraglutide (VICTOZA) 18 MG/3ML SOPN INJECT 1.2MG  SUBCUTANEOUSLY EVERY DAY  . loratadine (CLARITIN) 10 MG tablet Take 1 tablet (10 mg total) by mouth daily.  . metFORMIN (GLUCOPHAGE-XR) 500 MG 24 hr tablet TAKE 1 TABLET BY MOUTH EVERY DAY FOR DIABETES  . metroNIDAZOLE (METROGEL) 0.75 % vaginal gel Place 1 Applicatorful vaginally 2 (two) times daily.  . montelukast (SINGULAIR) 10 MG tablet Take 1 tablet (10 mg total) by mouth daily.  . valACYclovir (VALTREX) 1000 MG tablet Take 1 tablet po BID for 5 days, then take 1 tablet po QD to prevent fever blisters.   No facility-administered encounter medications on file as of 02/16/2018.       Medical History: Past Medical History:  Diagnosis Date  . Anemia    AS A CHILD  . Diabetes mellitus without complication ([place])   . Fibroids   . Hemorrhoids   . Seasonal allergies   . Sleep apnea    NO CPAP     Vital Signs: BP (!) 122/92   Pulse 90   Temp 97.8 F (36.6 C)   Resp 16   Ht 5\' 4"  (1.626 m)   Wt 175 lb (79.4 kg)   LMP 12/28/2016   SpO2 94%   BMI 30.04 kg/m    Review of Systems  Constitutional: Negative for chills, fatigue and  unexpected weight change.  HENT: Negative for congestion, rhinorrhea, sneezing and sore throat.   Eyes: Negative for photophobia, pain and redness.  Respiratory: Negative for cough, chest tightness and shortness of breath.   Cardiovascular: Negative for chest pain and palpitations.  Gastrointestinal: Negative for abdominal pain, constipation, diarrhea, nausea and vomiting.  Endocrine: Negative.   Genitourinary: Negative for dysuria and frequency.  Musculoskeletal: Negative for arthralgias, back pain, joint swelling and neck pain.  Skin: Negative for rash.  Allergic/Immunologic: Negative.   Neurological: Negative for tremors and numbness.  Hematological: Negative for adenopathy. Does not bruise/bleed easily.  Psychiatric/Behavioral: Negative for behavioral problems and sleep disturbance. The patient is not nervous/anxious.     Physical Exam Vitals signs and nursing note reviewed.  Constitutional:      General: She is not in acute distress.    Appearance: She is well-developed. She is not diaphoretic.  HENT:     Head: Normocephalic and atraumatic.     Mouth/Throat:     Pharynx: No oropharyngeal exudate.  Eyes:     Pupils: Pupils are equal, round, and reactive to light.  Neck:     Musculoskeletal: Normal range of motion and neck supple.     Thyroid: No thyromegaly.  Vascular: No JVD.     Trachea: No tracheal deviation.  Cardiovascular:     Rate and Rhythm: Normal rate and regular rhythm.     Heart sounds: Normal heart sounds. No murmur. No friction rub. No gallop.   Pulmonary:     Effort: Pulmonary effort is normal. No respiratory distress.     Breath sounds: Normal breath sounds. No wheezing or rales.  Chest:     Chest wall: No tenderness.  Abdominal:     Palpations: Abdomen is soft.     Tenderness: There is no abdominal tenderness. There is no guarding.  Musculoskeletal: Normal range of motion.  Lymphadenopathy:     Cervical: No cervical adenopathy.  Skin:    General:  Skin is warm and dry.  Neurological:     Mental Status: She is alert and oriented to person, place, and time.     Cranial Nerves: No cranial nerve deficit.  Psychiatric:        Behavior: Behavior normal.        Thought Content: Thought content normal.        Judgment: Judgment normal.     Assessment/Plan: 1. Upper respiratory tract infection, unspecified type Pt will take antibiotics as prescribed. Instructed patient to return to clinic is symptoms failed to improve in 7-10 days.  - azithromycin (ZITHROMAX) 250 MG tablet; Take as directed.  Dispense: 6 tablet; Refill: 0  2. Flu-like symptoms Flu Negative - POCT Influenza A/B  General Counseling: Lanay verbalizes understanding of the findings of todays visit and agrees with plan of treatment. I have discussed any further diagnostic evaluation that may be needed or ordered today. We also reviewed her medications today. she has been encouraged to call the office with any questions or concerns that should arise related to todays visit.   Orders Placed This Encounter  Procedures  . POCT Influenza A/B    Meds ordered this encounter  Medications  . azithromycin (ZITHROMAX) 250 MG tablet    Sig: Take as directed.    Dispense:  6 tablet    Refill:  0    Time spent: 25 Minutes  This patient was seen by Orson Gear AGNP-C in Collaboration with Dr Lavera Guise as a part of collaborative care agreement.  Kendell Bane AGNP-C Internal Medicine

## 2018-03-02 ENCOUNTER — Other Ambulatory Visit: Payer: Self-pay

## 2018-03-02 MED ORDER — FLUCONAZOLE 150 MG PO TABS: 150.0000 mg | ORAL_TABLET | Freq: Every day | ORAL | 0 refills | Status: DC

## 2018-03-02 NOTE — Telephone Encounter (Signed)
Pt called she having yeast infections because is on antibiotic as per adam send diflucan

## 2018-03-26 ENCOUNTER — Other Ambulatory Visit: Payer: Self-pay | Admitting: Internal Medicine

## 2018-04-05 ENCOUNTER — Ambulatory Visit: Payer: Self-pay | Admitting: Nurse Practitioner

## 2018-04-26 ENCOUNTER — Other Ambulatory Visit: Payer: Self-pay

## 2018-04-26 MED ORDER — MONTELUKAST SODIUM 10 MG PO TABS: 10.0000 mg | ORAL_TABLET | Freq: Every day | ORAL | 5 refills | Status: DC

## 2018-05-14 ENCOUNTER — Other Ambulatory Visit: Payer: Self-pay

## 2018-05-14 DIAGNOSIS — E1165 Type 2 diabetes mellitus with hyperglycemia: Secondary | ICD-10-CM

## 2018-05-14 MED ORDER — LIRAGLUTIDE 18 MG/3ML ~~LOC~~ SOPN: PEN_INJECTOR | SUBCUTANEOUS | 3 refills | Status: DC

## 2018-05-25 ENCOUNTER — Ambulatory Visit: Payer: BC Managed Care – PPO | Admitting: Nurse Practitioner

## 2018-05-25 ENCOUNTER — Encounter: Payer: Self-pay | Admitting: Nurse Practitioner

## 2018-05-25 ENCOUNTER — Other Ambulatory Visit: Payer: Self-pay

## 2018-05-25 VITALS — BP 135/86 | HR 72 | Resp 16 | Ht 64.0 in | Wt 174.2 lb

## 2018-05-25 DIAGNOSIS — B3731 Acute candidiasis of vulva and vagina: Secondary | ICD-10-CM

## 2018-05-25 DIAGNOSIS — R319 Hematuria, unspecified: Secondary | ICD-10-CM

## 2018-05-25 DIAGNOSIS — B373 Candidiasis of vulva and vagina: Secondary | ICD-10-CM

## 2018-05-25 DIAGNOSIS — R300 Dysuria: Secondary | ICD-10-CM

## 2018-05-25 DIAGNOSIS — E1165 Type 2 diabetes mellitus with hyperglycemia: Secondary | ICD-10-CM

## 2018-05-25 DIAGNOSIS — N390 Urinary tract infection, site not specified: Secondary | ICD-10-CM | POA: Diagnosis not present

## 2018-05-25 LAB — POCT URINALYSIS DIPSTICK
Bilirubin, UA: NEGATIVE
GLUCOSE UA: NEGATIVE
Ketones, UA: NEGATIVE
Leukocytes, UA: NEGATIVE
Nitrite, UA: NEGATIVE
PH UA: 6 (ref 5.0–8.0)
Protein, UA: POSITIVE — AB
Spec Grav, UA: 1.025 (ref 1.010–1.025)
Urobilinogen, UA: 1 E.U./dL

## 2018-05-25 LAB — POCT GLYCOSYLATED HEMOGLOBIN (HGB A1C): HEMOGLOBIN A1C: 6.4 % — AB (ref 4.0–5.6)

## 2018-05-25 MED ORDER — FLUCONAZOLE 150 MG PO TABS: 150.0000 mg | ORAL_TABLET | Freq: Every day | ORAL | 0 refills | Status: DC

## 2018-05-25 MED ORDER — SULFAMETHOXAZOLE-TRIMETHOPRIM 800-160 MG PO TABS: 1.0000 | ORAL_TABLET | Freq: Two times a day (BID) | ORAL | 0 refills | Status: DC

## 2018-05-25 NOTE — Progress Notes (Signed)
[facility] [place], [place] [address]  Internal MEDICINE  Office Visit Note  Patient Name: Mandy Shea  [id number]  [id number]  Date of Service: 06/04/2018  Chief Complaint  Patient presents with  . Medical Management of Chronic Issues    4 month follow up  . Diabetes    A1C  . Urinary Tract Infection    urgency,but its only a tinkle, or possible yeast infection    The patient is reporting urinary frequency and dysuria which has been present for the past few days. Gradually worsening. Denies abdominal pain, nausea, vomiting, or fever.   Diabetes  She presents for her follow-up diabetic visit. She has type 2 diabetes mellitus. Her disease course has been stable. There are no hypoglycemic associated symptoms. Pertinent negatives for hypoglycemia include no dizziness, headaches, nervousness/anxiousness or tremors. There are no diabetic associated symptoms. Pertinent negatives for diabetes include no chest pain, no fatigue, no polydipsia and no polyuria. There are no hypoglycemic complications. Symptoms are stable. There are no diabetic complications. Risk factors for coronary artery disease include dyslipidemia, hypertension, post-menopausal and stress. Current diabetic treatment includes oral agent (monotherapy) (victoza). She is compliant with treatment all of the time. Her weight is stable. She is following a generally healthy diet. Meal planning includes avoidance of concentrated sweets. She has had a previous visit with a dietitian. She participates in exercise three times a week. There is no change in her home blood glucose trend. An ACE inhibitor/angiotensin II receptor blocker is not being taken. She does not see a podiatrist.Eye exam is current.       Current Medication: Outpatient Encounter Medications as of 05/25/2018  Medication Sig  . b complex vitamins tablet Take 1 tablet by mouth daily.  . BD PEN NEEDLE NANO U/F 32G X 4 MM MISC USE AS  DIRECTED WITH VICTOZA PEN  . diclofenac sodium (VOLTAREN) 1 % GEL Apply 4 g topically 4 (four) times daily.  Marland Kitchen liraglutide (VICTOZA) 18 MG/3ML SOPN INJECT 1.2MG  SUBCUTANEOUSLY EVERY DAY  . loratadine (CLARITIN) 10 MG tablet Take 1 tablet (10 mg total) by mouth daily.  . metFORMIN (GLUCOPHAGE-XR) 500 MG 24 hr tablet TAKE 1 TABLET BY MOUTH EVERY DAY FOR DIABETES  . metroNIDAZOLE (METROGEL) 0.75 % vaginal gel Place 1 Applicatorful vaginally 2 (two) times daily.  . montelukast (SINGULAIR) 10 MG tablet Take 1 tablet (10 mg total) by mouth daily.  . valACYclovir (VALTREX) 1000 MG tablet Take 1 tablet po BID for 5 days, then take 1 tablet po QD to prevent fever blisters.  Marland Kitchen azithromycin (ZITHROMAX) 250 MG tablet Take as directed. (Patient not taking: Reported on 05/25/2018)  . fluconazole (DIFLUCAN) 150 MG tablet Take 1 tablet (150 mg total) by mouth daily. Take 1 tab po once for yeast infection may repeat in 3 days if symptoms persists  . sulfamethoxazole-trimethoprim (BACTRIM DS,SEPTRA DS) 800-160 MG tablet Take 1 tablet by mouth 2 (two) times daily.  . [DISCONTINUED] fluconazole (DIFLUCAN) 150 MG tablet Take 1 tablet (150 mg total) by mouth daily. Take 1 tab po once for yeast infection may repeat in 3 days if symptoms persists (Patient not taking: Reported on 05/25/2018)   No facility-administered encounter medications on file as of 05/25/2018.     Surgical History: Past Surgical History:  Procedure Laterality Date  . CHOLECYSTECTOMY  09-26-13  . CYSTOSCOPY N/A 01/12/2017   Procedure: CYSTOSCOPY;  Surgeon: Malachy Mood, MD;  Location: [facility] ORS;  Service: Gynecology;  Laterality: N/A;  .  HYSTEROSCOPY W/D&C  11/24/2016   Procedure: DILATATION AND CURETTAGE /HYSTEROSCOPY;  Surgeon: Malachy Mood, MD;  Location: [facility] ORS;  Service: Gynecology;;  . KNEE SURGERY Right   . LEEP  2010   [facility]  . TONSILLECTOMY  2014  . TOTAL LAPAROSCOPIC HYSTERECTOMY WITH SALPINGECTOMY Right 01/12/2017    Procedure: TOTAL LAPAROSCOPIC HYSTERECTOMY WITH RIGHT SALPINGECTOMY;  Surgeon: Malachy Mood, MD;  Location: [facility] ORS;  Service: Gynecology;  Laterality: Right;  . TUBAL LIGATION  1997    Medical History: Past Medical History:  Diagnosis Date  . Anemia    AS A CHILD  . Diabetes mellitus without complication ([place])   . Fibroids   . Hemorrhoids   . Seasonal allergies   . Sleep apnea    NO CPAP    Family History: Family History  Problem Relation Age of Onset  . Breast cancer Neg Hx     Social History   Socioeconomic History  . Marital status: Married    Spouse name: Not on file  . Number of children: Not on file  . Years of education: Not on file  . Highest education level: Not on file  Occupational History  . Not on file  Social Needs  . Financial resource strain: Not on file  . Food insecurity:    Worry: Not on file    Inability: Not on file  . Transportation needs:    Medical: Not on file    Non-medical: Not on file  Tobacco Use  . Smoking status: Never Smoker  . Smokeless tobacco: Never Used  Substance and Sexual Activity  . Alcohol use: No  . Drug use: No  . Sexual activity: Yes    Birth control/protection: None  Lifestyle  . Physical activity:    Days per week: Not on file    Minutes per session: Not on file  . Stress: Not on file  Relationships  . Social connections:    Talks on phone: Not on file    Gets together: Not on file    Attends religious service: Not on file    Active member of club or organization: Not on file    Attends meetings of clubs or organizations: Not on file    Relationship status: Not on file  . Intimate partner violence:    Fear of current or ex partner: Not on file    Emotionally abused: Not on file    Physically abused: Not on file    Forced sexual activity: Not on file  Other Topics Concern  . Not on file  Social History Narrative  . Not on file      Review of Systems  Constitutional: Negative for activity  change, chills, fatigue and unexpected weight change.  HENT: Negative for congestion, postnasal drip, rhinorrhea, sneezing and sore throat.   Respiratory: Negative for cough, chest tightness, shortness of breath and wheezing.   Cardiovascular: Negative for chest pain and palpitations.  Gastrointestinal: Negative for abdominal pain, constipation, diarrhea, nausea and vomiting.  Endocrine: Negative for cold intolerance, heat intolerance, polydipsia and polyuria.       Blood sugars doing well   Genitourinary: Positive for dysuria and frequency.  Musculoskeletal: Negative for back pain, joint swelling and neck pain.  Skin: Negative for rash.  Allergic/Immunologic: Negative for environmental allergies.  Neurological: Negative for dizziness, tremors, numbness and headaches.  Hematological: Negative for adenopathy. Does not bruise/bleed easily.  Psychiatric/Behavioral: Negative for behavioral problems (Depression), dysphoric mood, sleep disturbance and suicidal ideas. The patient is  not nervous/anxious.     Today's Vitals   05/25/18 1502  BP: 135/86  Pulse: 72  Resp: 16  SpO2: 96%  Weight: 174 lb 3.2 oz (79 kg)  Height: 5\' 4"  (1.626 m)   Body mass index is 29.9 kg/m.  Physical Exam Vitals signs and nursing note reviewed.  Constitutional:      General: She is not in acute distress.    Appearance: Normal appearance. She is well-developed. She is not diaphoretic.  HENT:     Head: Normocephalic and atraumatic.  Eyes:     General: No scleral icterus.       Right eye: No discharge.        Left eye: No discharge.     Conjunctiva/sclera: Conjunctivae normal.     Pupils: Pupils are equal, round, and reactive to light.  Neck:     Musculoskeletal: Normal range of motion and neck supple.     Thyroid: No thyromegaly.  Cardiovascular:     Rate and Rhythm: Normal rate and regular rhythm.     Heart sounds: Normal heart sounds. No murmur. No friction rub. No gallop.   Pulmonary:     Effort:  Pulmonary effort is normal. No respiratory distress.     Breath sounds: Normal breath sounds. No wheezing or rales.  Chest:     Chest wall: No tenderness.  Abdominal:     General: Bowel sounds are normal. There is no distension.     Palpations: Abdomen is soft.     Tenderness: There is no abdominal tenderness. There is no guarding.  Genitourinary:    Comments: Urine positive for trace blood.  Musculoskeletal: Normal range of motion.        General: No tenderness or deformity.  Lymphadenopathy:     Cervical: No cervical adenopathy.  Skin:    General: Skin is warm and dry.     Coloration: Skin is not pale.     Findings: No erythema or rash.  Neurological:     Mental Status: She is alert and oriented to person, place, and time.  Psychiatric:        Behavior: Behavior normal.        Thought Content: Thought content normal.        Judgment: Judgment normal.    Assessment/Plan:  1. Uncontrolled type 2 diabetes mellitus with hyperglycemia (HCC) - POCT HgB A1C 6.4 today. Continue diabetic medication as prescribed   2. Urinary tract infection with hematuria, site unspecified Start bactrim DS bid for 7 days. Send urine for culture and sensitivity and adjust antibiotics a indicated.  - sulfamethoxazole-trimethoprim (BACTRIM DS,SEPTRA DS) 800-160 MG tablet; Take 1 tablet by mouth 2 (two) times daily.  Dispense: 14 tablet; Refill: 0  3. Vaginal candidiasis Diflucan may be taken as needed and as prescribed  - fluconazole (DIFLUCAN) 150 MG tablet; Take 1 tablet (150 mg total) by mouth daily. Take 1 tab po once for yeast infection may repeat in 3 days if symptoms persists  Dispense: 3 tablet; Refill: 0  4. Dysuria - POCT Urinalysis Dipstick - CULTURE, URINE COMPREHENSIVE  General Counseling: Corretta verbalizes understanding of the findings of todays visit and agrees with plan of treatment. I have discussed any further diagnostic evaluation that may be needed or ordered today. We also reviewed  her medications today. she has been encouraged to call the office with any questions or concerns that should arise related to todays visit.  Diabetes Counseling:  1. Addition of ACE inh/ ARB'S for nephroprotection.  Microalbumin is updated  2. Diabetic foot care, prevention of complications. Podiatry consult 3. Exercise and lose weight.  4. Diabetic eye examination, Diabetic eye exam is updated  5. Monitor blood sugar closlely. nutrition counseling.  6. Sign and symptoms of hypoglycemia including shaking sweating,confusion and headaches.  This patient was seen by Leretha Pol FNP Collaboration with Dr Lavera Guise as a part of collaborative care agreement  Orders Placed This Encounter  Procedures  . CULTURE, URINE COMPREHENSIVE  . POCT HgB A1C  . POCT Urinalysis Dipstick    Time spent: 25 Minutes      Dr Lavera Guise Internal medicine

## 2018-05-28 LAB — CULTURE, URINE COMPREHENSIVE

## 2018-06-04 DIAGNOSIS — B3731 Acute candidiasis of vulva and vagina: Secondary | ICD-10-CM | POA: Insufficient documentation

## 2018-06-04 DIAGNOSIS — N390 Urinary tract infection, site not specified: Secondary | ICD-10-CM | POA: Insufficient documentation

## 2018-06-04 DIAGNOSIS — R319 Hematuria, unspecified: Secondary | ICD-10-CM

## 2018-06-04 DIAGNOSIS — B373 Candidiasis of vulva and vagina: Secondary | ICD-10-CM | POA: Insufficient documentation

## 2018-07-03 ENCOUNTER — Other Ambulatory Visit: Payer: Self-pay | Admitting: Nurse Practitioner

## 2018-07-03 DIAGNOSIS — J3089 Other allergic rhinitis: Secondary | ICD-10-CM

## 2018-07-03 MED ORDER — LORATADINE 10 MG PO TABS: 10.0000 mg | ORAL_TABLET | Freq: Every day | ORAL | 5 refills | Status: DC

## 2018-08-06 ENCOUNTER — Other Ambulatory Visit: Payer: Self-pay

## 2018-08-06 DIAGNOSIS — B373 Candidiasis of vulva and vagina: Secondary | ICD-10-CM

## 2018-08-06 DIAGNOSIS — B3731 Acute candidiasis of vulva and vagina: Secondary | ICD-10-CM

## 2018-08-06 MED ORDER — FLUCONAZOLE 150 MG PO TABS: 150.0000 mg | ORAL_TABLET | Freq: Every day | ORAL | 0 refills | Status: DC

## 2018-08-06 NOTE — Telephone Encounter (Signed)
Pt called for refills for diflucan for yeast infection as per dr Humphrey Rolls send med

## 2018-08-16 ENCOUNTER — Telehealth: Payer: Self-pay | Admitting: *Deleted

## 2018-08-16 ENCOUNTER — Other Ambulatory Visit: Payer: BC Managed Care – PPO

## 2018-08-16 ENCOUNTER — Other Ambulatory Visit: Payer: Self-pay

## 2018-08-16 ENCOUNTER — Ambulatory Visit: Payer: BC Managed Care – PPO | Admitting: Internal Medicine

## 2018-08-16 ENCOUNTER — Encounter: Payer: Self-pay | Admitting: Internal Medicine

## 2018-08-16 VITALS — Resp 16 | Ht 64.0 in | Wt 178.0 lb

## 2018-08-16 DIAGNOSIS — Z20828 Contact with and (suspected) exposure to other viral communicable diseases: Secondary | ICD-10-CM

## 2018-08-16 DIAGNOSIS — Z20822 Contact with and (suspected) exposure to covid-19: Secondary | ICD-10-CM

## 2018-08-16 NOTE — Telephone Encounter (Addendum)
Contacted pt to schedule testing; pt offered and accepted appointment at [facility] site 6/18/[address]20 at 1325; pt given address, location, and instructions that she and all occupants of her vehicle should wear masks; she verbalized understanding; orders placed per protocol. ----- Message from Corlis Hove sent at 08/16/2018 12:07 PM EDT ----- Regarding: covid 19 test Can you please schedule her for covid 19 test [id number] Mandy Shea  [id number]

## 2018-08-16 NOTE — Progress Notes (Signed)
[facility] [place], [place] [address]  Internal MEDICINE  Telephone Visit  Patient Name: Mandy Shea  [id number]  [id number]  Date of Service: 08/16/2018  I connected with the patient at 1205 by telephone and verified the patients identity using two identifiers.   I discussed the limitations, risks, security and privacy concerns of performing an evaluation and management service by telephone and the availability of in person appointments. I also discussed with the patient that there may be a patient responsible charge related to the service.  The patient expressed understanding and agrees to proceed.    Chief Complaint  Patient presents with  . Telephone Screen  . Telephone Assessment  . Medical Management of Chronic Issues    exposure  to covid    HPI  Pt is connected via webcam for possible exposure to covis 19, she is asymptomatic at this moment, no fever or chills, no loss of taste or smell  Current Medication: Outpatient Encounter Medications as of 08/16/2018  Medication Sig  . azithromycin (ZITHROMAX) 250 MG tablet Take as directed. (Patient not taking: Reported on 05/25/2018)  . b complex vitamins tablet Take 1 tablet by mouth daily.  . BD PEN NEEDLE NANO U/F 32G X 4 MM MISC USE AS DIRECTED WITH VICTOZA PEN  . diclofenac sodium (VOLTAREN) 1 % GEL Apply 4 g topically 4 (four) times daily.  . fluconazole (DIFLUCAN) 150 MG tablet Take 1 tablet (150 mg total) by mouth daily. Take 1 tab po once for yeast infection may repeat in 3 days if symptoms persists  . liraglutide (VICTOZA) 18 MG/3ML SOPN INJECT 1.2MG  SUBCUTANEOUSLY EVERY DAY  . loratadine (CLARITIN) 10 MG tablet Take 1 tablet (10 mg total) by mouth daily.  . metFORMIN (GLUCOPHAGE-XR) 500 MG 24 hr tablet TAKE 1 TABLET BY MOUTH EVERY DAY FOR DIABETES  . metroNIDAZOLE (METROGEL) 0.75 % vaginal gel Place 1 Applicatorful vaginally 2 (two) times daily.  . montelukast (SINGULAIR) 10 MG tablet Take 1  tablet (10 mg total) by mouth daily.  Marland Kitchen sulfamethoxazole-trimethoprim (BACTRIM DS,SEPTRA DS) 800-160 MG tablet Take 1 tablet by mouth 2 (two) times daily.  . valACYclovir (VALTREX) 1000 MG tablet Take 1 tablet po BID for 5 days, then take 1 tablet po QD to prevent fever blisters.   No facility-administered encounter medications on file as of 08/16/2018.     Surgical History: Past Surgical History:  Procedure Laterality Date  . CHOLECYSTECTOMY  09-26-13  . CYSTOSCOPY N/A 01/12/2017   Procedure: CYSTOSCOPY;  Surgeon: Malachy Mood, MD;  Location: [facility] ORS;  Service: Gynecology;  Laterality: N/A;  . HYSTEROSCOPY W/D&C  11/24/2016   Procedure: DILATATION AND CURETTAGE /HYSTEROSCOPY;  Surgeon: Malachy Mood, MD;  Location: [facility] ORS;  Service: Gynecology;;  . KNEE SURGERY Right   . LEEP  2010   [facility]  . TONSILLECTOMY  2014  . TOTAL LAPAROSCOPIC HYSTERECTOMY WITH SALPINGECTOMY Right 01/12/2017   Procedure: TOTAL LAPAROSCOPIC HYSTERECTOMY WITH RIGHT SALPINGECTOMY;  Surgeon: Malachy Mood, MD;  Location: [facility] ORS;  Service: Gynecology;  Laterality: Right;  . TUBAL LIGATION  1997    Medical History: Past Medical History:  Diagnosis Date  . Anemia    AS A CHILD  . Diabetes mellitus without complication ([place])   . Fibroids   . Hemorrhoids   . Seasonal allergies   . Sleep apnea    NO CPAP    Family History: Family History  Problem Relation Age of Onset  . Breast cancer Neg Hx  Social History   Socioeconomic History  . Marital status: Married    Spouse name: Not on file  . Number of children: Not on file  . Years of education: Not on file  . Highest education level: Not on file  Occupational History  . Not on file  Social Needs  . Financial resource strain: Not on file  . Food insecurity    Worry: Not on file    Inability: Not on file  . Transportation needs    Medical: Not on file    Non-medical: Not on file  Tobacco Use  . Smoking status: Never Smoker   . Smokeless tobacco: Never Used  Substance and Sexual Activity  . Alcohol use: No  . Drug use: No  . Sexual activity: Yes    Birth control/protection: None  Lifestyle  . Physical activity    Days per week: Not on file    Minutes per session: Not on file  . Stress: Not on file  Relationships  . Social Herbalist on phone: Not on file    Gets together: Not on file    Attends religious service: Not on file    Active member of club or organization: Not on file    Attends meetings of clubs or organizations: Not on file    Relationship status: Not on file  . Intimate partner violence    Fear of current or ex partner: Not on file    Emotionally abused: Not on file    Physically abused: Not on file    Forced sexual activity: Not on file  Other Topics Concern  . Not on file  Social History Narrative  . Not on file      Review of Systems  Constitutional: Negative.   HENT: Negative.   Respiratory: Negative.   Cardiovascular: Negative.    Vital Signs: Resp 16   Ht 5\' 4"  (1.626 m)   Wt 178 lb (80.7 kg)   LMP 12/28/2016   BMI 30.55 kg/m   Observation/Objective: Pt is connected via web cam due to risk of epidemic of covid 19  Pt is in no distress,   Assessment/Plan: 1. Close Exposure to Covid-19 Virus - Order to designated lab for covid 19 is sent, pt was instructed to self isolate herself and monitor symptoms closely   General Counseling: Mandy Shea verbalizes understanding of the findings of today's phone visit and agrees with plan of treatment. I have discussed any further diagnostic evaluation that may be needed or ordered today. We also reviewed her medications today. she has been encouraged to call the office with any questions or concerns that should arise related to todays visit.  Time spent:10 Minutes   Dr Lavera Guise Internal medicine

## 2018-08-18 LAB — NOVEL CORONAVIRUS, NAA: SARS-CoV-2, NAA: NOT DETECTED

## 2018-09-17 ENCOUNTER — Encounter: Payer: Self-pay | Admitting: Adult Health

## 2018-09-17 ENCOUNTER — Ambulatory Visit: Payer: BC Managed Care – PPO | Admitting: Adult Health

## 2018-09-17 ENCOUNTER — Other Ambulatory Visit: Payer: Self-pay

## 2018-09-17 VITALS — BP 138/87 | HR 87 | Resp 16 | Ht 64.0 in | Wt 171.0 lb

## 2018-09-17 DIAGNOSIS — E1165 Type 2 diabetes mellitus with hyperglycemia: Secondary | ICD-10-CM | POA: Diagnosis not present

## 2018-09-17 DIAGNOSIS — Z0289 Encounter for other administrative examinations: Secondary | ICD-10-CM

## 2018-09-17 NOTE — Progress Notes (Signed)
[facility] [place], [place] [address]  Internal MEDICINE  Office Visit Note  Patient Name: Mandy Shea  [id number]  [id number]  Date of Service: 09/17/2018  Chief Complaint  Patient presents with  . 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 Her school is currently deciding whether to have remote learning only for the first 9 weeks, and if they decided to bring students back, she will need this paperwork to be exempt.    Current Medication: Outpatient Encounter Medications as of 09/17/2018  Medication Sig  . b complex vitamins tablet Take 1 tablet by mouth daily.  . BD PEN NEEDLE NANO U/F 32G X 4 MM MISC USE AS DIRECTED WITH VICTOZA PEN  . diclofenac sodium (VOLTAREN) 1 % GEL Apply 4 g topically 4 (four) times daily.  Marland Kitchen liraglutide (VICTOZA) 18 MG/3ML SOPN INJECT 1.2MG  SUBCUTANEOUSLY EVERY DAY  . loratadine (CLARITIN) 10 MG tablet Take 1 tablet (10 mg total) by mouth daily.  . metFORMIN (GLUCOPHAGE-XR) 500 MG 24 hr tablet TAKE 1 TABLET BY MOUTH EVERY DAY FOR DIABETES  . montelukast (SINGULAIR) 10 MG tablet Take 1 tablet (10 mg total) by mouth daily.  . valACYclovir (VALTREX) 1000 MG tablet Take 1 tablet po BID for 5 days, then take 1 tablet po QD to prevent fever blisters.   No facility-administered encounter medications on file as of 09/17/2018.     Surgical History: Past Surgical History:  Procedure Laterality Date  . CHOLECYSTECTOMY  09-26-13  . CYSTOSCOPY N/A 01/12/2017   Procedure: CYSTOSCOPY;  Surgeon: Malachy Mood, MD;  Location: [facility] ORS;  Service: Gynecology;  Laterality: N/A;  . HYSTEROSCOPY W/D&C  11/24/2016   Procedure: DILATATION AND CURETTAGE /HYSTEROSCOPY;  Surgeon: Malachy Mood, MD;  Location: [facility] OR[facility];  Service:  Gynecology;;  . KNEE SURGERY Right   . LEEP  2010   [facility]  . TONSILLECTOMY  2014  . TOTAL LAPAROSCOPIC HYSTERECTOMY WITH SALPINGECTOMY Right 01/12/2017   Procedure: TOTAL LAPAROSCOPIC HYSTERECTOMY WITH RIGHT SALPINGECTOMY;  Surgeon: Malachy Mood, MD;  Location: [facility] ORS;  Service: Gynecology;  Laterality: Right;  . TUBAL LIGATION  1997    Medical History: Past Medical History:  Diagnosis Date  . Anemia    AS A CHILD  . Diabetes mellitus without complication ([place])   . Fibroids   . Hemorrhoids   . Seasonal allergies   . Sleep apnea    NO CPAP    Family History: Family History  Problem Relation Age of Onset  . Breast cancer Neg Hx     Social History   Socioeconomic History  . Marital status: Married    Spouse name: Not on file  . Number of children: Not on file  . Years of education: Not on file  . Highest education level: Not on file  Occupational History  . Not on file  Social Needs  . Financial resource strain: Not on file  . Food insecurity    Worry: Not on file    Inability: Not on file  . Transportation needs    Medical: Not on file    Non-medical: Not on file  Tobacco Use  . Smoking status: Never Smoker  . Smokeless tobacco: Never Used  Substance  and Sexual Activity  . Alcohol use: No  . Drug use: No  . Sexual activity: Yes    Birth control/protection: None  Lifestyle  . Physical activity    Days per week: Not on file    Minutes per session: Not on file  . Stress: Not on file  Relationships  . Social Herbalist on phone: Not on file    Gets together: Not on file    Attends religious service: Not on file    Active member of club or organization: Not on file    Attends meetings of clubs or organizations: Not on file    Relationship status: Not on file  . Intimate partner violence    Fear of current or ex partner: Not on file    Emotionally abused: Not on file    Physically abused: Not on file    Forced sexual activity: Not on  file  Other Topics Concern  . Not on file  Social History Narrative  . Not on file      Review of Systems  Constitutional: Negative for chills, fatigue and unexpected weight change.  HENT: Negative for congestion, rhinorrhea, sneezing and sore throat.   Eyes: Negative for photophobia, pain and redness.  Respiratory: Negative for cough, chest tightness and shortness of breath.   Cardiovascular: Negative for chest pain and palpitations.  Gastrointestinal: Negative for abdominal pain, constipation, diarrhea, nausea and vomiting.  Endocrine: Negative.   Genitourinary: Negative for dysuria and frequency.  Musculoskeletal: Negative for arthralgias, back pain, joint swelling and neck pain.  Skin: Negative for rash.  Allergic/Immunologic: Negative.   Neurological: Negative for tremors and numbness.  Hematological: Negative for adenopathy. Does not bruise/bleed easily.  Psychiatric/Behavioral: Negative for behavioral problems and sleep disturbance. The patient is not nervous/anxious.     Vital Signs: BP 138/87   Pulse 87   Resp 16   Ht 5\' 4"  (1.626 m)   Wt 171 lb (77.6 kg)   LMP 12/28/2016   SpO2 98%   BMI 29.35 kg/m    Physical Exam Vitals signs and nursing note reviewed.  Constitutional:      General: She is not in acute distress.    Appearance: She is well-developed. She is not diaphoretic.  HENT:     Head: Normocephalic and atraumatic.     Mouth/Throat:     Pharynx: No oropharyngeal exudate.  Eyes:     Pupils: Pupils are equal, round, and reactive to light.  Neck:     Musculoskeletal: Normal range of motion and neck supple.     Thyroid: No thyromegaly.     Vascular: No JVD.     Trachea: No tracheal deviation.  Cardiovascular:     Rate and Rhythm: Normal rate and regular rhythm.     Heart sounds: Normal heart sounds. No murmur. No friction rub. No gallop.   Pulmonary:     Effort: Pulmonary effort is normal. No respiratory distress.     Breath sounds: Normal breath  sounds. No wheezing or rales.  Chest:     Chest wall: No tenderness.  Abdominal:     Palpations: Abdomen is soft.     Tenderness: There is no abdominal tenderness. There is no guarding.  Musculoskeletal: Normal range of motion.  Lymphadenopathy:     Cervical: No cervical adenopathy.  Skin:    General: Skin is warm and dry.  Neurological:     Mental Status: She is alert and oriented to person, place, and time.  Cranial Nerves: No cranial nerve deficit.  Psychiatric:        Behavior: Behavior normal.        Thought Content: Thought content normal.        Judgment: Judgment normal.     Assessment/Plan: 1. Uncontrolled type 2 diabetes mellitus with hyperglycemia (HCC) Stable, continue current management.   2. Encounter for completion of form with patient Form filled out for patient, she should work remotely due to her comorbidities.   General Counseling: Sareen verbalizes understanding of the findings of todays visit and agrees with plan of treatment. I have discussed any further diagnostic evaluation that may be needed or ordered today. We also reviewed her medications today. she has been encouraged to call the office with any questions or concerns that should arise related to todays visit.    No orders of the defined types were placed in this encounter.   No orders of the defined types were placed in this encounter.   Time spent: 20 Minutes   This patient was seen by Orson Gear AGNP-C in Collaboration with Dr Lavera Guise as a part of collaborative care agreement     Kendell Bane AGNP-C Internal medicine

## 2018-09-25 ENCOUNTER — Ambulatory Visit: Payer: BC Managed Care – PPO | Admitting: Nurse Practitioner

## 2018-09-25 ENCOUNTER — Other Ambulatory Visit: Payer: Self-pay

## 2018-09-25 ENCOUNTER — Encounter: Payer: Self-pay | Admitting: Nurse Practitioner

## 2018-09-25 ENCOUNTER — Other Ambulatory Visit: Payer: Self-pay | Admitting: Internal Medicine

## 2018-09-25 VITALS — BP 124/84 | HR 67 | Resp 16 | Ht 64.0 in | Wt 174.0 lb

## 2018-09-25 DIAGNOSIS — B373 Candidiasis of vulva and vagina: Secondary | ICD-10-CM

## 2018-09-25 DIAGNOSIS — Z0001 Encounter for general adult medical examination with abnormal findings: Secondary | ICD-10-CM

## 2018-09-25 DIAGNOSIS — B3731 Acute candidiasis of vulva and vagina: Secondary | ICD-10-CM

## 2018-09-25 DIAGNOSIS — R300 Dysuria: Secondary | ICD-10-CM

## 2018-09-25 DIAGNOSIS — E1165 Type 2 diabetes mellitus with hyperglycemia: Secondary | ICD-10-CM

## 2018-09-25 DIAGNOSIS — Z1231 Encounter for screening mammogram for malignant neoplasm of breast: Secondary | ICD-10-CM

## 2018-09-25 DIAGNOSIS — N959 Unspecified menopausal and perimenopausal disorder: Secondary | ICD-10-CM | POA: Diagnosis not present

## 2018-09-25 LAB — POCT GLYCOSYLATED HEMOGLOBIN (HGB A1C): Hemoglobin A1C: 9.6 % — AB (ref 4.0–5.6)

## 2018-09-25 MED ORDER — FLUCONAZOLE 150 MG PO TABS: ORAL_TABLET | ORAL | 0 refills | Status: DC

## 2018-09-25 NOTE — Progress Notes (Signed)
[facility] [place], [place] [address]  Internal MEDICINE  Office Visit Note  Patient Name: Mandy Shea  [id number]  [id number]  Date of Service: 10/07/2018   Pt is here for routine health maintenance examination  Chief Complaint  Patient presents with  . Annual Exam  . Anemia  . Diabetes  . Sleep Apnea  . Quality Metric Gaps    diabetic eye exam     The patient is here for health maintenance exam. Blood sugars are very elevated today. She states that she really has not been taking victoza. During a good week, she will take it once or twice. Blood sugars have always been well managed. Today, HgbA1c is 9.6. this is up significantly from 6.4 at last check. She states that she is getting a lot of yeast infections. Was treated recently, got a little better, however she continues to have vaginal itching with white, thick discharge which does not have odor.     Current Medication: Outpatient Encounter Medications as of 09/25/2018  Medication Sig  . BD PEN NEEDLE NANO U/F 32G X 4 MM MISC USE AS DIRECTED WITH VICTOZA PEN  . diclofenac sodium (VOLTAREN) 1 % GEL Apply topically as needed.  . liraglutide (VICTOZA) 18 MG/3ML SOPN INJECT 1.2MG  SUBCUTANEOUSLY EVERY DAY  . loratadine (CLARITIN) 10 MG tablet Take 1 tablet (10 mg total) by mouth daily.  . montelukast (SINGULAIR) 10 MG tablet Take 1 tablet (10 mg total) by mouth daily.  . [DISCONTINUED] metFORMIN (GLUCOPHAGE-XR) 500 MG 24 hr tablet TAKE 1 TABLET BY MOUTH EVERY DAY FOR DIABETES  . fluconazole (DIFLUCAN) 150 MG tablet Take 1 tablet po once. May repeat dose in 3 days as needed for persistent symptoms.  . [DISCONTINUED] b complex vitamins tablet Take 1 tablet by mouth daily.  . [DISCONTINUED] diclofenac sodium (VOLTAREN) 1 % GEL Apply 4 g topically 4 (four) times daily. (Patient not taking: Reported on 09/25/2018)  . [DISCONTINUED] valACYclovir (VALTREX) 1000 MG tablet Take 1 tablet po BID for 5 days,  then take 1 tablet po QD to prevent fever blisters. (Patient not taking: Reported on 09/25/2018)   No facility-administered encounter medications on file as of 09/25/2018.     Surgical History: Past Surgical History:  Procedure Laterality Date  . CHOLECYSTECTOMY  09-26-13  . CYSTOSCOPY N/A 01/12/2017   Procedure: CYSTOSCOPY;  Surgeon: Malachy Mood, MD;  Location: [facility];  Service: Gynecology;  Laterality: N/A;  . HYSTEROSCOPY W/D&C  11/24/2016   Procedure: DILATATION AND CURETTAGE /HYSTEROSCOPY;  Surgeon: Malachy Mood, MD;  Location: [facility] OR[facility];  Service: Gynecology;;  . KNEE SURGERY Right   . LEEP  2010   [facility]  . TONSILLECTOMY  2014  . TOTAL LAPAROSCOPIC HYSTERECTOMY WITH SALPINGECTOMY Right 01/12/2017   Procedure: TOTAL LAPAROSCOPIC HYSTERECTOMY WITH RIGHT SALPINGECTOMY;  Surgeon: Malachy Mood, MD;  Location: [facility] ORS;  Service: Gynecology;  Laterality: Right;  . TUBAL LIGATION  1997    Medical History: Past Medical History:  Diagnosis Date  . Anemia    AS A CHILD  . Diabetes mellitus without complication ([place])   . Fibroids   . Hemorrhoids   . Seasonal allergies   . Sleep apnea    NO CPAP    Family History: Family History  Problem Relation Age of Onset  . Breast cancer Neg Hx       Review of Systems  Constitutional: Negative for activity change, chills, fatigue and unexpected weight change.  HENT: Negative for congestion, postnasal drip,  rhinorrhea, sneezing and sore throat.   Respiratory: Negative for cough, chest tightness, shortness of breath and wheezing.   Cardiovascular: Negative for chest pain and palpitations.  Gastrointestinal: Negative for abdominal pain, constipation, diarrhea, nausea and vomiting.  Endocrine: Negative for cold intolerance, heat intolerance, polydipsia and polyuria.       Has been not taking victoza most days. On good weeks she is taking this once or twice.   Genitourinary: Positive for dysuria, frequency and vaginal  discharge.  Musculoskeletal: Negative for back pain, joint swelling and neck pain.  Skin: Negative for rash.  Allergic/Immunologic: Negative for environmental allergies.  Neurological: Negative for dizziness, tremors, numbness and headaches.  Hematological: Negative for adenopathy. Does not bruise/bleed easily.  Psychiatric/Behavioral: Negative for behavioral problems (Depression), dysphoric mood, sleep disturbance and suicidal ideas. The patient is not nervous/anxious.      Today's Vitals   09/25/18 1510  BP: 124/84  Pulse: 67  Resp: 16  SpO2: 97%  Weight: 174 lb (78.9 kg)  Height: 5\' 4"  (1.626 m)   Body mass index is 29.87 kg/m.  Physical Exam Vitals signs and nursing note reviewed.  Constitutional:      General: She is not in acute distress.    Appearance: Normal appearance. She is well-developed. She is not diaphoretic.  HENT:     Head: Normocephalic and atraumatic.  Eyes:     General: No scleral icterus.       Right eye: No discharge.        Left eye: No discharge.     Conjunctiva/sclera: Conjunctivae normal.     Pupils: Pupils are equal, round, and reactive to light.  Neck:     Musculoskeletal: Normal range of motion and neck supple.     Thyroid: No thyromegaly.  Cardiovascular:     Rate and Rhythm: Normal rate and regular rhythm.     Pulses: Normal pulses.          Dorsalis pedis pulses are 2+ on the right side and 2+ on the left side.       Posterior tibial pulses are 2+ on the right side and 2+ on the left side.     Heart sounds: Normal heart sounds. No murmur. No friction rub. No gallop.   Pulmonary:     Effort: Pulmonary effort is normal. No respiratory distress.     Breath sounds: Normal breath sounds. No wheezing or rales.  Chest:     Chest wall: No tenderness.     Breasts:        Right: Normal. No swelling, bleeding, inverted nipple, mass, nipple discharge, skin change or tenderness.        Left: Normal. No swelling, bleeding, inverted nipple, mass,  nipple discharge, skin change or tenderness.  Abdominal:     General: Bowel sounds are normal. There is no distension.     Palpations: Abdomen is soft.     Tenderness: There is no abdominal tenderness. There is no guarding.  Genitourinary:    Comments: Urine positive for trace blood.  Musculoskeletal: Normal range of motion.        General: No tenderness or deformity.     Right foot: Normal range of motion. No deformity or bunion.     Left foot: Normal range of motion. No deformity or bunion.  Feet:     Right foot:     Protective Sensation: 10 sites tested. 10 sites sensed.     Skin integrity: Skin integrity normal.     Toenail Condition:  Right toenails are normal.     Left foot:     Protective Sensation: 10 sites tested. 10 sites sensed.     Skin integrity: Skin integrity normal.     Toenail Condition: Left toenails are normal.  Lymphadenopathy:     Cervical: No cervical adenopathy.  Skin:    General: Skin is warm and dry.     Coloration: Skin is not pale.     Findings: No erythema or rash.  Neurological:     Mental Status: She is alert and oriented to person, place, and time.  Psychiatric:        Behavior: Behavior normal.        Thought Content: Thought content normal.        Judgment: Judgment normal.    LABS: Recent Results (from the past 2160 hour(s))  Novel Coronavirus, NAA ([facility])     Status: None   Collection Time: 08/16/18  1:12 PM  Result Value Ref Range   SARS-CoV-2, NAA Not Detected Not Detected    Comment: Testing was performed using the cobas(R) SARS-CoV-2 test. This test was developed and its performance characteristics determined by [employer]. This test has not been FDA cleared or approved. This test has been authorized by FDA under an Emergency Use Authorization (EUA). This test is only authorized for the duration of time the declaration that circumstances exist justifying the authorization of the emergency use of in vitro diagnostic tests  for detection of SARS-CoV-2 virus and/or diagnosis of COVID-19 infection under section 564(b)(1) of the Act, 21 U.S.C. 449QPR-9(F)(6), unless the authorization is terminated or revoked sooner. When diagnostic testing is negative, the possibility of a false negative result should be considered in the context of a patient's recent exposures and the presence of clinical signs and symptoms consistent with COVID-19. An individual without symptoms of COVID-19 and who is not shedding SARS-CoV-2 virus would expect to have a negati ve (not detected) result in this assay.   Urinalysis, Routine w reflex microscopic     Status: Abnormal   Collection Time: 09/25/18  3:10 AM  Result Value Ref Range   Specific Gravity, UA 1.009 1.005 - 1.030   pH, UA 7.0 5.0 - 7.5   Color, UA Yellow Yellow   Appearance Ur Clear Clear   Leukocytes,UA Trace (A) Negative   Protein,UA Negative Negative/Trace   Glucose, UA 2+ (A) Negative   Ketones, UA Negative Negative   RBC, UA Negative Negative   Bilirubin, UA Negative Negative   Urobilinogen, Ur 0.2 0.2 - 1.0 mg/dL   Nitrite, UA Negative Negative   Microscopic Examination See below:     Comment: Microscopic was indicated and was performed.  Microscopic Examination     Status: None   Collection Time: 09/25/18  3:10 AM   URINE  Result Value Ref Range   WBC, UA 0-5 0 - 5 /hpf   RBC 0-2 0 - 2 /hpf   Epithelial Cells (non renal) 0-10 0 - 10 /hpf   Casts None seen None seen /lpf   Mucus, UA Present Not Estab.   Bacteria, UA None seen None seen/Few  POCT HgB A1C     Status: Abnormal   Collection Time: 09/25/18  4:04 PM  Result Value Ref Range   Hemoglobin A1C 9.6 (A) 4.0 - 5.6 %   HbA1c POC (<> result, manual entry)     HbA1c, POC (prediabetic range)     HbA1c, POC (controlled diabetic range)    CBC  Status: Abnormal   Collection Time: 09/27/18  8:56 AM  Result Value Ref Range   WBC 4.6 3.4 - 10.8 x10E3/uL   RBC 4.32 3.77 - 5.28 x10E6/uL   Hemoglobin  13.4 11.1 - 15.9 g/dL   Hematocrit 39.0 34.0 - 46.6 %   MCV 90 79 - 97 fL   MCH 31.0 26.6 - 33.0 pg   MCHC 34.4 31.5 - 35.7 g/dL   RDW 11.0 (L) 11.7 - 15.4 %   Platelets 290 150 - 450 [id number]/PN  Basic metabolic panel     Status: Abnormal   Collection Time: 09/27/18  8:56 AM  Result Value Ref Range   Glucose 132 (H) 65 - 99 mg/dL   BUN 9 6 - 24 mg/dL   Creatinine, Ser 0.77 0.57 - 1.00 mg/dL   GFR calc non Af Amer 90 >59 mL/min/1.73   GFR calc Af Amer 103 >59 mL/min/1.73   BUN/Creatinine Ratio 12 9 - 23   Sodium 139 134 - 144 mmol/L   Potassium 4.1 3.5 - 5.2 mmol/L   Chloride 99 96 - 106 mmol/L   CO2 23 20 - 29 mmol/L   Calcium 9.7 8.7 - 10.2 mg/dL  Lipid Panel w/o Chol/HDL Ratio     Status: Abnormal   Collection Time: 09/27/18  8:56 AM  Result Value Ref Range   Cholesterol, Total 175 100 - 199 mg/dL   Triglycerides 132 0 - 149 mg/dL   HDL 49 >39 mg/dL   VLDL Cholesterol Cal 26 5 - 40 mg/dL   LDL Calculated 100 (H) 0 - 99 mg/dL  FSH/LH     Status: None   Collection Time: 09/27/18  8:56 AM  Result Value Ref Range   LH 29.9 mIU/mL    Comment:                     Adult Female:                       Follicular phase      2.4 -  12.6                       Ovulation phase      14.0 -  95.6                       Luteal phase          1.0 -  11.4                       Postmenopausal        7.7 -  58.5    FSH 10.2 mIU/mL    Comment:                     Adult Female:                       Follicular phase      3.5 -  12.5                       Ovulation phase       4.7 -  21.5                       Luteal phase          1.7 -   7.7  Postmenopausal       25.8 - 134.8   T4, free     Status: None   Collection Time: 09/27/18  8:56 AM  Result Value Ref Range   Free T4 1.14 0.82 - 1.77 ng/dL  TSH     Status: None   Collection Time: 09/27/18  8:56 AM  Result Value Ref Range   TSH 4.100 0.450 - 4.500 uIU/mL  Estradiol     Status: None   Collection Time: 09/27/18   8:56 AM  Result Value Ref Range   Estradiol 383.6 pg/mL    Comment:                     Adult Female:                       Follicular phase   29.5 -   166.0                       Ovulation phase    85.8 -   498.0                       Luteal phase       43.8 -   211.0                       Postmenopausal     <6.0 -    54.7                     Pregnancy                       1st trimester     215.0 - >4300.0 Roche ECLIA methodology   VITAMIN D 25 Hydroxy (Vit-D Deficiency, Fractures)     Status: Abnormal   Collection Time: 09/27/18  8:56 AM  Result Value Ref Range   Vit D, 25-Hydroxy 28.0 (L) 30.0 - 100.0 ng/mL    Comment: Vitamin D deficiency has been defined by the [place] and an Endocrine Society practice guideline as a level of serum 25-OH vitamin D less than 20 ng/mL (1,2). The Endocrine Society went on to further define vitamin D insufficiency as a level between 21 and 29 ng/mL (2). 1. IOM (Institute of Medicine). 2010. Dietary reference    intakes for calcium and D. [place]: The    [employer]. 2. Holick MF, Binkley [place], Bischoff-Ferrari HA, et al.    Evaluation, treatment, and prevention of vitamin D    deficiency: an Endocrine Society clinical practice    guideline. JCEM. 2011 Jul; 96(7):1911-30.    Assessment/Plan: 1. Encounter for general adult medical examination with abnormal findings Annual health maintenance exam today,  2. Type 2 diabetes mellitus with hyperglycemia, unspecified whether long term insulin use (HCC) - POCT HgB A1C 9.6 today. Advised patient to begin taking victoza again. Start with 0.6mg  daily and gradually titrate to 1.8mg  daily as tolerated. Monitor blood sugars daily. Limit cabs and sugar in the diet.   3. Vaginal candidiasis Diflucan 150mg  tablets. Take one tablet today. May repeat the dose in 3 days for persistent symptoms.  - fluconazole (DIFLUCAN) 150 MG tablet; Take 1 tablet po once. May repeat dose in 3  days as needed for persistent symptoms.  Dispense: 3 tablet; Refill: 0  4. Unspecified menopausal and perimenopausal disorder Check reproductive hormones with routine, fasting labs.  5. Dysuria - Urinalysis, Routine w reflex microscopic  General Counseling: Tijah verbalizes understanding of the findings of todays visit and agrees with plan of treatment. I have discussed any further diagnostic evaluation that may be needed or ordered today. We also reviewed her medications today. she has been encouraged to call the office with any questions or concerns that should arise related to todays visit.    Counseling:  Diabetes Counseling:  1. Addition of ACE inh/ ARB'S for nephroprotection. Microalbumin is updated  2. Diabetic foot care, prevention of complications. Podiatry consult 3. Exercise and lose weight.  4. Diabetic eye examination, Diabetic eye exam is updated  5. Monitor blood sugar closlely. nutrition counseling.  6. Sign and symptoms of hypoglycemia including shaking sweating,confusion and headaches.  This patient was seen by Leretha Pol FNP Collaboration with Dr Lavera Guise as a part of collaborative care agreement  Orders Placed This Encounter  Procedures  . Microscopic Examination  . Urinalysis, Routine w reflex microscopic  . POCT HgB A1C    Meds ordered this encounter  Medications  . fluconazole (DIFLUCAN) 150 MG tablet    Sig: Take 1 tablet po once. May repeat dose in 3 days as needed for persistent symptoms.    Dispense:  3 tablet    Refill:  0    Order Specific Question:   Supervising Provider    Answer:   Lavera Guise [[id number]]    Time spent: [place], MD  Internal Medicine

## 2018-09-26 ENCOUNTER — Other Ambulatory Visit: Payer: Self-pay

## 2018-09-26 LAB — URINALYSIS, ROUTINE W REFLEX MICROSCOPIC
Bilirubin, UA: NEGATIVE
Ketones, UA: NEGATIVE
Nitrite, UA: NEGATIVE
Protein,UA: NEGATIVE
RBC, UA: NEGATIVE
Specific Gravity, UA: 1.009 (ref 1.005–1.030)
Urobilinogen, Ur: 0.2 mg/dL (ref 0.2–1.0)
pH, UA: 7 (ref 5.0–7.5)

## 2018-09-26 LAB — MICROSCOPIC EXAMINATION
Bacteria, UA: NONE SEEN
Casts: NONE SEEN /lpf

## 2018-09-26 MED ORDER — METFORMIN HCL ER 500 MG PO TB24: ORAL_TABLET | ORAL | 3 refills | Status: DC

## 2018-09-27 ENCOUNTER — Other Ambulatory Visit: Payer: Self-pay | Admitting: Nurse Practitioner

## 2018-09-28 LAB — BASIC METABOLIC PANEL
BUN/Creatinine Ratio: 12 (ref 9–23)
BUN: 9 mg/dL (ref 6–24)
CO2: 23 mmol/L (ref 20–29)
Calcium: 9.7 mg/dL (ref 8.7–10.2)
Chloride: 99 mmol/L (ref 96–106)
Creatinine, Ser: 0.77 mg/dL (ref 0.57–1.00)
GFR calc Af Amer: 103 mL/min/{1.73_m2} (ref >59)
GFR calc non Af Amer: 90 mL/min/{1.73_m2} (ref >59)
Glucose: 132 mg/dL — ABNORMAL HIGH (ref 65–99)
Potassium: 4.1 mmol/L (ref 3.5–5.2)
Sodium: 139 mmol/L (ref 134–144)

## 2018-09-28 LAB — CBC
Hematocrit: 39 % (ref 34.0–46.6)
Hemoglobin: 13.4 g/dL (ref 11.1–15.9)
MCH: 31 pg (ref 26.6–33.0)
MCHC: 34.4 g/dL (ref 31.5–35.7)
MCV: 90 fL (ref 79–97)
Platelets: 290 10*3/uL (ref 150–450)
RBC: 4.32 x10E6/uL (ref 3.77–5.28)
RDW: 11 % — ABNORMAL LOW (ref 11.7–15.4)
WBC: 4.6 10*3/uL (ref 3.4–10.8)

## 2018-09-28 LAB — LIPID PANEL W/O CHOL/HDL RATIO
Cholesterol, Total: 175 mg/dL (ref 100–199)
HDL: 49 mg/dL (ref >39)
LDL Calculated: 100 mg/dL — ABNORMAL HIGH (ref 0–99)
Triglycerides: 132 mg/dL (ref 0–149)
VLDL Cholesterol Cal: 26 mg/dL (ref 5–40)

## 2018-09-28 LAB — FSH/LH
FSH: 10.2 m[IU]/mL
LH: 29.9 m[IU]/mL

## 2018-09-28 LAB — TSH: TSH: 4.1 u[IU]/mL (ref 0.450–4.500)

## 2018-09-28 LAB — T4, FREE: Free T4: 1.14 ng/dL (ref 0.82–1.77)

## 2018-09-28 LAB — VITAMIN D 25 HYDROXY (VIT D DEFICIENCY, FRACTURES): Vit D, 25-Hydroxy: 28 ng/mL — ABNORMAL LOW (ref 30.0–100.0)

## 2018-09-28 LAB — ESTRADIOL: Estradiol: 383.6 pg/mL

## 2018-10-02 ENCOUNTER — Encounter: Payer: Self-pay | Admitting: Adult Health

## 2018-10-07 DIAGNOSIS — E1165 Type 2 diabetes mellitus with hyperglycemia: Secondary | ICD-10-CM | POA: Insufficient documentation

## 2018-10-07 DIAGNOSIS — N959 Unspecified menopausal and perimenopausal disorder: Secondary | ICD-10-CM | POA: Insufficient documentation

## 2018-10-10 ENCOUNTER — Telehealth: Payer: Self-pay | Admitting: Internal Medicine

## 2018-10-10 NOTE — Telephone Encounter (Signed)
Reviewed labs with pt. 

## 2018-10-10 NOTE — Progress Notes (Signed)
Please let the patient know I reviewed labs. Look good. Reproductive hormones still circulating. Not fully menopausal at this time. Thanks.

## 2018-10-10 NOTE — Telephone Encounter (Signed)
-----   Message from Ronnell Freshwater, NP sent at 10/10/2018  2:38 PM EDT ----- Please let the patient know I reviewed labs. Look good. Reproductive hormones still circulating. Not fully menopausal at this time. Thanks.

## 2018-11-01 ENCOUNTER — Ambulatory Visit
Admission: RE | Admit: 2018-11-01 | Discharge: 2018-11-01 | Disposition: A | Discharge status: Home / Self Care | Payer: BC Managed Care – PPO | Source: Ambulatory Visit | Attending: Internal Medicine | Admitting: Internal Medicine

## 2018-11-01 DIAGNOSIS — Z1231 Encounter for screening mammogram for malignant neoplasm of breast: Secondary | ICD-10-CM

## 2018-11-14 ENCOUNTER — Other Ambulatory Visit: Payer: Self-pay | Admitting: Nurse Practitioner

## 2018-11-14 ENCOUNTER — Telehealth: Payer: Self-pay

## 2018-11-14 ENCOUNTER — Other Ambulatory Visit: Payer: Self-pay

## 2018-11-14 DIAGNOSIS — E1165 Type 2 diabetes mellitus with hyperglycemia: Secondary | ICD-10-CM

## 2018-11-14 MED ORDER — OZEMPIC (0.25 OR 0.5 MG/DOSE) 2 MG/1.5ML ~~LOC~~ SOPN: 0.5000 mg | PEN_INJECTOR | SUBCUTANEOUS | 3 refills | Status: DC

## 2018-11-14 MED ORDER — VALACYCLOVIR HCL 1 G PO TABS: ORAL_TABLET | ORAL | 0 refills | Status: DC

## 2018-11-14 NOTE — Progress Notes (Signed)
D/c victoza due to reported negative side effects. Start ozempic 0.25mg  weekly until next visit in 11/2018. Can provide sample.

## 2018-11-14 NOTE — Telephone Encounter (Signed)
D/c victoza due to reported negative side effects. Start ozempic 0.25mg  weekly until next visit in 11/2018. Can provide sample.  If we don't have any, let me know and I will send the prescription to her phramacy. Thanks.

## 2018-11-14 NOTE — Telephone Encounter (Signed)
Pt advised that stopped victoza and we put on Ozempic 0.25mg  once a week and we have sample so she can come pick it up

## 2018-11-14 NOTE — Telephone Encounter (Signed)
As per heather ok to sen valtrex pt is on accidentally D/c

## 2018-11-16 ENCOUNTER — Ambulatory Visit: Payer: BC Managed Care – PPO | Admitting: Nurse Practitioner

## 2018-11-27 ENCOUNTER — Other Ambulatory Visit: Payer: Self-pay

## 2018-11-27 MED ORDER — MONTELUKAST SODIUM 10 MG PO TABS: 10.0000 mg | ORAL_TABLET | Freq: Every day | ORAL | 5 refills | Status: DC

## 2018-11-29 ENCOUNTER — Ambulatory Visit: Payer: BC Managed Care – PPO | Admitting: Nurse Practitioner

## 2018-11-29 ENCOUNTER — Encounter: Payer: Self-pay | Admitting: Nurse Practitioner

## 2018-11-29 ENCOUNTER — Other Ambulatory Visit: Payer: Self-pay

## 2018-11-29 VITALS — BP 120/84 | HR 78 | Temp 97.3°F | Resp 16 | Ht 64.0 in | Wt 175.2 lb

## 2018-11-29 DIAGNOSIS — L209 Atopic dermatitis, unspecified: Secondary | ICD-10-CM

## 2018-11-29 DIAGNOSIS — E1165 Type 2 diabetes mellitus with hyperglycemia: Secondary | ICD-10-CM | POA: Diagnosis not present

## 2018-11-29 DIAGNOSIS — Z23 Encounter for immunization: Secondary | ICD-10-CM | POA: Diagnosis not present

## 2018-11-29 NOTE — Patient Instructions (Signed)

## 2018-11-29 NOTE — Progress Notes (Signed)
[facility] [place], [place] [address]  Internal MEDICINE  Office Visit Note  Patient Name: Mandy Shea  [id number]  [id number]  Date of Service: 12/09/2018   Pt is here for a sick visit.  Chief Complaint  Patient presents with  . Bleeding/Bruising    unexplained bruising on both legs     Patient is here for acute visit. She is concerned about bruising she has noted on her right lower leg. She has another area of concern on left knee. Both areas started where she initially noted insect bites. She feels like there are scratch marks present around the areas of bruising. She thinks she has scratched the bites so much that they have bruised. There is no pain or tenderness associated with these areas. No other areas of concern are present.        Current Medication:  Outpatient Encounter Medications as of 11/29/2018  Medication Sig  . BD PEN NEEDLE NANO U/F 32G X 4 MM MISC USE AS DIRECTED WITH VICTOZA PEN  . diclofenac sodium (VOLTAREN) 1 % GEL Apply topically as needed.  . loratadine (CLARITIN) 10 MG tablet Take 1 tablet (10 mg total) by mouth daily.  . metFORMIN (GLUCOPHAGE-XR) 500 MG 24 hr tablet TAKE 1 TABLET BY MOUTH EVERY DAY FOR DIABETES  . montelukast (SINGULAIR) 10 MG tablet Take 1 tablet (10 mg total) by mouth daily.  . Semaglutide,0.25 or 0.5MG /DOS, (OZEMPIC, 0.25 OR 0.5 MG/DOSE,) 2 MG/1.5ML SOPN Inject 0.5 mg into the skin once a week.  . valACYclovir (VALTREX) 1000 MG tablet Take 1  Tab by po twice a day for 5 days and take 1 tab by po daily  to prevent fever blister  . [DISCONTINUED] fluconazole (DIFLUCAN) 150 MG tablet Take 1 tablet po once. May repeat dose in 3 days as needed for persistent symptoms. (Patient not taking: Reported on 11/29/2018)   No facility-administered encounter medications on file as of 11/29/2018.       Medical History: Past Medical History:  Diagnosis Date  . Anemia    AS A CHILD  . Diabetes mellitus  without complication ([place])   . Fibroids   . Hemorrhoids   . Seasonal allergies   . Sleep apnea    NO CPAP     Today's Vitals   11/29/18 1409  BP: 120/84  Pulse: 78  Resp: 16  Temp: (!) 97.3 F (36.3 C)  SpO2: 97%  Weight: 175 lb 3.2 oz (79.5 kg)  Height: 5\' 4"  (1.626 m)   Body mass index is 30.07 kg/m.  Review of Systems  Constitutional: Negative for activity change, chills, fatigue and unexpected weight change.  HENT: Negative for congestion, postnasal drip, rhinorrhea, sneezing and sore throat.   Respiratory: Negative for cough, chest tightness and shortness of breath.   Cardiovascular: Negative for chest pain and palpitations.  Gastrointestinal: Negative for abdominal pain, constipation, diarrhea, nausea and vomiting.  Genitourinary: Negative for dysuria and frequency.  Musculoskeletal: Negative for arthralgias, back pain, joint swelling and neck pain.  Skin: Negative for rash.       Dry and itchy skin. Areas of bruising in fingernail lines around the most irritated areas of skin.   Neurological: Negative.  Negative for tremors and numbness.  Hematological: Negative for adenopathy. Does not bruise/bleed easily.  Psychiatric/Behavioral: Negative for behavioral problems (Depression), sleep disturbance and suicidal ideas. The patient is not nervous/anxious.     Physical Exam Vitals signs and nursing note reviewed.  Constitutional:  General: She is not in acute distress.    Appearance: Normal appearance. She is well-developed. She is not diaphoretic.  HENT:     Head: Normocephalic and atraumatic.     Mouth/Throat:     Pharynx: No oropharyngeal exudate.  Eyes:     Pupils: Pupils are equal, round, and reactive to light.  Neck:     Musculoskeletal: Normal range of motion and neck supple.     Thyroid: No thyromegaly.     Vascular: No JVD.     Trachea: No tracheal deviation.  Cardiovascular:     Rate and Rhythm: Normal rate and regular rhythm.     Heart sounds:  Normal heart sounds. No murmur. No friction rub. No gallop.   Pulmonary:     Effort: Pulmonary effort is normal. No respiratory distress.     Breath sounds: Normal breath sounds. No wheezing or rales.  Chest:     Chest wall: No tenderness.  Abdominal:     Palpations: Abdomen is soft.  Musculoskeletal: Normal range of motion.  Lymphadenopathy:     Cervical: No cervical adenopathy.  Skin:    General: Skin is warm and dry.     Findings: Bruising and erythema present.     Comments: Dry skin in patches on the legs and abdomen. Redness and inflammation in areas of dry skin. No lesions or wounds present. No evidence in infection present. Skin intact with no drainage noted.   Neurological:     Mental Status: She is alert and oriented to person, place, and time.     Cranial Nerves: No cranial nerve deficit.  Psychiatric:        Behavior: Behavior normal.        Thought Content: Thought content normal.        Judgment: Judgment normal.   Assessment/Plan: 1. Atopic dermatitis, unspecified type Recommend use of OTC hydrocortisone cream as needed for itching and irritation.   2. Type 2 diabetes mellitus with hyperglycemia, without long-term current use of insulin ([place]) Continue all diabetic medication as prescribed   3. Flu vaccine need - Flu Vaccine MDCK QUAD PF  General Counseling: Mandy Shea verbalizes understanding of the findings of todays visit and agrees with plan of treatment. I have discussed any further diagnostic evaluation that may be needed or ordered today. We also reviewed her medications today. she has been encouraged to call the office with any questions or concerns that should arise related to todays visit.    Counseling:  This patient was seen by Leretha Pol FNP Collaboration with Dr Lavera Guise as a part of collaborative care agreement   Orders Placed This Encounter  Procedures  . Flu Vaccine MDCK QUAD PF     Time spent: 25 Minutes

## 2018-11-30 ENCOUNTER — Ambulatory Visit: Payer: BC Managed Care – PPO

## 2018-12-09 DIAGNOSIS — Z23 Encounter for immunization: Secondary | ICD-10-CM | POA: Insufficient documentation

## 2018-12-09 DIAGNOSIS — L209 Atopic dermatitis, unspecified: Secondary | ICD-10-CM | POA: Insufficient documentation

## 2018-12-24 ENCOUNTER — Telehealth: Payer: Self-pay | Admitting: Nurse Practitioner

## 2018-12-24 ENCOUNTER — Other Ambulatory Visit: Payer: Self-pay | Admitting: Nurse Practitioner

## 2018-12-24 DIAGNOSIS — Z20828 Contact with and (suspected) exposure to other viral communicable diseases: Secondary | ICD-10-CM

## 2018-12-24 DIAGNOSIS — Z20822 Contact with and (suspected) exposure to covid-19: Secondary | ICD-10-CM

## 2018-12-24 NOTE — Telephone Encounter (Signed)
Patient called and states that daughter tested positive for covid , has upcoming appt on Thursday for 3 month follow up for a1c and ozempic , pt will do virtual visit with you but would like to be tested for covid.

## 2018-12-24 NOTE — Telephone Encounter (Signed)
Order for COVID 19 testing in [place]. She can go to [facility], weekdays from 8am to 4pm. I think they may close a little earlier on fridays.

## 2018-12-24 NOTE — Progress Notes (Signed)
Order for COVID 19 testing in [place]. She can go to [facility], weekdays from 8am to 4pm.

## 2018-12-25 ENCOUNTER — Other Ambulatory Visit: Payer: Self-pay | Admitting: *Deleted

## 2018-12-25 DIAGNOSIS — Z20822 Contact with and (suspected) exposure to covid-19: Secondary | ICD-10-CM

## 2018-12-27 ENCOUNTER — Ambulatory Visit: Payer: BC Managed Care – PPO | Admitting: Nurse Practitioner

## 2018-12-27 ENCOUNTER — Other Ambulatory Visit: Payer: Self-pay

## 2018-12-27 ENCOUNTER — Encounter: Payer: Self-pay | Admitting: Nurse Practitioner

## 2018-12-27 VITALS — Ht 64.0 in | Wt 171.0 lb

## 2018-12-27 DIAGNOSIS — B001 Herpesviral vesicular dermatitis: Secondary | ICD-10-CM | POA: Diagnosis not present

## 2018-12-27 DIAGNOSIS — E1165 Type 2 diabetes mellitus with hyperglycemia: Secondary | ICD-10-CM | POA: Diagnosis not present

## 2018-12-27 DIAGNOSIS — J3089 Other allergic rhinitis: Secondary | ICD-10-CM

## 2018-12-27 LAB — NOVEL CORONAVIRUS, NAA: SARS-CoV-2, NAA: NOT DETECTED

## 2018-12-27 MED ORDER — OZEMPIC (0.25 OR 0.5 MG/DOSE) 2 MG/1.5ML ~~LOC~~ SOPN: 0.5000 mg | PEN_INJECTOR | SUBCUTANEOUS | 3 refills | Status: DC

## 2018-12-27 MED ORDER — LORATADINE 10 MG PO TABS: 10.0000 mg | ORAL_TABLET | Freq: Every day | ORAL | 5 refills | Status: DC

## 2018-12-27 MED ORDER — VALACYCLOVIR HCL 1 G PO TABS: ORAL_TABLET | ORAL | 0 refills | Status: DC

## 2018-12-27 MED ORDER — METFORMIN HCL ER 500 MG PO TB24: ORAL_TABLET | ORAL | 3 refills | Status: DC

## 2018-12-27 NOTE — Progress Notes (Signed)
[facility] [place], [place] [address]  Internal MEDICINE  Telephone Visit  Patient Name: Mandy Shea  [id number]  [id number]  Date of Service: 01/12/2019  I connected with the patient at 5:03pm by webcam and verified the patients identity using two identifiers.   I discussed the limitations, risks, security and privacy concerns of performing an evaluation and management service by webcam and the availability of in person appointments. I also discussed with the patient that there may be a patient responsible charge related to the service.  The patient expressed understanding and agrees to proceed.    Chief Complaint  Patient presents with  . Telephone Assessment  . Telephone Screen  . Diabetes  . Sleep Apnea  . Medical Management of Chronic Issues    102 morning and 140 afternoon last today 113,91   . Quality Metric Gaps    urne microalbumin     The patient has been contacted via telephone for follow up visit due to concerns for spread of novel coronavirus. The patient states that she is doing well and needs to have refills for some of her routine medications. She states that she has been getting fever blisters much more frequently. Would like to have new prescription for valacyclovir as this helps to lower the length of time her fever blisters last and how severe they become.       Current Medication: Outpatient Encounter Medications as of 12/27/2018  Medication Sig  . BD PEN NEEDLE NANO U/F 32G X 4 MM MISC USE AS DIRECTED WITH VICTOZA PEN  . diclofenac sodium (VOLTAREN) 1 % GEL Apply topically as needed.  . loratadine (CLARITIN) 10 MG tablet Take 1 tablet (10 mg total) by mouth daily.  . metFORMIN (GLUCOPHAGE-XR) 500 MG 24 hr tablet TAKE 1 TABLET BY MOUTH EVERY DAY FOR DIABETES  . montelukast (SINGULAIR) 10 MG tablet Take 1 tablet (10 mg total) by mouth daily.  . Semaglutide,0.25 or 0.5MG /DOS, (OZEMPIC, 0.25 OR 0.5 MG/DOSE,) 2 MG/1.5ML SOPN  Inject 0.5 mg into the skin once a week.  . valACYclovir (VALTREX) 1000 MG tablet Take 1  Tab by po twice a day for 5 days and take 1 tab by po daily  to prevent fever blister  . [DISCONTINUED] loratadine (CLARITIN) 10 MG tablet Take 1 tablet (10 mg total) by mouth daily.  . [DISCONTINUED] metFORMIN (GLUCOPHAGE-XR) 500 MG 24 hr tablet TAKE 1 TABLET BY MOUTH EVERY DAY FOR DIABETES  . [DISCONTINUED] Semaglutide,0.25 or 0.5MG /DOS, (OZEMPIC, 0.25 OR 0.5 MG/DOSE,) 2 MG/1.5ML SOPN Inject 0.5 mg into the skin once a week.  . [DISCONTINUED] valACYclovir (VALTREX) 1000 MG tablet Take 1  Tab by po twice a day for 5 days and take 1 tab by po daily  to prevent fever blister   No facility-administered encounter medications on file as of 12/27/2018.     Surgical History: Past Surgical History:  Procedure Laterality Date  . CHOLECYSTECTOMY  09-26-13  . CYSTOSCOPY N/A 01/12/2017   Procedure: CYSTOSCOPY;  Surgeon: Malachy Mood, MD;  Location: [facility] ORS;  Service: Gynecology;  Laterality: N/A;  . HYSTEROSCOPY W/D&C  11/24/2016   Procedure: DILATATION AND CURETTAGE /HYSTEROSCOPY;  Surgeon: Malachy Mood, MD;  Location: [facility] ORS;  Service: Gynecology;;  . KNEE SURGERY Right   . LEEP  2010   [facility]  . TONSILLECTOMY  2014  . TOTAL LAPAROSCOPIC HYSTERECTOMY WITH SALPINGECTOMY Right 01/12/2017   Procedure: TOTAL LAPAROSCOPIC HYSTERECTOMY WITH RIGHT SALPINGECTOMY;  Surgeon: Malachy Mood, MD;  Location:  [facility] ORS;  Service: Gynecology;  Laterality: Right;  . TUBAL LIGATION  1997    Medical History: Past Medical History:  Diagnosis Date  . Anemia    AS A CHILD  . Diabetes mellitus without complication ([place])   . Fibroids   . Hemorrhoids   . Seasonal allergies   . Sleep apnea    NO CPAP    Family History: Family History  Problem Relation Age of Onset  . Breast cancer Neg Hx     Social History   Socioeconomic History  . Marital status: Married    Spouse name: Not on file  . Number of  children: Not on file  . Years of education: Not on file  . Highest education level: Not on file  Occupational History  . Not on file  Social Needs  . Financial resource strain: Not on file  . Food insecurity    Worry: Not on file    Inability: Not on file  . Transportation needs    Medical: Not on file    Non-medical: Not on file  Tobacco Use  . Smoking status: Never Smoker  . Smokeless tobacco: Never Used  Substance and Sexual Activity  . Alcohol use: No  . Drug use: No  . Sexual activity: Yes    Birth control/protection: None  Lifestyle  . Physical activity    Days per week: Not on file    Minutes per session: Not on file  . Stress: Not on file  Relationships  . Social Herbalist on phone: Not on file    Gets together: Not on file    Attends religious service: Not on file    Active member of club or organization: Not on file    Attends meetings of clubs or organizations: Not on file    Relationship status: Not on file  . Intimate partner violence    Fear of current or ex partner: Not on file    Emotionally abused: Not on file    Physically abused: Not on file    Forced sexual activity: Not on file  Other Topics Concern  . Not on file  Social History Narrative  . Not on file      Review of Systems  Constitutional: Negative for activity change, chills, fatigue and unexpected weight change.  HENT: Negative for congestion, postnasal drip, rhinorrhea, sneezing and sore throat.        More frequent fever blisters.   Respiratory: Negative for cough, chest tightness, shortness of breath and wheezing.   Cardiovascular: Negative for chest pain and palpitations.  Gastrointestinal: Negative for abdominal pain, constipation, diarrhea, nausea and vomiting.  Endocrine: Negative for cold intolerance, heat intolerance, polydipsia and polyuria.       Blood sugars improving.   Musculoskeletal: Negative for arthralgias, back pain, joint swelling and neck pain.  Skin:  Negative for rash.  Neurological: Negative for dizziness, tremors, numbness and headaches.  Hematological: Negative for adenopathy. Does not bruise/bleed easily.  Psychiatric/Behavioral: Negative for behavioral problems (Depression), sleep disturbance and suicidal ideas. The patient is not nervous/anxious.    Today's Vitals   12/27/18 1618  Weight: 171 lb (77.6 kg)  Height: 5\' 4"  (1.626 m)   Body mass index is 29.35 kg/m.  Observation/Objective:   The patient is alert and oriented. She is pleasant and answers all questions appropriately. Breathing is non-labored. She is in no acute distress at this time.    Assessment/Plan: 1. Type 2 diabetes mellitus with  hyperglycemia, without long-term current use of insulin ([facility]) Improved blood sugars. Continue metformin and ozempic as prescribed. Refills provided today. - metFORMIN (GLUCOPHAGE-XR) 500 MG 24 hr tablet; TAKE 1 TABLET BY MOUTH EVERY DAY FOR DIABETES  Dispense: 30 tablet; Refill: 3 - Semaglutide,0.25 or 0.5MG /DOS, (OZEMPIC, 0.25 OR 0.5 MG/DOSE,) 2 MG/1.5ML SOPN; Inject 0.5 mg into the skin once a week.  Dispense: 1 pen; Refill: 3  2. Seasonal allergic rhinitis due to other allergic trigger - loratadine (CLARITIN) 10 MG tablet; Take 1 tablet (10 mg total) by mouth daily.  Dispense: 30 tablet; Refill: 5  3. Fever blister Take valacyclovir 1000mg  twice daily for 5 days when needed. Start use at onset of fever blister.  - valACYclovir (VALTREX) 1000 MG tablet; Take 1  Tab by po twice a day for 5 days and take 1 tab by po daily  to prevent fever blister  Dispense: 60 tablet; Refill: 0  General Counseling: Naliyah verbalizes understanding of the findings of today's phone visit and agrees with plan of treatment. I have discussed any further diagnostic evaluation that may be needed or ordered today. We also reviewed her medications today. she has been encouraged to call the office with any questions or concerns that should arise related to  todays visit.   This patient was seen by [place] with Dr Lavera Guise as a part of collaborative care agreement  Meds ordered this encounter  Medications  . metFORMIN (GLUCOPHAGE-XR) 500 MG 24 hr tablet    Sig: TAKE 1 TABLET BY MOUTH EVERY DAY FOR DIABETES    Dispense:  30 tablet    Refill:  3    Order Specific Question:   Supervising Provider    Answer:   Lavera Guise [id number]  . loratadine (CLARITIN) 10 MG tablet    Sig: Take 1 tablet (10 mg total) by mouth daily.    Dispense:  30 tablet    Refill:  5    Order Specific Question:   Supervising Provider    Answer:   Lavera Guise [id number]  . valACYclovir (VALTREX) 1000 MG tablet    Sig: Take 1  Tab by po twice a day for 5 days and take 1 tab by po daily  to prevent fever blister    Dispense:  60 tablet    Refill:  0    Order Specific Question:   Supervising Provider    Answer:   Lavera Guise [place]  . Semaglutide,0.25 or 0.5MG /DOS, (OZEMPIC, 0.25 OR 0.5 MG/DOSE,) 2 MG/1.5ML SOPN    Sig: Inject 0.5 mg into the skin once a week.    Dispense:  1 pen    Refill:  3    Started dosing 08/2018. Samples provied until now.    Order Specific Question:   Supervising Provider    Answer:   Lavera Guise [id number]    Time spent: 17 Minutes    Dr Lavera Guise Internal medicine

## 2019-01-12 DIAGNOSIS — B001 Herpesviral vesicular dermatitis: Secondary | ICD-10-CM | POA: Insufficient documentation

## 2019-01-12 DIAGNOSIS — J309 Allergic rhinitis, unspecified: Secondary | ICD-10-CM | POA: Insufficient documentation

## 2019-02-05 ENCOUNTER — Telehealth: Payer: Self-pay

## 2019-02-05 NOTE — Telephone Encounter (Signed)
CONFIRM AND SCREENED FOR 02-07-19 OV.

## 2019-02-07 ENCOUNTER — Ambulatory Visit: Payer: BC Managed Care – PPO | Admitting: Nurse Practitioner

## 2019-02-07 ENCOUNTER — Other Ambulatory Visit: Payer: Self-pay

## 2019-02-07 ENCOUNTER — Encounter: Payer: Self-pay | Admitting: Nurse Practitioner

## 2019-02-07 VITALS — BP 127/92 | HR 76 | Temp 97.4°F | Ht 64.0 in | Wt 175.0 lb

## 2019-02-07 DIAGNOSIS — E1165 Type 2 diabetes mellitus with hyperglycemia: Secondary | ICD-10-CM | POA: Diagnosis not present

## 2019-02-07 DIAGNOSIS — N390 Urinary tract infection, site not specified: Secondary | ICD-10-CM

## 2019-02-07 DIAGNOSIS — R319 Hematuria, unspecified: Secondary | ICD-10-CM

## 2019-02-07 DIAGNOSIS — B373 Candidiasis of vulva and vagina: Secondary | ICD-10-CM | POA: Diagnosis not present

## 2019-02-07 DIAGNOSIS — R300 Dysuria: Secondary | ICD-10-CM

## 2019-02-07 DIAGNOSIS — B3731 Acute candidiasis of vulva and vagina: Secondary | ICD-10-CM

## 2019-02-07 LAB — POCT URINALYSIS DIPSTICK
Bilirubin, UA: POSITIVE
Glucose, UA: NEGATIVE
Ketones, UA: NEGATIVE
Nitrite, UA: NEGATIVE
Protein, UA: POSITIVE — AB
Spec Grav, UA: 1.015 (ref 1.010–1.025)
Urobilinogen, UA: >=8 E.U./dL — AB
pH, UA: 6.5 (ref 5.0–8.0)

## 2019-02-07 LAB — POCT GLYCOSYLATED HEMOGLOBIN (HGB A1C): Hemoglobin A1C: 6 % — AB (ref 4.0–5.6)

## 2019-02-07 MED ORDER — FLUCONAZOLE 150 MG PO TABS: ORAL_TABLET | ORAL | 0 refills | Status: DC

## 2019-02-07 MED ORDER — SULFAMETHOXAZOLE-TRIMETHOPRIM 800-160 MG PO TABS: 1.0000 | ORAL_TABLET | Freq: Two times a day (BID) | ORAL | 0 refills | Status: DC

## 2019-02-07 NOTE — Progress Notes (Signed)
[facility] [place], [place] [address]  Internal MEDICINE  Office Visit Note  Patient Name: Mandy Shea  [id number]  [id number]  Date of Service: 02/10/2019  Chief Complaint  Patient presents with  . Diabetes  . Vaginitis    The patient is here for routine follow up. She states that blood sugars are doing much better. Added ozempic 0.5mg  weekly. Today, her HgbA1c is 6.0 today, down from 9.6 at her last visit. She is tolerating this medication very well.  She states that she has been having urinary frequency along with urgency and pain with urination for past few weeks. This has been intermittent, but gradually getting more severe. She denies nausea, vomiting, or diarrhea. She denies fever or headache.       Current Medication: Outpatient Encounter Medications as of 02/07/2019  Medication Sig  . BD PEN NEEDLE NANO U/F 32G X 4 MM MISC USE AS DIRECTED WITH VICTOZA PEN  . diclofenac sodium (VOLTAREN) 1 % GEL Apply topically as needed.  . loratadine (CLARITIN) 10 MG tablet Take 1 tablet (10 mg total) by mouth daily.  . metFORMIN (GLUCOPHAGE-XR) 500 MG 24 hr tablet TAKE 1 TABLET BY MOUTH EVERY DAY FOR DIABETES  . montelukast (SINGULAIR) 10 MG tablet Take 1 tablet (10 mg total) by mouth daily.  . Semaglutide,0.25 or 0.5MG /DOS, (OZEMPIC, 0.25 OR 0.5 MG/DOSE,) 2 MG/1.5ML SOPN Inject 0.5 mg into the skin once a week.  . valACYclovir (VALTREX) 1000 MG tablet Take 1  Tab by po twice a day for 5 days and take 1 tab by po daily  to prevent fever blister  . fluconazole (DIFLUCAN) 150 MG tablet Take 1 tablet po once. May repeat dose in 3 days as needed for persistent symptoms.  Marland Kitchen sulfamethoxazole-trimethoprim (BACTRIM DS) 800-160 MG tablet Take 1 tablet by mouth 2 (two) times daily.   No facility-administered encounter medications on file as of 02/07/2019.    Surgical History: Past Surgical History:  Procedure Laterality Date  . CHOLECYSTECTOMY  09-26-13  .  CYSTOSCOPY N/A 01/12/2017   Procedure: CYSTOSCOPY;  Surgeon: Malachy Mood, MD;  Location: [facility] ORS;  Service: Gynecology;  Laterality: N/A;  . HYSTEROSCOPY W/D&C  11/24/2016   Procedure: DILATATION AND CURETTAGE /HYSTEROSCOPY;  Surgeon: Malachy Mood, MD;  Location: [facility];  Service: Gynecology;;  . KNEE SURGERY Right   . LEEP  2010   [facility]  . TONSILLECTOMY  2014  . TOTAL LAPAROSCOPIC HYSTERECTOMY WITH SALPINGECTOMY Right 01/12/2017   Procedure: TOTAL LAPAROSCOPIC HYSTERECTOMY WITH RIGHT SALPINGECTOMY;  Surgeon: Malachy Mood, MD;  Location: [facility] ORS;  Service: Gynecology;  Laterality: Right;  . TUBAL LIGATION  1997    Medical History: Past Medical History:  Diagnosis Date  . Anemia    AS A CHILD  . Diabetes mellitus without complication ([place])   . Fibroids   . Hemorrhoids   . Seasonal allergies   . Sleep apnea    NO CPAP    Family History: Family History  Problem Relation Age of Onset  . Breast cancer Neg Hx     Social History   Socioeconomic History  . Marital status: Married    Spouse name: Not on file  . Number of children: Not on file  . Years of education: Not on file  . Highest education level: Not on file  Occupational History  . Not on file  Tobacco Use  . Smoking status: Never Smoker  . Smokeless tobacco: Never Used  Substance and Sexual Activity  .  Alcohol use: No  . Drug use: No  . Sexual activity: Yes    Birth control/protection: None  Other Topics Concern  . Not on file  Social History Narrative  . Not on file   Social Determinants of Health   Financial Resource Strain:   . Difficulty of Paying Living Expenses: Not on file  Food Insecurity:   . Worried About Charity fundraiser in the Last Year: Not on file  . Ran Out of Food in the Last Year: Not on file  Transportation Needs:   . Lack of Transportation (Medical): Not on file  . Lack of Transportation (Non-Medical): Not on file  Physical Activity:   . Days of Exercise  per Week: Not on file  . Minutes of Exercise per Session: Not on file  Stress:   . Feeling of Stress : Not on file  Social Connections:   . Frequency of Communication with Friends and Family: Not on file  . Frequency of Social Gatherings with Friends and Family: Not on file  . Attends Religious Services: Not on file  . Active Member of Clubs or Organizations: Not on file  . Attends Archivist Meetings: Not on file  . Marital Status: Not on file  Intimate Partner Violence:   . Fear of Current or Ex-Partner: Not on file  . Emotionally Abused: Not on file  . Physically Abused: Not on file  . Sexually Abused: Not on file      Review of Systems  Constitutional: Negative for activity change, chills, fatigue and unexpected weight change.  HENT: Negative for congestion, postnasal drip, rhinorrhea, sneezing and sore throat.        More frequent fever blisters.   Respiratory: Negative for cough, chest tightness, shortness of breath and wheezing.   Cardiovascular: Negative for chest pain and palpitations.  Gastrointestinal: Negative for abdominal pain, constipation, diarrhea, nausea and vomiting.  Endocrine: Negative for cold intolerance, heat intolerance, polydipsia and polyuria.       Much improved blood sugars.   Genitourinary: Positive for dysuria, frequency and urgency.  Musculoskeletal: Positive for back pain. Negative for arthralgias, joint swelling and neck pain.  Skin: Negative for rash.  Allergic/Immunologic: Negative for environmental allergies.  Neurological: Negative for dizziness, tremors, numbness and headaches.  Hematological: Negative for adenopathy. Does not bruise/bleed easily.  Psychiatric/Behavioral: Negative for behavioral problems (Depression), sleep disturbance and suicidal ideas. The patient is not nervous/anxious.     Today's Vitals   02/07/19 1548  BP: (!) 127/92  Pulse: 76  Temp: (!) 97.4 F (36.3 C)  SpO2: 98%  Weight: 175 lb (79.4 kg)   Height: 5\' 4"  (1.626 m)   Body mass index is 30.04 kg/m.  Physical Exam Vitals and nursing note reviewed.  Constitutional:      General: She is not in acute distress.    Appearance: Normal appearance. She is well-developed. She is not diaphoretic.  HENT:     Head: Normocephalic and atraumatic.     Mouth/Throat:     Pharynx: No oropharyngeal exudate.  Eyes:     Extraocular Movements: Extraocular movements intact.     Pupils: Pupils are equal, round, and reactive to light.  Neck:     Thyroid: No thyromegaly.     Vascular: No JVD.     Trachea: No tracheal deviation.  Cardiovascular:     Rate and Rhythm: Normal rate and regular rhythm.     Heart sounds: Normal heart sounds. No murmur. No friction rub. No  gallop.   Pulmonary:     Effort: Pulmonary effort is normal. No respiratory distress.     Breath sounds: Normal breath sounds. No wheezing or rales.  Chest:     Chest wall: No tenderness.  Abdominal:     General: Bowel sounds are normal.     Palpations: Abdomen is soft.     Tenderness: There is no abdominal tenderness.  Genitourinary:    Comments: Urine positive for protein as well as large WBC and moderate blood.  Musculoskeletal:        General: Normal range of motion.     Cervical back: Normal range of motion and neck supple.  Lymphadenopathy:     Cervical: No cervical adenopathy.  Skin:    General: Skin is warm and dry.  Neurological:     Mental Status: She is alert and oriented to person, place, and time.     Cranial Nerves: No cranial nerve deficit.  Psychiatric:        Behavior: Behavior normal.        Thought Content: Thought content normal.        Judgment: Judgment normal.    Assessment/Plan: 1. Urinary tract infection with hematuria, site unspecified Start bactrim DS bid for 10 days. Send urine for culture and sensitivity and adjust antibiotics as indicated.  - sulfamethoxazole-trimethoprim (BACTRIM DS) 800-160 MG tablet; Take 1 tablet by mouth 2 (two)  times daily.  Dispense: 20 tablet; Refill: 0  2. Type 2 diabetes mellitus with hyperglycemia, without long-term current use of insulin (HCC) - POCT HgB A1C 6.0 down from 9.6 at her most recent visit. Continue diabetic medication as prescribed   3. Vaginal yeast infection May take diflucan once if yeast infection develops. May repeat dose in three days for persistent symptoms.  - fluconazole (DIFLUCAN) 150 MG tablet; Take 1 tablet po once. May repeat dose in 3 days as needed for persistent symptoms.  Dispense: 3 tablet; Refill: 0  4. Dysuria - POCT Urinalysis Dipstick - CULTURE, URINE COMPREHENSIVE  General Counseling: Norlene verbalizes understanding of the findings of todays visit and agrees with plan of treatment. I have discussed any further diagnostic evaluation that may be needed or ordered today. We also reviewed her medications today. she has been encouraged to call the office with any questions or concerns that should arise related to todays visit.  Diabetes Counseling:  1. Addition of ACE inh/ ARB'S for nephroprotection. Microalbumin is updated  2. Diabetic foot care, prevention of complications. Podiatry consult 3. Exercise and lose weight.  4. Diabetic eye examination, Diabetic eye exam is updated  5. Monitor blood sugar closlely. nutrition counseling.  6. Sign and symptoms of hypoglycemia including shaking sweating,confusion and headaches.  This patient was seen by Leretha Pol FNP Collaboration with Dr Lavera Guise as a part of collaborative care agreement  Orders Placed This Encounter  Procedures  . CULTURE, URINE COMPREHENSIVE  . POCT Urinalysis Dipstick  . POCT HgB A1C    Meds ordered this encounter  Medications  . sulfamethoxazole-trimethoprim (BACTRIM DS) 800-160 MG tablet    Sig: Take 1 tablet by mouth 2 (two) times daily.    Dispense:  20 tablet    Refill:  0    Order Specific Question:   Supervising Provider    Answer:   Lavera Guise [id number]  . fluconazole  (DIFLUCAN) 150 MG tablet    Sig: Take 1 tablet po once. May repeat dose in 3 days as needed for persistent symptoms.  Dispense:  3 tablet    Refill:  0    Order Specific Question:   Supervising Provider    Answer:   Lavera Guise [id number]    Time spent: 60 Minutes      Dr Lavera Guise Internal medicine

## 2019-02-09 NOTE — Progress Notes (Signed)
Patient started on bactrim at the time of her visit.

## 2019-02-10 LAB — CULTURE, URINE COMPREHENSIVE

## 2019-02-26 ENCOUNTER — Telehealth: Payer: Self-pay

## 2019-02-26 NOTE — Telephone Encounter (Signed)
Patient called stating that her Dr office gave her the number to sigh up for COVID-19 testing.  She states she was exposed to COVID-19 positive patient. Patient was given information on scheduling for testing.  She was reminded to please quarantine from others for 14 day post positive exposure.  She verbalized understanding.

## 2019-02-27 ENCOUNTER — Ambulatory Visit: Payer: BC Managed Care – PPO | Attending: Internal Medicine

## 2019-02-27 DIAGNOSIS — Z20822 Contact with and (suspected) exposure to covid-19: Secondary | ICD-10-CM

## 2019-02-28 LAB — NOVEL CORONAVIRUS, NAA: SARS-CoV-2, NAA: NOT DETECTED

## 2019-03-20 ENCOUNTER — Other Ambulatory Visit: Payer: Self-pay

## 2019-03-20 DIAGNOSIS — B001 Herpesviral vesicular dermatitis: Secondary | ICD-10-CM

## 2019-03-20 MED ORDER — VALACYCLOVIR HCL 1 G PO TABS: ORAL_TABLET | ORAL | 0 refills | Status: DC

## 2019-04-16 ENCOUNTER — Other Ambulatory Visit: Payer: Self-pay

## 2019-04-16 DIAGNOSIS — E1165 Type 2 diabetes mellitus with hyperglycemia: Secondary | ICD-10-CM

## 2019-04-16 MED ORDER — OZEMPIC (0.25 OR 0.5 MG/DOSE) 2 MG/1.5ML ~~LOC~~ SOPN: 0.5000 mg | PEN_INJECTOR | SUBCUTANEOUS | 3 refills | Status: DC

## 2019-05-28 ENCOUNTER — Telehealth: Payer: Self-pay

## 2019-05-28 NOTE — Telephone Encounter (Signed)
Called lmom informing patient of appointment on 05/30/2019. klh

## 2019-05-30 ENCOUNTER — Ambulatory Visit: Payer: BC Managed Care – PPO | Admitting: Adult Health

## 2019-06-06 ENCOUNTER — Telehealth: Payer: Self-pay

## 2019-06-06 NOTE — Telephone Encounter (Signed)
Confirmed and screened for 06-10-19 ov. 

## 2019-06-10 ENCOUNTER — Ambulatory Visit (INDEPENDENT_AMBULATORY_CARE_PROVIDER_SITE_OTHER): Payer: BC Managed Care – PPO | Admitting: Nurse Practitioner

## 2019-06-10 ENCOUNTER — Other Ambulatory Visit: Payer: Self-pay

## 2019-06-10 ENCOUNTER — Encounter: Payer: Self-pay | Admitting: Nurse Practitioner

## 2019-06-10 VITALS — BP 125/86 | HR 77 | Temp 97.4°F | Resp 16 | Ht 64.0 in | Wt 172.8 lb

## 2019-06-10 DIAGNOSIS — B001 Herpesviral vesicular dermatitis: Secondary | ICD-10-CM

## 2019-06-10 DIAGNOSIS — J3089 Other allergic rhinitis: Secondary | ICD-10-CM

## 2019-06-10 DIAGNOSIS — E1165 Type 2 diabetes mellitus with hyperglycemia: Secondary | ICD-10-CM

## 2019-06-10 LAB — POCT GLYCOSYLATED HEMOGLOBIN (HGB A1C): Hemoglobin A1C: 5.9 % — AB (ref 4.0–5.6)

## 2019-06-10 NOTE — Progress Notes (Signed)
[facility] [place], [place] [address]  Internal MEDICINE  Office Visit Note  Patient Name: Mandy Shea  [id number]  [id number]  Date of Service: 06/22/2019  Chief Complaint  Patient presents with  . Diabetes  . Anemia  . Arm Pain    left arm pain going all the way down to hand and mostly the thumb, index and middle fingers hurt. arm feels like sometimes it gets lazy, hard to lift, had a hard time even driving    The patient is here for routine follow up. She continues to have well controlled diabetes. Her HgbA1c is 5.9 today. Taking medications as prescribed. She has no concerns or complaints today. She has had both of her Moderna COVID 19 vaccines. Second dose documented in her medication record.       Current Medication: Outpatient Encounter Medications as of 06/10/2019  Medication Sig  . BD PEN NEEDLE NANO U/F 32G X 4 MM MISC USE AS DIRECTED WITH VICTOZA PEN  . diclofenac sodium (VOLTAREN) 1 % GEL Apply topically as needed.  . loratadine (CLARITIN) 10 MG tablet Take 1 tablet (10 mg total) by mouth daily.  . metFORMIN (GLUCOPHAGE-XR) 500 MG 24 hr tablet TAKE 1 TABLET BY MOUTH EVERY DAY FOR DIABETES  . Semaglutide,0.25 or 0.5MG /DOS, (OZEMPIC, 0.25 OR 0.5 MG/DOSE,) 2 MG/1.5ML SOPN Inject 0.5 mg into the skin once a week.  . [DISCONTINUED] montelukast (SINGULAIR) 10 MG tablet Take 1 tablet (10 mg total) by mouth daily.  . [DISCONTINUED] valACYclovir (VALTREX) 1000 MG tablet Take 1  Tab by po twice a day for 5 days and take 1 tab by po daily  to prevent fever blister  . [DISCONTINUED] fluconazole (DIFLUCAN) 150 MG tablet Take 1 tablet po once. May repeat dose in 3 days as needed for persistent symptoms. (Patient not taking: Reported on 06/10/2019)  . [DISCONTINUED] sulfamethoxazole-trimethoprim (BACTRIM DS) 800-160 MG tablet Take 1 tablet by mouth 2 (two) times daily. (Patient not taking: Reported on 06/10/2019)   No facility-administered encounter  medications on file as of 06/10/2019.    Surgical History: Past Surgical History:  Procedure Laterality Date  . CHOLECYSTECTOMY  09-26-13  . CYSTOSCOPY N/A 01/12/2017   Procedure: CYSTOSCOPY;  Surgeon: Malachy Mood, MD;  Location: [facility] ORS;  Service: Gynecology;  Laterality: N/A;  . HYSTEROSCOPY WITH D & C  11/24/2016   Procedure: DILATATION AND CURETTAGE /HYSTEROSCOPY;  Surgeon: Malachy Mood, MD;  Location: [facility] OR[facility];  Service: Gynecology;;  . KNEE SURGERY Right   . LEEP  2010   [facility]  . TONSILLECTOMY  2014  . TOTAL LAPAROSCOPIC HYSTERECTOMY WITH SALPINGECTOMY Right 01/12/2017   Procedure: TOTAL LAPAROSCOPIC HYSTERECTOMY WITH RIGHT SALPINGECTOMY;  Surgeon: Malachy Mood, MD;  Location: [facility] OR[facility];  Service: Gynecology;  Laterality: Right;  . TUBAL LIGATION  1997    Medical History: Past Medical History:  Diagnosis Date  . Anemia    AS A CHILD  . Diabetes mellitus without complication ([place])   . Fibroids   . Hemorrhoids   . Seasonal allergies   . Sleep apnea    NO CPAP    Family History: Family History  Problem Relation Age of Onset  . Breast cancer Neg Hx     Social History   Socioeconomic History  . Marital status: Married    Spouse name: Not on file  . Number of children: Not on file  . Years of education: Not on file  . Highest education level: Not on file  Occupational History  . Not on file  Tobacco Use  . Smoking status: Never Smoker  . Smokeless tobacco: Never Used  Substance and Sexual Activity  . Alcohol use: No  . Drug use: No  . Sexual activity: Yes    Birth control/protection: None  Other Topics Concern  . Not on file  Social History Narrative  . Not on file   Social Determinants of Health   Financial Resource Strain:   . Difficulty of Paying Living Expenses:   Food Insecurity:   . Worried About Charity fundraiser in the Last Year:   . Arboriculturist in the Last Year:   Transportation Needs:   . Film/video editor  (Medical):   Marland Kitchen Lack of Transportation (Non-Medical):   Physical Activity:   . Days of Exercise per Week:   . Minutes of Exercise per Session:   Stress:   . Feeling of Stress :   Social Connections:   . Frequency of Communication with Friends and Family:   . Frequency of Social Gatherings with Friends and Family:   . Attends Religious Services:   . Active Member of Clubs or Organizations:   . Attends Archivist Meetings:   Marland Kitchen Marital Status:   Intimate Partner Violence:   . Fear of Current or Ex-Partner:   . Emotionally Abused:   Marland Kitchen Physically Abused:   . Sexually Abused:       Review of Systems  Constitutional: Negative for activity change, chills, fatigue and unexpected weight change.  HENT: Negative for congestion, postnasal drip, rhinorrhea, sneezing and sore throat.   Respiratory: Negative for cough, chest tightness, shortness of breath and wheezing.   Cardiovascular: Negative for chest pain and palpitations.  Gastrointestinal: Negative for abdominal pain, constipation, diarrhea, nausea and vomiting.  Endocrine: Negative for cold intolerance, heat intolerance, polydipsia and polyuria.       Blood sugars doing well   Musculoskeletal: Negative for arthralgias, back pain, joint swelling and neck pain.  Skin: Negative for rash.  Allergic/Immunologic: Positive for environmental allergies.  Neurological: Negative for dizziness, tremors, numbness and headaches.  Hematological: Negative for adenopathy. Does not bruise/bleed easily.  Psychiatric/Behavioral: Negative for behavioral problems (Depression), sleep disturbance and suicidal ideas. The patient is not nervous/anxious.     Today's Vitals   06/10/19 1605  BP: 125/86  Pulse: 77  Resp: 16  Temp: (!) 97.4 F (36.3 C)  SpO2: 97%  Weight: 172 lb 12.8 oz (78.4 kg)  Height: 5\' 4"  (1.626 m)   Body mass index is 29.66 kg/m.  Physical Exam Vitals and nursing note reviewed.  Constitutional:      General: She is  not in acute distress.    Appearance: Normal appearance. She is well-developed. She is not diaphoretic.  HENT:     Head: Normocephalic and atraumatic.     Nose: Nose normal.     Mouth/Throat:     Pharynx: No oropharyngeal exudate.  Eyes:     Extraocular Movements: Extraocular movements intact.     Pupils: Pupils are equal, round, and reactive to light.  Neck:     Thyroid: No thyromegaly.     Vascular: No carotid bruit or JVD.     Trachea: No tracheal deviation.  Cardiovascular:     Rate and Rhythm: Normal rate and regular rhythm.     Heart sounds: Normal heart sounds. No murmur. No friction rub. No gallop.   Pulmonary:     Effort: Pulmonary effort is normal. No  respiratory distress.     Breath sounds: Normal breath sounds. No wheezing or rales.  Chest:     Chest wall: No tenderness.  Abdominal:     Palpations: Abdomen is soft.  Musculoskeletal:        General: Normal range of motion.     Cervical back: Normal range of motion and neck supple.  Lymphadenopathy:     Cervical: No cervical adenopathy.  Skin:    General: Skin is warm and dry.  Neurological:     Mental Status: She is alert and oriented to person, place, and time.     Cranial Nerves: No cranial nerve deficit.  Psychiatric:        Mood and Affect: Mood normal.        Behavior: Behavior normal.        Thought Content: Thought content normal.        Judgment: Judgment normal.   Assessment/Plan: 1. Type 2 diabetes mellitus with hyperglycemia, without long-term current use of insulin (HCC) - POCT HgB A1C 5.9 today. Continue to take diabetic medication as prescribed. Monitor blood sugars closely.  2. Seasonal allergic rhinitis due to other allergic trigger Stable. Continue to take allergy medications daily.   3. Fever blister Improved.   General Counseling: Mandy Shea verbalizes understanding of the findings of todays visit and agrees with plan of treatment. I have discussed any further diagnostic evaluation that may  be needed or ordered today. We also reviewed her medications today. she has been encouraged to call the office with any questions or concerns that should arise related to todays visit.  Diabetes Counseling:  1. Addition of ACE inh/ ARB'S for nephroprotection. Microalbumin is updated  2. Diabetic foot care, prevention of complications. Podiatry consult 3. Exercise and lose weight.  4. Diabetic eye examination, Diabetic eye exam is updated  5. Monitor blood sugar closlely. nutrition counseling.  6. Sign and symptoms of hypoglycemia including shaking sweating,confusion and headaches.  This patient was seen by Leretha Pol FNP Collaboration with Dr Lavera Guise as a part of collaborative care agreement  Orders Placed This Encounter  Procedures  . POCT HgB A1C      Total time spent: 30 Minutes   Time spent includes review of chart, medications, test results, and follow up plan with the patient.      Dr Lavera Guise Internal medicine

## 2019-06-12 ENCOUNTER — Other Ambulatory Visit: Payer: Self-pay

## 2019-06-12 DIAGNOSIS — B001 Herpesviral vesicular dermatitis: Secondary | ICD-10-CM

## 2019-06-12 MED ORDER — MONTELUKAST SODIUM 10 MG PO TABS: 10.0000 mg | ORAL_TABLET | Freq: Every day | ORAL | 5 refills | Status: DC

## 2019-06-12 MED ORDER — VALACYCLOVIR HCL 1 G PO TABS: ORAL_TABLET | ORAL | 0 refills | Status: DC

## 2019-07-15 ENCOUNTER — Other Ambulatory Visit: Payer: Self-pay

## 2019-07-15 MED ORDER — FLUCONAZOLE 150 MG PO TABS: ORAL_TABLET | ORAL | 0 refills | Status: DC

## 2019-07-15 NOTE — Telephone Encounter (Signed)
Pt called she had yeast infection as per heather send diflucan and advised if not feeling better need to been seen

## 2019-08-05 ENCOUNTER — Other Ambulatory Visit: Payer: Self-pay

## 2019-08-05 DIAGNOSIS — J3089 Other allergic rhinitis: Secondary | ICD-10-CM

## 2019-08-05 DIAGNOSIS — E1165 Type 2 diabetes mellitus with hyperglycemia: Secondary | ICD-10-CM

## 2019-08-05 MED ORDER — LORATADINE 10 MG PO TABS: 10.0000 mg | ORAL_TABLET | Freq: Every day | ORAL | 5 refills | Status: DC

## 2019-08-05 MED ORDER — OZEMPIC (0.25 OR 0.5 MG/DOSE) 2 MG/1.5ML ~~LOC~~ SOPN: 0.5000 mg | PEN_INJECTOR | SUBCUTANEOUS | 3 refills | Status: DC

## 2019-10-01 ENCOUNTER — Other Ambulatory Visit: Payer: Self-pay | Admitting: Internal Medicine

## 2019-10-01 DIAGNOSIS — Z1231 Encounter for screening mammogram for malignant neoplasm of breast: Secondary | ICD-10-CM

## 2019-10-08 ENCOUNTER — Telehealth: Payer: Self-pay

## 2019-10-08 NOTE — Telephone Encounter (Signed)
Lmom to confirm and screen for 10-10-19 ov.

## 2019-10-08 NOTE — Telephone Encounter (Signed)
Confirmed and screened for 10-10-19 ov. 

## 2019-10-10 ENCOUNTER — Other Ambulatory Visit: Payer: Self-pay

## 2019-10-10 ENCOUNTER — Encounter: Payer: Self-pay | Admitting: Nurse Practitioner

## 2019-10-10 ENCOUNTER — Ambulatory Visit (INDEPENDENT_AMBULATORY_CARE_PROVIDER_SITE_OTHER): Payer: BC Managed Care – PPO | Admitting: Nurse Practitioner

## 2019-10-10 VITALS — BP 130/89 | HR 74 | Temp 97.5°F | Resp 16 | Ht 64.0 in | Wt 165.4 lb

## 2019-10-10 DIAGNOSIS — J3089 Other allergic rhinitis: Secondary | ICD-10-CM

## 2019-10-10 DIAGNOSIS — E1165 Type 2 diabetes mellitus with hyperglycemia: Secondary | ICD-10-CM | POA: Diagnosis not present

## 2019-10-10 DIAGNOSIS — R300 Dysuria: Secondary | ICD-10-CM

## 2019-10-10 DIAGNOSIS — L209 Atopic dermatitis, unspecified: Secondary | ICD-10-CM

## 2019-10-10 DIAGNOSIS — Z0001 Encounter for general adult medical examination with abnormal findings: Secondary | ICD-10-CM | POA: Diagnosis not present

## 2019-10-10 LAB — POCT GLYCOSYLATED HEMOGLOBIN (HGB A1C): Hemoglobin A1C: 5.5 % (ref 4.0–5.6)

## 2019-10-10 NOTE — Progress Notes (Signed)
[facility] [place], [place] [address]  Internal MEDICINE  Office Visit Note  Patient Name: Mandy Shea  [id number]  [id number]  Date of Service: 10/27/2019   Pt is here for routine health maintenance examination    Chief Complaint  Patient presents with  . Annual Exam  . Diabetes  . Quality Metric Gaps    hep c screening     The patient is here for health maintenance exam.  -blood sugars good. HgbA1c 5.5 today -blood pressure well managed.  -eating healthier and exercising more frequently. Lost 10 pounds since her last visit.  -due to have routine, fasting labs. -scheduled for screening mammogram 11/06/2019. Does not wish to have manual breast exam. -has had both Moderna COVID 19 vaccines. These are documented in her immunization history.   Current Medication: Outpatient Encounter Medications as of 10/10/2019  Medication Sig  . BD PEN NEEDLE NANO U/F 32G X 4 MM MISC USE AS DIRECTED WITH VICTOZA PEN  . diclofenac sodium (VOLTAREN) 1 % GEL Apply topically as needed.  . fluconazole (DIFLUCAN) 150 MG tablet Take 1 tab po once for yeast infection may repeat in 3 days if symptoms persist  . loratadine (CLARITIN) 10 MG tablet Take 1 tablet (10 mg total) by mouth daily.  . metFORMIN (GLUCOPHAGE-XR) 500 MG 24 hr tablet TAKE 1 TABLET BY MOUTH EVERY DAY FOR DIABETES  . montelukast (SINGULAIR) 10 MG tablet Take 1 tablet (10 mg total) by mouth daily.  . Semaglutide,0.25 or 0.5MG /DOS, (OZEMPIC, 0.25 OR 0.5 MG/DOSE,) 2 MG/1.5ML SOPN Inject 0.375 mLs (0.5 mg total) into the skin once a week.  . valACYclovir (VALTREX) 1000 MG tablet Take 1  Tab by po twice a day for 5 days and take 1 tab by po daily  to prevent fever blister   No facility-administered encounter medications on file as of 10/10/2019.    Surgical History: Past Surgical History:  Procedure Laterality Date  . CHOLECYSTECTOMY  09-26-13  . CYSTOSCOPY N/A 01/12/2017   Procedure: CYSTOSCOPY;   Surgeon: Malachy Mood, MD;  Location: [facility] ORS;  Service: Gynecology;  Laterality: N/A;  . HYSTEROSCOPY WITH D & C  11/24/2016   Procedure: DILATATION AND CURETTAGE /HYSTEROSCOPY;  Surgeon: Malachy Mood, MD;  Location: [facility] ORS;  Service: Gynecology;;  . KNEE SURGERY Right   . LEEP  2010   [facility]  . TONSILLECTOMY  2014  . TOTAL LAPAROSCOPIC HYSTERECTOMY WITH SALPINGECTOMY Right 01/12/2017   Procedure: TOTAL LAPAROSCOPIC HYSTERECTOMY WITH RIGHT SALPINGECTOMY;  Surgeon: Malachy Mood, MD;  Location: [facility] ORS;  Service: Gynecology;  Laterality: Right;  . TUBAL LIGATION  1997    Medical History: Past Medical History:  Diagnosis Date  . Anemia    AS A CHILD  . Diabetes mellitus without complication ([place])   . Fibroids   . Hemorrhoids   . Seasonal allergies   . Sleep apnea    NO CPAP    Family History: Family History  Problem Relation Age of Onset  . Breast cancer Neg Hx       Review of Systems  Constitutional: Negative for activity change, chills, fatigue and unexpected weight change.  HENT: Negative for congestion, postnasal drip, rhinorrhea, sneezing and sore throat.   Respiratory: Negative for cough, chest tightness, shortness of breath and wheezing.   Cardiovascular: Negative for chest pain and palpitations.  Gastrointestinal: Negative for abdominal pain, constipation, diarrhea, nausea and vomiting.  Endocrine: Negative for cold intolerance, heat intolerance, polydipsia and polyuria.  Blood sugars doing well   Musculoskeletal: Negative for arthralgias, back pain, joint swelling and neck pain.  Skin: Negative for rash.  Allergic/Immunologic: Positive for environmental allergies.  Neurological: Negative for dizziness, tremors, numbness and headaches.  Hematological: Negative for adenopathy. Does not bruise/bleed easily.  Psychiatric/Behavioral: Negative for behavioral problems (Depression), sleep disturbance and suicidal ideas. The patient is not  nervous/anxious.      Today's Vitals   10/10/19 1454  BP: 130/89  Pulse: 74  Resp: 16  Temp: (!) 97.5 F (36.4 C)  SpO2: 98%  Weight: 165 lb 6.4 oz (75 kg)  Height: 5\' 4"  (1.626 m)   Body mass index is 28.39 kg/m.  Physical Exam Vitals and nursing note reviewed.  Constitutional:      General: She is not in acute distress.    Appearance: Normal appearance. She is well-developed. She is not diaphoretic.  HENT:     Head: Normocephalic and atraumatic.     Nose: Nose normal.     Mouth/Throat:     Pharynx: No oropharyngeal exudate.  Eyes:     Extraocular Movements: Extraocular movements intact.     Pupils: Pupils are equal, round, and reactive to light.  Neck:     Thyroid: No thyromegaly.     Vascular: No carotid bruit or JVD.     Trachea: No tracheal deviation.  Cardiovascular:     Rate and Rhythm: Normal rate and regular rhythm.     Heart sounds: Normal heart sounds. No murmur heard.  No friction rub. No gallop.   Pulmonary:     Effort: Pulmonary effort is normal. No respiratory distress.     Breath sounds: Normal breath sounds. No wheezing or rales.  Chest:     Chest wall: No tenderness.     Breasts:        Right: Normal. No swelling, bleeding, inverted nipple, mass, nipple discharge, skin change or tenderness.        Left: Normal. No swelling, bleeding, inverted nipple, mass, nipple discharge, skin change or tenderness.  Abdominal:     Palpations: Abdomen is soft.  Musculoskeletal:        General: Normal range of motion.     Cervical back: Normal range of motion and neck supple.  Lymphadenopathy:     Cervical: No cervical adenopathy.     Upper Body:     Right upper body: No axillary adenopathy.     Left upper body: No axillary adenopathy.  Skin:    General: Skin is warm and dry.  Neurological:     Mental Status: She is alert and oriented to person, place, and time.     Cranial Nerves: No cranial nerve deficit.  Psychiatric:        Mood and Affect: Mood  normal.        Behavior: Behavior normal.        Thought Content: Thought content normal.        Judgment: Judgment normal.      LABS: Recent Results (from the past 2160 hour(s))  POCT HgB A1C     Status: None   Collection Time: 10/10/19  3:12 PM  Result Value Ref Range   Hemoglobin A1C 5.5 4.0 - 5.6 %   HbA1c POC (<> result, manual entry)     HbA1c, POC (prediabetic range)     HbA1c, POC (controlled diabetic range)    UA/M w/rflx Culture, Routine     Status: None   Collection Time: 10/10/19  3:38 PM  Specimen: Urine   Urine  Result Value Ref Range   Specific Gravity, UA 1.020 1.005 - 1.030   pH, UA 6.5 5.0 - 7.5   Color, UA Yellow Yellow   Appearance Ur Clear Clear   Leukocytes,UA Negative Negative   Protein,UA Negative Negative/Trace   Glucose, UA Negative Negative   Ketones, UA Negative Negative   RBC, UA Negative Negative   Bilirubin, UA Negative Negative   Urobilinogen, Ur 1.0 0.2 - 1.0 mg/dL   Nitrite, UA Negative Negative   Microscopic Examination Comment     Comment: Microscopic follows if indicated.   Microscopic Examination See below:     Comment: Microscopic was indicated and was performed.   Urinalysis Reflex Comment     Comment: This specimen will not reflex to a Urine Culture.  Microscopic Examination     Status: None   Collection Time: 10/10/19  3:38 PM   Urine  Result Value Ref Range   WBC, UA 0-5 0 - 5 /hpf   RBC 0-2 0 - 2 /hpf   Epithelial Cells (non renal) 0-10 0 - 10 /hpf   Casts None seen None seen /lpf   Bacteria, UA Few None seen/Few    Assessment/Plan: 1. Encounter for general adult medical examination with abnormal findings Annual health maintenance exam today.   2. Type 2 diabetes mellitus with hyperglycemia, without long-term current use of insulin (HCC) - POCT HgB A1C 5.5 today. Continue diabetic medication as prescribed.   3. Seasonal allergic rhinitis due to other allergic trigger contineu allergy medication as prescribed    4. Atopic dermatitis, unspecified type Stable. Continue regular visits with dermatology as scheduled and as needed.  5. Dysuria - UA/M w/rflx Culture, Routine  General Counseling: Deshayla verbalizes understanding of the findings of todays visit and agrees with plan of treatment. I have discussed any further diagnostic evaluation that may be needed or ordered today. We also reviewed her medications today. she has been encouraged to call the office with any questions or concerns that should arise related to todays visit.    Counseling:  This patient was seen by Leretha Pol FNP Collaboration with Dr Lavera Guise as a part of collaborative care agreement  Orders Placed This Encounter  Procedures  . Microscopic Examination  . UA/M w/rflx Culture, Routine  . POCT HgB A1C      Total time spent: 45 Minutes  Time spent includes review of chart, medications, test results, and follow up plan with the patient.     Lavera Guise, MD  Internal Medicine

## 2019-10-11 LAB — UA/M W/RFLX CULTURE, ROUTINE
Bilirubin, UA: NEGATIVE
Glucose, UA: NEGATIVE
Ketones, UA: NEGATIVE
Leukocytes,UA: NEGATIVE
Nitrite, UA: NEGATIVE
Protein,UA: NEGATIVE
RBC, UA: NEGATIVE
Specific Gravity, UA: 1.02 (ref 1.005–1.030)
Urobilinogen, Ur: 1 mg/dL (ref 0.2–1.0)
pH, UA: 6.5 (ref 5.0–7.5)

## 2019-10-11 LAB — MICROSCOPIC EXAMINATION: Casts: NONE SEEN /lpf

## 2019-10-30 ENCOUNTER — Other Ambulatory Visit: Payer: Self-pay

## 2019-10-30 DIAGNOSIS — E1165 Type 2 diabetes mellitus with hyperglycemia: Secondary | ICD-10-CM

## 2019-10-30 MED ORDER — METFORMIN HCL ER 500 MG PO TB24: ORAL_TABLET | ORAL | 3 refills | Status: DC

## 2019-11-06 ENCOUNTER — Ambulatory Visit
Admission: RE | Admit: 2019-11-06 | Discharge: 2019-11-06 | Disposition: A | Discharge status: Home / Self Care | Payer: BC Managed Care – PPO | Source: Ambulatory Visit | Attending: Internal Medicine | Admitting: Internal Medicine

## 2019-11-06 DIAGNOSIS — Z1231 Encounter for screening mammogram for malignant neoplasm of breast: Secondary | ICD-10-CM | POA: Diagnosis present

## 2019-11-25 ENCOUNTER — Emergency Department
Admission: EM | Admit: 2019-11-25 | Discharge: 2019-11-25 | Disposition: A | Discharge status: Home / Self Care | Payer: BC Managed Care – PPO | Attending: Emergency Medicine | Admitting: Emergency Medicine

## 2019-11-25 ENCOUNTER — Other Ambulatory Visit: Payer: Self-pay

## 2019-11-25 ENCOUNTER — Encounter: Payer: Self-pay | Admitting: Emergency Medicine

## 2019-11-25 DIAGNOSIS — M545 Low back pain: Secondary | ICD-10-CM | POA: Diagnosis present

## 2019-11-25 DIAGNOSIS — Z7984 Long term (current) use of oral hypoglycemic drugs: Secondary | ICD-10-CM | POA: Diagnosis not present

## 2019-11-25 DIAGNOSIS — E1165 Type 2 diabetes mellitus with hyperglycemia: Secondary | ICD-10-CM | POA: Diagnosis not present

## 2019-11-25 MED ORDER — MELOXICAM 15 MG PO TABS: 15.0000 mg | ORAL_TABLET | Freq: Every day | ORAL | 1 refills | Status: AC

## 2019-11-25 MED ORDER — METHOCARBAMOL 500 MG PO TABS: 500.0000 mg | ORAL_TABLET | Freq: Three times a day (TID) | ORAL | 0 refills | Status: AC | PRN
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
## 2019-11-25 NOTE — ED Notes (Signed)
Patient declined discharge vital signs. 

## 2019-12-04 ENCOUNTER — Other Ambulatory Visit: Payer: Self-pay

## 2019-12-04 DIAGNOSIS — E1165 Type 2 diabetes mellitus with hyperglycemia: Secondary | ICD-10-CM

## 2019-12-04 MED ORDER — OZEMPIC (0.25 OR 0.5 MG/DOSE) 2 MG/1.5ML ~~LOC~~ SOPN: 0.5000 mg | PEN_INJECTOR | SUBCUTANEOUS | 3 refills | Status: DC

## 2019-12-05 ENCOUNTER — Encounter: Payer: Self-pay | Admitting: Internal Medicine

## 2019-12-05 ENCOUNTER — Ambulatory Visit: Payer: BC Managed Care – PPO | Admitting: Internal Medicine

## 2019-12-05 VITALS — Temp 97.7°F | Resp 16 | Ht 64.0 in | Wt 163.0 lb

## 2019-12-05 DIAGNOSIS — R030 Elevated blood-pressure reading, without diagnosis of hypertension: Secondary | ICD-10-CM | POA: Diagnosis not present

## 2019-12-05 DIAGNOSIS — M545 Low back pain, unspecified: Secondary | ICD-10-CM | POA: Diagnosis not present

## 2019-12-05 DIAGNOSIS — G44309 Post-traumatic headache, unspecified, not intractable: Secondary | ICD-10-CM | POA: Diagnosis not present
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
## 2019-12-09 ENCOUNTER — Encounter: Payer: Self-pay | Admitting: Adult Health

## 2019-12-09 ENCOUNTER — Ambulatory Visit (INDEPENDENT_AMBULATORY_CARE_PROVIDER_SITE_OTHER): Payer: BC Managed Care – PPO | Admitting: Adult Health

## 2019-12-09 ENCOUNTER — Other Ambulatory Visit: Payer: Self-pay

## 2019-12-09 VITALS — BP 126/88 | HR 78 | Temp 98.3°F | Resp 16 | Ht 64.0 in | Wt 162.0 lb

## 2019-12-09 DIAGNOSIS — I10 Essential (primary) hypertension: Secondary | ICD-10-CM

## 2019-12-09 DIAGNOSIS — S060X0D Concussion without loss of consciousness, subsequent encounter: Secondary | ICD-10-CM

## 2019-12-09 MED ORDER — AMLODIPINE BESYLATE 2.5 MG PO TABS: 2.5000 mg | ORAL_TABLET | Freq: Every day | ORAL | 0 refills | Status: DC
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
## 2019-12-12 ENCOUNTER — Other Ambulatory Visit: Payer: Self-pay

## 2019-12-12 MED ORDER — CYCLOBENZAPRINE HCL 10 MG PO TABS
10.0000 mg | ORAL_TABLET | Freq: Every day | ORAL | 0 refills | Status: DC
Start: 2019-12-12 — End: 2020-05-25

## 2019-12-12 NOTE — Telephone Encounter (Signed)
Pt called that need refill for cyclobenzaprine as per send for 15 days

## 2019-12-31 ENCOUNTER — Other Ambulatory Visit: Payer: Self-pay

## 2019-12-31 ENCOUNTER — Ambulatory Visit (INDEPENDENT_AMBULATORY_CARE_PROVIDER_SITE_OTHER): Payer: BC Managed Care – PPO | Admitting: Hospice and Palliative Medicine

## 2019-12-31 ENCOUNTER — Encounter: Payer: Self-pay | Admitting: Internal Medicine

## 2019-12-31 DIAGNOSIS — I10 Essential (primary) hypertension: Secondary | ICD-10-CM

## 2019-12-31 DIAGNOSIS — F411 Generalized anxiety disorder: Secondary | ICD-10-CM | POA: Diagnosis not present

## 2019-12-31 MED ORDER — ESCITALOPRAM OXALATE 5 MG PO TABS: 5.0000 mg | ORAL_TABLET | Freq: Every day | ORAL | 0 refills | Status: DC
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
## 2020-01-02 ENCOUNTER — Encounter: Payer: Self-pay | Admitting: Hospice and Palliative Medicine

## 2020-01-03 ENCOUNTER — Telehealth: Payer: Self-pay

## 2020-01-03 ENCOUNTER — Other Ambulatory Visit: Payer: Self-pay

## 2020-01-03 DIAGNOSIS — I10 Essential (primary) hypertension: Secondary | ICD-10-CM

## 2020-01-03 MED ORDER — AMLODIPINE BESYLATE 2.5 MG PO TABS: 2.5000 mg | ORAL_TABLET | Freq: Every day | ORAL | 3 refills | Status: DC

## 2020-01-03 NOTE — Telephone Encounter (Signed)
Pt called with bp readings 9am: 172/90 11:16 am: 158/96  Informed taylor and she advised for pt to continue with amlodipine 2.5 mg

## 2020-01-09 ENCOUNTER — Other Ambulatory Visit: Payer: Self-pay | Admitting: Nurse Practitioner

## 2020-01-10 LAB — CBC
Hematocrit: 41.9 % (ref 34.0–46.6)
Hemoglobin: 14.7 g/dL (ref 11.1–15.9)
MCH: 31 pg (ref 26.6–33.0)
MCHC: 35.1 g/dL (ref 31.5–35.7)
MCV: 88 fL (ref 79–97)
Platelets: 320 10*3/uL (ref 150–450)
RBC: 4.74 x10E6/uL (ref 3.77–5.28)
RDW: 11.1 % — ABNORMAL LOW (ref 11.7–15.4)
WBC: 4 10*3/uL (ref 3.4–10.8)

## 2020-01-10 LAB — LIPID PANEL WITH LDL/HDL RATIO
Cholesterol, Total: 197 mg/dL (ref 100–199)
HDL: 55 mg/dL (ref >39)
LDL Chol Calc (NIH): 130 mg/dL — ABNORMAL HIGH (ref 0–99)
LDL/HDL Ratio: 2.4 ratio (ref 0.0–3.2)
Triglycerides: 63 mg/dL (ref 0–149)
VLDL Cholesterol Cal: 12 mg/dL (ref 5–40)

## 2020-01-10 LAB — COMPREHENSIVE METABOLIC PANEL
ALT: 17 IU/L (ref 0–32)
AST: 22 IU/L (ref 0–40)
Albumin/Globulin Ratio: 1.8 (ref 1.2–2.2)
Albumin: 4.9 g/dL (ref 3.8–4.9)
Alkaline Phosphatase: 69 IU/L (ref 44–121)
BUN/Creatinine Ratio: 18 (ref 9–23)
BUN: 18 mg/dL (ref 6–24)
Bilirubin Total: 0.6 mg/dL (ref 0.0–1.2)
CO2: 25 mmol/L (ref 20–29)
Calcium: 10.2 mg/dL (ref 8.7–10.2)
Chloride: 100 mmol/L (ref 96–106)
Creatinine, Ser: 0.99 mg/dL (ref 0.57–1.00)
GFR calc Af Amer: 76 mL/min/{1.73_m2} (ref >59)
GFR calc non Af Amer: 66 mL/min/{1.73_m2} (ref >59)
Globulin, Total: 2.7 g/dL (ref 1.5–4.5)
Glucose: 103 mg/dL — ABNORMAL HIGH (ref 65–99)
Potassium: 4.4 mmol/L (ref 3.5–5.2)
Sodium: 140 mmol/L (ref 134–144)
Total Protein: 7.6 g/dL (ref 6.0–8.5)

## 2020-01-10 LAB — T4, FREE: Free T4: 1.07 ng/dL (ref 0.82–1.77)

## 2020-01-10 LAB — FSH/LH
FSH: 89.4 m[IU]/mL
LH: 52.3 m[IU]/mL

## 2020-01-10 LAB — VITAMIN D 25 HYDROXY (VIT D DEFICIENCY, FRACTURES): Vit D, 25-Hydroxy: 35.7 ng/mL (ref 30.0–100.0)

## 2020-01-10 LAB — TSH: TSH: 1.55 u[IU]/mL (ref 0.450–4.500)

## 2020-01-13 NOTE — Progress Notes (Signed)
Overall, labs ok. Discuss with patient at next visit.

## 2020-01-27 ENCOUNTER — Telehealth: Payer: Self-pay

## 2020-01-27 NOTE — Telephone Encounter (Signed)
Pt called that having side effect with lexapro she was sweating as per taylor advised to stopped med and make her appt tomorrow to discuss about labs and new med for anxiety

## 2020-01-28 ENCOUNTER — Ambulatory Visit: Payer: BC Managed Care – PPO | Admitting: Nurse Practitioner

## 2020-01-28 ENCOUNTER — Other Ambulatory Visit: Payer: Self-pay

## 2020-01-28 ENCOUNTER — Encounter: Payer: Self-pay | Admitting: Nurse Practitioner

## 2020-01-28 VITALS — BP 127/84 | HR 90 | Temp 97.6°F | Resp 16 | Ht 64.0 in | Wt 157.8 lb

## 2020-01-28 DIAGNOSIS — J3089 Other allergic rhinitis: Secondary | ICD-10-CM

## 2020-01-28 DIAGNOSIS — I10 Essential (primary) hypertension: Secondary | ICD-10-CM | POA: Diagnosis not present

## 2020-01-28 DIAGNOSIS — N959 Unspecified menopausal and perimenopausal disorder: Secondary | ICD-10-CM | POA: Diagnosis not present

## 2020-01-28 DIAGNOSIS — E1165 Type 2 diabetes mellitus with hyperglycemia: Secondary | ICD-10-CM

## 2020-01-28 MED ORDER — ESTRADIOL 0.0375 MG/24HR TD PTWK: 0.0375 mg | MEDICATED_PATCH | TRANSDERMAL | 11 refills | Status: DC

## 2020-01-28 MED ORDER — OZEMPIC (0.25 OR 0.5 MG/DOSE) 2 MG/1.5ML ~~LOC~~ SOPN: 0.5000 mg | PEN_INJECTOR | SUBCUTANEOUS | 3 refills | Status: DC

## 2020-01-28 MED ORDER — MONTELUKAST SODIUM 10 MG PO TABS: 10.0000 mg | ORAL_TABLET | Freq: Every day | ORAL | 5 refills | Status: DC

## 2020-01-28 MED ORDER — LORATADINE 10 MG PO TABS: 10.0000 mg | ORAL_TABLET | Freq: Every day | ORAL | 5 refills | Status: DC

## 2020-01-28 NOTE — Progress Notes (Signed)
[facility] [place], [place] [address]  Internal MEDICINE  Office Visit Note  Patient Name: Mandy Shea  [id number]  [id number]  Date of Service: 03/01/2020  Chief Complaint  Patient presents with  . Follow-up    real bad hot flashes  . Medication Refill  . Diabetes  . controlled substance form    reviewed with PT    The patient is here for follow up. She was recently seen and treated for severe hot flashes and night sweats, believed to be caused by menopause. She was started on lexapro 5mg . States that she felt well on the medication, however, after a few weeks, noted increased sweating. Found that sweating is expected side effect of lexapro for some people and she stopped taking it. Labs, [facility] and [facility], indicate patient is postmenopausal. She did have total laparoscopic hysterectomy in 2018. She denies dyspareunia, vaginal dryness, or vaginal discomfort.   Her most recent check of HgbA1c was done 09/2019 and was 5.5. she controls her blood sugars very well. Blood pressure is well managed. She does have chronic allergies. Takes singulair along with claritin. Needs refills for these today.       Current Medication: Outpatient Encounter Medications as of 01/28/2020  Medication Sig Note  . amLODipine (NORVASC) 2.5 MG tablet Take 1 tablet (2.5 mg total) by mouth daily.   . BD PEN NEEDLE NANO U/F 32G X 4 MM MISC USE AS DIRECTED WITH VICTOZA PEN   . cyclobenzaprine (FLEXERIL) 10 MG tablet Take 1 tablet (10 mg total) by mouth at bedtime.   . diclofenac sodium (VOLTAREN) 1 % GEL Apply topically as needed.   . loratadine (CLARITIN) 10 MG tablet Take 1 tablet (10 mg total) by mouth daily.   . metFORMIN (GLUCOPHAGE-XR) 500 MG 24 hr tablet TAKE 1 TABLET BY MOUTH EVERY DAY FOR DIABETES   . montelukast (SINGULAIR) 10 MG tablet Take 1 tablet (10 mg total) by mouth daily.   . Semaglutide,0.25 or 0.5MG /DOS, (OZEMPIC, 0.25 OR 0.5 MG/DOSE,) 2 MG/1.5ML SOPN Inject 0.5 mg  into the skin once a week.   . valACYclovir (VALTREX) 1000 MG tablet Take 1  Tab by po twice a day for 5 days and take 1 tab by po daily  to prevent fever blister   . [DISCONTINUED] escitalopram (LEXAPRO) 5 MG tablet Take 1 tablet (5 mg total) by mouth daily. 01/28/2020: caused increased sweating and worse hot flashes   . [DISCONTINUED] loratadine (CLARITIN) 10 MG tablet Take 1 tablet (10 mg total) by mouth daily.   . [DISCONTINUED] montelukast (SINGULAIR) 10 MG tablet Take 1 tablet (10 mg total) by mouth daily.   . [DISCONTINUED] Semaglutide,0.25 or 0.5MG /DOS, (OZEMPIC, 0.25 OR 0.5 MG/DOSE,) 2 MG/1.5ML SOPN Inject 0.5 mg into the skin once a week.   . estradiol (CLIMARA - DOSED IN MG/24 HR) 0.0375 mg/24hr patch Place 1 patch (0.0375 mg total) onto the skin once a week.    No facility-administered encounter medications on file as of 01/28/2020.    Surgical History: Past Surgical History:  Procedure Laterality Date  . CHOLECYSTECTOMY  09-26-13  . CYSTOSCOPY N/A 01/12/2017   Procedure: CYSTOSCOPY;  Surgeon: Malachy Mood, MD;  Location: [facility] ORS;  Service: Gynecology;  Laterality: N/A;  . HYSTEROSCOPY WITH D & C  11/24/2016   Procedure: DILATATION AND CURETTAGE /HYSTEROSCOPY;  Surgeon: Malachy Mood, MD;  Location: [facility] ORS;  Service: Gynecology;;  . KNEE SURGERY Right   . LEEP  2010   [facility]  .  TONSILLECTOMY  2014  . TOTAL LAPAROSCOPIC HYSTERECTOMY WITH SALPINGECTOMY Right 01/12/2017   Procedure: TOTAL LAPAROSCOPIC HYSTERECTOMY WITH RIGHT SALPINGECTOMY;  Surgeon: Malachy Mood, MD;  Location: [facility] ORS;  Service: Gynecology;  Laterality: Right;  . TUBAL LIGATION  1997    Medical History: Past Medical History:  Diagnosis Date  . Anemia    AS A CHILD  . Diabetes mellitus without complication ([place])   . Fibroids   . Hemorrhoids   . Seasonal allergies   . Sleep apnea    NO CPAP    Family History: Family History  Problem Relation Age of Onset  . Diabetes Sister   .  Breast cancer Neg Hx     Social History   Socioeconomic History  . Marital status: Married    Spouse name: Not on file  . Number of children: Not on file  . Years of education: Not on file  . Highest education level: Not on file  Occupational History  . Not on file  Tobacco Use  . Smoking status: Never Smoker  . Smokeless tobacco: Never Used  Vaping Use  . Vaping Use: Never used  Substance and Sexual Activity  . Alcohol use: No  . Drug use: No  . Sexual activity: Yes    Birth control/protection: None  Other Topics Concern  . Not on file  Social History Narrative  . Not on file   Social Determinants of Health   Financial Resource Strain: Not on file  Food Insecurity: Not on file  Transportation Needs: Not on file  Physical Activity: Not on file  Stress: Not on file  Social Connections: Not on file  Intimate Partner Violence: Not on file      Review of Systems  Constitutional: Negative for activity change, chills, fatigue and unexpected weight change.  HENT: Positive for postnasal drip and rhinorrhea. Negative for congestion, sneezing and sore throat.   Respiratory: Negative for cough, chest tightness, shortness of breath and wheezing.   Cardiovascular: Negative for chest pain and palpitations.  Gastrointestinal: Negative for abdominal pain, constipation, diarrhea, nausea and vomiting.  Endocrine: Positive for heat intolerance. Negative for cold intolerance, polydipsia and polyuria.       Blood sugars doing well   Genitourinary: Negative for dyspareunia, dysuria and frequency.  Musculoskeletal: Negative for arthralgias, back pain, joint swelling and neck pain.  Skin: Negative for rash.  Allergic/Immunologic: Positive for environmental allergies.  Neurological: Negative for dizziness, tremors, numbness and headaches.  Hematological: Negative for adenopathy. Does not bruise/bleed easily.  Psychiatric/Behavioral: Negative for behavioral problems (Depression), sleep  disturbance and suicidal ideas. The patient is not nervous/anxious.     Today's Vitals   01/28/20 1454  BP: 127/84  Pulse: 90  Resp: 16  Temp: 97.6 F (36.4 C)  SpO2: 99%  Weight: 157 lb 12.8 oz (71.6 kg)  Height: 5\' 4"  (1.626 m)   Body mass index is 27.09 kg/m.  Physical Exam Vitals and nursing note reviewed.  Constitutional:      General: She is not in acute distress.    Appearance: Normal appearance. She is well-developed and well-nourished. She is not diaphoretic.  HENT:     Head: Normocephalic and atraumatic.     Right Ear: Tympanic membrane normal.     Left Ear: Tympanic membrane normal.     Nose: Nose normal.     Mouth/Throat:     Mouth: Oropharynx is clear and moist.     Pharynx: No oropharyngeal exudate.  Eyes:  Extraocular Movements: EOM normal.     Pupils: Pupils are equal, round, and reactive to light.  Neck:     Thyroid: No thyromegaly.     Vascular: No carotid bruit or JVD.     Trachea: No tracheal deviation.  Cardiovascular:     Rate and Rhythm: Normal rate and regular rhythm.     Heart sounds: Normal heart sounds. No murmur heard. No friction rub. No gallop.   Pulmonary:     Effort: Pulmonary effort is normal. No respiratory distress.     Breath sounds: Normal breath sounds. No wheezing or rales.  Chest:     Chest wall: No tenderness.  Abdominal:     Palpations: Abdomen is soft.  Musculoskeletal:        General: Normal range of motion.     Cervical back: Normal range of motion and neck supple.  Lymphadenopathy:     Cervical: No cervical adenopathy.  Skin:    General: Skin is warm and dry.  Neurological:     General: No focal deficit present.     Mental Status: She is alert and oriented to person, place, and time.     Cranial Nerves: No cranial nerve deficit.  Psychiatric:        Mood and Affect: Mood and affect and mood normal.        Behavior: Behavior normal.        Thought Content: Thought content normal.        Judgment: Judgment  normal.    Assessment/Plan: 1. Menopausal and postmenopausal disorder D/c lexapro as this was making hot flashes and sweating worse. Start low dose estradiol patch. Reviewed instructions for use and patient voiced understanding.  - estradiol (CLIMARA - DOSED IN MG/24 HR) 0.0375 mg/24hr patch; Place 1 patch (0.0375 mg total) onto the skin once a week.  Dispense: 4 patch; Refill: 11  2. Type 2 diabetes mellitus with hyperglycemia, without long-term current use of insulin (HCC) Well managed. Continue ozempic and metformin as prescribed  - Semaglutide,0.25 or 0.5MG /DOS, (OZEMPIC, 0.25 OR 0.5 MG/DOSE,) 2 MG/1.5ML SOPN; Inject 0.5 mg into the skin once a week.  Dispense: 1.5 mL; Refill: 3  3. Seasonal allergic rhinitis due to other allergic trigger Continue singulair and calritin daily as prescribed. Refills provided today  - loratadine (CLARITIN) 10 MG tablet; Take 1 tablet (10 mg total) by mouth daily.  Dispense: 30 tablet; Refill: 5 - montelukast (SINGULAIR) 10 MG tablet; Take 1 tablet (10 mg total) by mouth daily.  Dispense: 30 tablet; Refill: 5  4. Hypertension, unspecified type Stable. Continue bp medication as prescribed   General Counseling: Seara verbalizes understanding of the findings of todays visit and agrees with plan of treatment. I have discussed any further diagnostic evaluation that may be needed or ordered today. We also reviewed her medications today. she has been encouraged to call the office with any questions or concerns that should arise related to todays visit.   This patient was seen by [place] with Dr Lavera Guise as a part of collaborative care agreement  Meds ordered this encounter  Medications  . estradiol (CLIMARA - DOSED IN MG/24 HR) 0.0375 mg/24hr patch    Sig: Place 1 patch (0.0375 mg total) onto the skin once a week.    Dispense:  4 patch    Refill:  11    Order Specific Question:   Supervising Provider    Answer:   Lavera Guise  [[id number]]  .  Semaglutide,0.25 or 0.5MG /DOS, (OZEMPIC, 0.25 OR 0.5 MG/DOSE,) 2 MG/1.5ML SOPN    Sig: Inject 0.5 mg into the skin once a week.    Dispense:  1.5 mL    Refill:  3    Started dosing 08/2018. Samples provied until now.    Order Specific Question:   Supervising Provider    Answer:   Lavera Guise [[id number]]  . loratadine (CLARITIN) 10 MG tablet    Sig: Take 1 tablet (10 mg total) by mouth daily.    Dispense:  30 tablet    Refill:  5    Order Specific Question:   Supervising Provider    Answer:   Lavera Guise [[id number]]  . montelukast (SINGULAIR) 10 MG tablet    Sig: Take 1 tablet (10 mg total) by mouth daily.    Dispense:  30 tablet    Refill:  5    Order Specific Question:   Supervising Provider    Answer:   Lavera Guise [[id number]]    Total time spent: 25 Minutes  s Time spent includes review of chart, medications, test results, and follow up plan with the patient.      Dr Lavera Guise Internal medicine

## 2020-02-10 ENCOUNTER — Ambulatory Visit: Payer: BC Managed Care – PPO | Admitting: Nurse Practitioner

## 2020-02-26 ENCOUNTER — Telehealth: Payer: Self-pay

## 2020-02-26 ENCOUNTER — Other Ambulatory Visit: Payer: Self-pay | Admitting: Nurse Practitioner

## 2020-02-26 DIAGNOSIS — J069 Acute upper respiratory infection, unspecified: Secondary | ICD-10-CM

## 2020-02-26 MED ORDER — AZITHROMYCIN 250 MG PO TABS: ORAL_TABLET | ORAL | 0 refills | Status: DC

## 2020-02-26 NOTE — Telephone Encounter (Signed)
Hey I sent her zpack. Take as directed. She should also take OTC zinc, vitamin d, and vitamin c every day to help boost immune system. She can continue to take other OTC medications as needed and as indicated for acute symptoms. Please have her notify us with her results when they are available.. thanks.

## 2020-02-27 NOTE — Telephone Encounter (Signed)
Great!

## 2020-02-27 NOTE — Telephone Encounter (Signed)
I called pt to inform her about her medications.  Pt informed me that her test was POSITIVE for Covid

## 2020-03-01 DIAGNOSIS — I10 Essential (primary) hypertension: Secondary | ICD-10-CM | POA: Insufficient documentation

## 2020-03-03 ENCOUNTER — Ambulatory Visit: Payer: BC Managed Care – PPO | Admitting: Hospice and Palliative Medicine

## 2020-05-25 ENCOUNTER — Ambulatory Visit (INDEPENDENT_AMBULATORY_CARE_PROVIDER_SITE_OTHER): Payer: BC Managed Care – PPO | Admitting: Hospice and Palliative Medicine

## 2020-05-25 ENCOUNTER — Encounter: Payer: Self-pay | Admitting: Hospice and Palliative Medicine

## 2020-05-25 ENCOUNTER — Other Ambulatory Visit: Payer: Self-pay

## 2020-05-25 VITALS — BP 126/90 | HR 93 | Temp 97.5°F | Resp 16 | Ht 64.0 in | Wt 164.2 lb

## 2020-05-25 DIAGNOSIS — I10 Essential (primary) hypertension: Secondary | ICD-10-CM | POA: Diagnosis not present

## 2020-05-25 DIAGNOSIS — E1165 Type 2 diabetes mellitus with hyperglycemia: Secondary | ICD-10-CM

## 2020-05-25 DIAGNOSIS — J3089 Other allergic rhinitis: Secondary | ICD-10-CM

## 2020-05-25 DIAGNOSIS — F411 Generalized anxiety disorder: Secondary | ICD-10-CM

## 2020-05-25 LAB — POCT GLYCOSYLATED HEMOGLOBIN (HGB A1C): Hemoglobin A1C: 6 % — AB (ref 4.0–5.6)

## 2020-05-25 MED ORDER — MONTELUKAST SODIUM 10 MG PO TABS: 10.0000 mg | ORAL_TABLET | Freq: Every day | ORAL | 5 refills | Status: DC

## 2020-05-25 MED ORDER — LORATADINE 10 MG PO TABS: 10.0000 mg | ORAL_TABLET | Freq: Every day | ORAL | 5 refills | Status: DC

## 2020-05-25 MED ORDER — OZEMPIC (0.25 OR 0.5 MG/DOSE) 2 MG/1.5ML ~~LOC~~ SOPN: 0.5000 mg | PEN_INJECTOR | SUBCUTANEOUS | 3 refills | Status: DC

## 2020-05-25 MED ORDER — AMLODIPINE BESYLATE 2.5 MG PO TABS: 2.5000 mg | ORAL_TABLET | Freq: Every day | ORAL | 3 refills | Status: DC

## 2020-05-25 MED ORDER — METFORMIN HCL ER 500 MG PO TB24: ORAL_TABLET | ORAL | 3 refills | Status: DC

## 2020-05-25 NOTE — Progress Notes (Signed)
[facility] [place], [place] [address]  Internal MEDICINE  Office Visit Note  Patient Name: Mandy Shea  [id number]  [id number]  Date of Service: 05/26/2020  Chief Complaint  Patient presents with  . Follow-up    Discuss anxiety   . Diabetes  . Sleep Apnea  . Anemia    HPI Patient is here for routine follow-up Tried on Lexapro at our last visit together, was unable to tolerate as she reports increased perspiration At last visit with other provider was started on HRT patches for vasomotor symptoms of menopause, tolerating well and feels symptoms are much better controlled Feels her anxiety has also improved since our last visit, working on learning to cope with her fears and stressors  Requesting refills on her current medications, no acute complaints or issues today  Current Medication: Outpatient Encounter Medications as of 05/25/2020  Medication Sig  . amLODipine (NORVASC) 2.5 MG tablet Take 1 tablet (2.5 mg total) by mouth daily.  . BD PEN NEEDLE NANO U/F 32G X 4 MM MISC USE AS DIRECTED WITH VICTOZA PEN  . diclofenac sodium (VOLTAREN) 1 % GEL Apply topically as needed.  Marland Kitchen estradiol (CLIMARA - DOSED IN MG/24 HR) 0.0375 mg/24hr patch Place 1 patch (0.0375 mg total) onto the skin once a week.  . loratadine (CLARITIN) 10 MG tablet Take 1 tablet (10 mg total) by mouth daily.  . metFORMIN (GLUCOPHAGE-XR) 500 MG 24 hr tablet TAKE 1 TABLET BY MOUTH EVERY DAY FOR DIABETES  . montelukast (SINGULAIR) 10 MG tablet Take 1 tablet (10 mg total) by mouth daily.  . Semaglutide,0.25 or 0.5MG /DOS, (OZEMPIC, 0.25 OR 0.5 MG/DOSE,) 2 MG/1.5ML SOPN Inject 0.5 mg into the skin once a week.  . valACYclovir (VALTREX) 1000 MG tablet Take 1  Tab by po twice a day for 5 days and take 1 tab by po daily  to prevent fever blister  . [DISCONTINUED] amLODipine (NORVASC) 2.5 MG tablet Take 1 tablet (2.5 mg total) by mouth daily.  . [DISCONTINUED] azithromycin (ZITHROMAX) 250 MG  tablet z-pack - take as directed for 5 days  . [DISCONTINUED] cyclobenzaprine (FLEXERIL) 10 MG tablet Take 1 tablet (10 mg total) by mouth at bedtime.  . [DISCONTINUED] loratadine (CLARITIN) 10 MG tablet Take 1 tablet (10 mg total) by mouth daily.  . [DISCONTINUED] metFORMIN (GLUCOPHAGE-XR) 500 MG 24 hr tablet TAKE 1 TABLET BY MOUTH EVERY DAY FOR DIABETES  . [DISCONTINUED] montelukast (SINGULAIR) 10 MG tablet Take 1 tablet (10 mg total) by mouth daily.  . [DISCONTINUED] Semaglutide,0.25 or 0.5MG /DOS, (OZEMPIC, 0.25 OR 0.5 MG/DOSE,) 2 MG/1.5ML SOPN Inject 0.5 mg into the skin once a week.   No facility-administered encounter medications on file as of 05/25/2020.    Surgical History: Past Surgical History:  Procedure Laterality Date  . CHOLECYSTECTOMY  09-26-13  . CYSTOSCOPY N/A 01/12/2017   Procedure: CYSTOSCOPY;  Surgeon: Malachy Mood, MD;  Location: [facility] ORS;  Service: Gynecology;  Laterality: N/A;  . HYSTEROSCOPY WITH D & C  11/24/2016   Procedure: DILATATION AND CURETTAGE /HYSTEROSCOPY;  Surgeon: Malachy Mood, MD;  Location: [facility] ORS;  Service: Gynecology;;  . KNEE SURGERY Right   . LEEP  2010   [facility]  . TONSILLECTOMY  2014  . TOTAL LAPAROSCOPIC HYSTERECTOMY WITH SALPINGECTOMY Right 01/12/2017   Procedure: TOTAL LAPAROSCOPIC HYSTERECTOMY WITH RIGHT SALPINGECTOMY;  Surgeon: Malachy Mood, MD;  Location: [facility] ORS;  Service: Gynecology;  Laterality: Right;  . TUBAL LIGATION  1997    Medical History: Past  Medical History:  Diagnosis Date  . Anemia    AS A CHILD  . Diabetes mellitus without complication ([place])   . Fibroids   . Hemorrhoids   . Seasonal allergies   . Sleep apnea    NO CPAP    Family History: Family History  Problem Relation Age of Onset  . Diabetes Sister   . Breast cancer Neg Hx     Social History   Socioeconomic History  . Marital status: Married    Spouse name: Not on file  . Number of children: Not on file  . Years of education: Not  on file  . Highest education level: Not on file  Occupational History  . Not on file  Tobacco Use  . Smoking status: Never Smoker  . Smokeless tobacco: Never Used  Vaping Use  . Vaping Use: Never used  Substance and Sexual Activity  . Alcohol use: No  . Drug use: No  . Sexual activity: Yes    Birth control/protection: None  Other Topics Concern  . Not on file  Social History Narrative  . Not on file   Social Determinants of Health   Financial Resource Strain: Not on file  Food Insecurity: Not on file  Transportation Needs: Not on file  Physical Activity: Not on file  Stress: Not on file  Social Connections: Not on file  Intimate Partner Violence: Not on file      Review of Systems  Constitutional: Negative for chills, diaphoresis and fatigue.  HENT: Negative for ear pain, postnasal drip and sinus pressure.   Eyes: Negative for photophobia, discharge, redness, itching and visual disturbance.  Respiratory: Negative for cough, shortness of breath and wheezing.   Cardiovascular: Negative for chest pain, palpitations and leg swelling.  Gastrointestinal: Negative for abdominal pain, constipation, diarrhea, nausea and vomiting.  Genitourinary: Negative for dysuria and flank pain.  Musculoskeletal: Negative for arthralgias, back pain, gait problem and neck pain.  Skin: Negative for color change.  Allergic/Immunologic: Negative for environmental allergies and food allergies.  Neurological: Negative for dizziness and headaches.  Hematological: Does not bruise/bleed easily.  Psychiatric/Behavioral: Negative for agitation, behavioral problems (depression) and hallucinations.    Vital Signs: BP 126/90   Pulse 93   Temp (!) 97.5 F (36.4 C)   Resp 16   Ht 5\' 4"  (1.626 m)   Wt 164 lb 3.2 oz (74.5 kg)   LMP 12/28/2016   SpO2 99%   BMI 28.18 kg/m    Physical Exam Vitals reviewed.  Constitutional:      Appearance: Normal appearance. She is normal weight.   Cardiovascular:     Rate and Rhythm: Normal rate and regular rhythm.     Pulses: Normal pulses.     Heart sounds: Normal heart sounds.  Pulmonary:     Effort: Pulmonary effort is normal.     Breath sounds: Normal breath sounds.  Abdominal:     General: Abdomen is flat.     Palpations: Abdomen is soft.  Musculoskeletal:        General: Normal range of motion.     Cervical back: Normal range of motion.  Skin:    General: Skin is warm.  Neurological:     General: No focal deficit present.     Mental Status: She is alert and oriented to person, place, and time. Mental status is at baseline.  Psychiatric:        Mood and Affect: Mood normal.        Behavior:  Behavior normal.        Thought Content: Thought content normal.        Judgment: Judgment normal.   Assessment/Plan: 1. Type 2 diabetes mellitus with hyperglycemia, without long-term current use of insulin (HCC) A1C 6.0 today, well controlled, continue current management and follow-up - POCT HgB A1C - metFORMIN (GLUCOPHAGE-XR) 500 MG 24 hr tablet; TAKE 1 TABLET BY MOUTH EVERY DAY FOR DIABETES  Dispense: 30 tablet; Refill: 3 - Semaglutide,0.25 or 0.5MG /DOS, (OZEMPIC, 0.25 OR 0.5 MG/DOSE,) 2 MG/1.5ML SOPN; Inject 0.5 mg into the skin once a week.  Dispense: 1.5 mL; Refill: 3  2. Essential hypertension BP and HR well controlled, continue monitoring, requesting refills - amLODipine (NORVASC) 2.5 MG tablet; Take 1 tablet (2.5 mg total) by mouth daily.  Dispense: 30 tablet; Refill: 3  3. Seasonal allergic rhinitis due to other allergic trigger Symptoms remain well controlled, requesting refills - loratadine (CLARITIN) 10 MG tablet; Take 1 tablet (10 mg total) by mouth daily.  Dispense: 30 tablet; Refill: 5 - montelukast (SINGULAIR) 10 MG tablet; Take 1 tablet (10 mg total) by mouth daily.  Dispense: 30 tablet; Refill: 5  4. Generalized anxiety disorder Symptoms improving, unable to tolerate Lexapro, would like to hold off on  initiating alternative therapy options as she feels she is better controlling her emotions  General Counseling: Sundy verbalizes understanding of the findings of todays visit and agrees with plan of treatment. I have discussed any further diagnostic evaluation that may be needed or ordered today. We also reviewed her medications today. she has been encouraged to call the office with any questions or concerns that should arise related to todays visit.    Orders Placed This Encounter  Procedures  . POCT HgB A1C    Meds ordered this encounter  Medications  . amLODipine (NORVASC) 2.5 MG tablet    Sig: Take 1 tablet (2.5 mg total) by mouth daily.    Dispense:  30 tablet    Refill:  3  . loratadine (CLARITIN) 10 MG tablet    Sig: Take 1 tablet (10 mg total) by mouth daily.    Dispense:  30 tablet    Refill:  5  . metFORMIN (GLUCOPHAGE-XR) 500 MG 24 hr tablet    Sig: TAKE 1 TABLET BY MOUTH EVERY DAY FOR DIABETES    Dispense:  30 tablet    Refill:  3  . montelukast (SINGULAIR) 10 MG tablet    Sig: Take 1 tablet (10 mg total) by mouth daily.    Dispense:  30 tablet    Refill:  5  . Semaglutide,0.25 or 0.5MG /DOS, (OZEMPIC, 0.25 OR 0.5 MG/DOSE,) 2 MG/1.5ML SOPN    Sig: Inject 0.5 mg into the skin once a week.    Dispense:  1.5 mL    Refill:  3    Time spent: 30 Minutes Time spent includes review of chart, medications, test results and follow-up plan with the patient.  This patient was seen by Theodoro Grist AGNP-C in Collaboration with Dr Lavera Guise as a part of collaborative care agreement     Tanna Furry. Keishawn Darsey AGNP-C Internal medicine

## 2020-05-26 ENCOUNTER — Encounter: Payer: Self-pay | Admitting: Hospice and Palliative Medicine

## 2020-06-22 ENCOUNTER — Telehealth: Payer: Self-pay

## 2020-06-23 ENCOUNTER — Ambulatory Visit (INDEPENDENT_AMBULATORY_CARE_PROVIDER_SITE_OTHER): Payer: BC Managed Care – PPO | Admitting: Hospice and Palliative Medicine

## 2020-06-23 ENCOUNTER — Ambulatory Visit
Admission: RE | Admit: 2020-06-23 | Discharge: 2020-06-23 | Disposition: A | Discharge status: Home / Self Care | Payer: BC Managed Care – PPO | Source: Ambulatory Visit | Attending: Hospice and Palliative Medicine | Admitting: Hospice and Palliative Medicine

## 2020-06-23 ENCOUNTER — Encounter: Payer: Self-pay | Admitting: Hospice and Palliative Medicine

## 2020-06-23 ENCOUNTER — Telehealth: Payer: Self-pay

## 2020-06-23 ENCOUNTER — Other Ambulatory Visit: Payer: Self-pay

## 2020-06-23 VITALS — BP 132/86 | HR 75 | Temp 97.2°F | Resp 16 | Ht 64.0 in | Wt 171.6 lb

## 2020-06-23 DIAGNOSIS — S90862A Insect bite (nonvenomous), left foot, initial encounter: Secondary | ICD-10-CM

## 2020-06-23 DIAGNOSIS — R600 Localized edema: Secondary | ICD-10-CM | POA: Diagnosis present

## 2020-06-23 DIAGNOSIS — W57XXXA Bitten or stung by nonvenomous insect and other nonvenomous arthropods, initial encounter: Secondary | ICD-10-CM | POA: Diagnosis not present

## 2020-06-23 MED ORDER — DOXYCYCLINE HYCLATE 100 MG PO TABS: 100.0000 mg | ORAL_TABLET | Freq: Two times a day (BID) | ORAL | 0 refills | Status: DC

## 2020-06-23 NOTE — Telephone Encounter (Signed)
Called pt and informed her that the DVT scan was negative for blood clots.

## 2020-06-23 NOTE — Telephone Encounter (Signed)
Pt is being sent for DVT testing and will decide on changing medication once pt has testing per Lovena Le

## 2020-06-23 NOTE — Progress Notes (Signed)
[facility] [place], [place] [address]  Internal MEDICINE  Office Visit Note  Patient Name: Mandy Shea  [id number]  [id number]  Date of Service: 06/30/2020  Chief Complaint  Patient presents with  . Acute Visit    Possible spider bite, foot swelling and redness on outside of left foot  . Diabetes  . Sleep Apnea  . Anemia     HPI Pt is here for a sick visit. Over the weekend went to have a pedicure and noticed a spider crawling on her which then possibly bit her on left foot Over the weekend she noticed swelling and soreness to her left ankle No evidence of spider bite Has been wearing compression sock to help with swelling with has improved symptoms Denies pain at rest or with ambulation   Current Medication:  Outpatient Encounter Medications as of 06/23/2020  Medication Sig  . amLODipine (NORVASC) 2.5 MG tablet Take 1 tablet (2.5 mg total) by mouth daily.  . diclofenac sodium (VOLTAREN) 1 % GEL Apply topically as needed.  . doxycycline (VIBRA-TABS) 100 MG tablet Take 1 tablet (100 mg total) by mouth 2 (two) times daily.  Marland Kitchen loratadine (CLARITIN) 10 MG tablet Take 1 tablet (10 mg total) by mouth daily.  . metFORMIN (GLUCOPHAGE-XR) 500 MG 24 hr tablet TAKE 1 TABLET BY MOUTH EVERY DAY FOR DIABETES  . montelukast (SINGULAIR) 10 MG tablet Take 1 tablet (10 mg total) by mouth daily.  . Semaglutide,0.25 or 0.5MG /DOS, (OZEMPIC, 0.25 OR 0.5 MG/DOSE,) 2 MG/1.5ML SOPN Inject 0.5 mg into the skin once a week.  . valACYclovir (VALTREX) 1000 MG tablet Take 1  Tab by po twice a day for 5 days and take 1 tab by po daily  to prevent fever blister  . [DISCONTINUED] estradiol (CLIMARA - DOSED IN MG/24 HR) 0.0375 mg/24hr patch Place 1 patch (0.0375 mg total) onto the skin once a week.  . [DISCONTINUED] BD PEN NEEDLE NANO U/F 32G X 4 MM MISC USE AS DIRECTED WITH VICTOZA PEN (Patient not taking: Reported on 06/23/2020)   No facility-administered encounter  medications on file as of 06/23/2020.      Medical History: Past Medical History:  Diagnosis Date  . Anemia    AS A CHILD  . Diabetes mellitus without complication ([place])   . Fibroids   . Hemorrhoids   . Seasonal allergies   . Sleep apnea    NO CPAP     Vital Signs: BP 132/86   Pulse 75   Temp (!) 97.2 F (36.2 C)   Resp 16   Ht 5\' 4"  (1.626 m)   Wt 171 lb 9.6 oz (77.8 kg)   LMP 12/28/2016   SpO2 99%   BMI 29.46 kg/m    Review of Systems  Constitutional: Negative for chills, diaphoresis and fatigue.  HENT: Negative for ear pain, postnasal drip and sinus pressure.   Eyes: Negative for photophobia, discharge, redness, itching and visual disturbance.  Respiratory: Negative for cough, shortness of breath and wheezing.   Cardiovascular: Negative for chest pain, palpitations and leg swelling.  Gastrointestinal: Negative for abdominal pain, constipation, diarrhea, nausea and vomiting.  Genitourinary: Negative for dysuria and flank pain.  Musculoskeletal: Negative for arthralgias, back pain, gait problem and neck pain.  Skin: Negative for color change.       Left foot swelling, redness and tenderness  Allergic/Immunologic: Negative for environmental allergies and food allergies.  Neurological: Negative for dizziness and headaches.  Hematological: Does not bruise/bleed easily.  Psychiatric/Behavioral: Negative for agitation, behavioral problems (depression) and hallucinations.    Physical Exam Vitals reviewed.  Constitutional:      Appearance: Normal appearance. She is normal weight.  Cardiovascular:     Rate and Rhythm: Normal rate and regular rhythm.     Pulses: Normal pulses.     Heart sounds: Normal heart sounds.  Pulmonary:     Effort: Pulmonary effort is normal.     Breath sounds: Normal breath sounds.  Musculoskeletal:        General: Normal range of motion.     Cervical back: Normal range of motion.  Skin:    Comments: Lateral left foot and ankle mild  swelling, skin discoloration No tenderness to touch, warmth or evidence of insect bite  Neurological:     General: No focal deficit present.     Mental Status: She is alert and oriented to person, place, and time. Mental status is at baseline.  Psychiatric:        Mood and Affect: Mood normal.        Behavior: Behavior normal.        Thought Content: Thought content normal.        Judgment: Judgment normal.    Assessment/Plan: 1. Lower extremity edema [place] to rule out DVT - VAS [place] LOWER EXTREMITY VENOUS (DVT); Future - US Venous Img Lower Unilateral Left; Future  2. Insect bite of left foot, initial encounter Treat with prophylactic doxycycline for possible spider bite to left foot Advised to monitor area and contact office if area of discoloration, swelling or pain worsens - doxycycline (VIBRA-TABS) 100 MG tablet; Take 1 tablet (100 mg total) by mouth 2 (two) times daily.  Dispense: 20 tablet; Refill: 0  General Counseling: [name] verbalizes understanding of the findings of todays visit and agrees with plan of treatment. I have discussed any further diagnostic evaluation that may be needed or ordered today. We also reviewed her medications today. she has been encouraged to call the office with any questions or concerns that should arise related to todays visit.   Orders Placed This Encounter  Procedures  . US Venous Img Lower Unilateral Left  . VAS [place] LOWER EXTREMITY VENOUS (DVT)    Meds ordered this encounter  Medications  . doxycycline (VIBRA-TABS) 100 MG tablet    Sig: Take 1 tablet (100 mg total) by mouth 2 (two) times daily.    Dispense:  20 tablet    Refill:  0    Time spent: 25 Minutes  This patient was seen by Theodoro Grist AGNP-C in Collaboration with Dr Lavera Guise as a part of collaborative care agreement.  Tanna Furry [facility]

## 2020-06-24 ENCOUNTER — Other Ambulatory Visit: Payer: Self-pay

## 2020-06-25 ENCOUNTER — Other Ambulatory Visit: Payer: Self-pay

## 2020-06-25 MED ORDER — ESTRADIOL 0.5 MG PO TABS: ORAL_TABLET | ORAL | 1 refills | Status: DC

## 2020-06-26 ENCOUNTER — Telehealth: Payer: Self-pay

## 2020-06-26 NOTE — Telephone Encounter (Signed)
Pt called requesting to find out what to do about the swelling in her feet.  Per Lovena Le pt is to finish antibiotic, and use compression socks and after finishing ABX if no better we will figure what needs to be done next

## 2020-06-29 ENCOUNTER — Other Ambulatory Visit: Payer: Self-pay

## 2020-06-29 ENCOUNTER — Telehealth: Payer: Self-pay

## 2020-06-29 MED ORDER — FLUCONAZOLE 150 MG PO TABS: ORAL_TABLET | ORAL | 0 refills | Status: DC

## 2020-06-29 NOTE — Telephone Encounter (Signed)
Pt called that she had doxycycline given on last visit and she is having yeast infection as per taylor send diflucan

## 2020-06-30 ENCOUNTER — Encounter: Payer: Self-pay | Admitting: Hospice and Palliative Medicine

## 2020-07-08 ENCOUNTER — Ambulatory Visit: Payer: BC Managed Care – PPO | Admitting: Physician Assistant

## 2020-07-20 ENCOUNTER — Telehealth: Payer: Self-pay

## 2020-07-20 NOTE — Telephone Encounter (Signed)
Pt called stating that she was constipated and has not had a bowel movement in 6 days.  She has taken mirlax and used suppositories and still hasn't helped.  Pt advised she was in pain at her rectum.  Per Ander Purpura pt was advised to go to ER.

## 2020-08-12 ENCOUNTER — Other Ambulatory Visit: Payer: Self-pay

## 2020-08-12 MED ORDER — FLUCONAZOLE 150 MG PO TABS: ORAL_TABLET | ORAL | 0 refills | Status: DC

## 2020-08-12 NOTE — Telephone Encounter (Signed)
Pt called that she having yeast infection as per dr Humphrey Rolls we send diflucan 150 1 tab po daily  for 5 days and pt advised discuss with provider on next visit for if continue having yeast infections

## 2020-09-01 ENCOUNTER — Encounter: Payer: Self-pay | Admitting: Nurse Practitioner

## 2020-09-01 ENCOUNTER — Other Ambulatory Visit: Payer: Self-pay

## 2020-09-01 ENCOUNTER — Ambulatory Visit (INDEPENDENT_AMBULATORY_CARE_PROVIDER_SITE_OTHER): Payer: BC Managed Care – PPO | Admitting: Nurse Practitioner

## 2020-09-01 VITALS — BP 118/82 | HR 80 | Temp 98.3°F | Ht 64.0 in | Wt 172.4 lb

## 2020-09-01 DIAGNOSIS — Z6829 Body mass index (BMI) 29.0-29.9, adult: Secondary | ICD-10-CM

## 2020-09-01 DIAGNOSIS — E1165 Type 2 diabetes mellitus with hyperglycemia: Secondary | ICD-10-CM

## 2020-09-01 DIAGNOSIS — N951 Menopausal and female climacteric states: Secondary | ICD-10-CM

## 2020-09-01 DIAGNOSIS — F411 Generalized anxiety disorder: Secondary | ICD-10-CM

## 2020-09-01 LAB — POCT GLYCOSYLATED HEMOGLOBIN (HGB A1C): Hemoglobin A1C: 6.1 % — AB (ref 4.0–5.6)

## 2020-09-01 MED ORDER — SEMAGLUTIDE (1 MG/DOSE) 4 MG/3ML ~~LOC~~ SOPN: 1.0000 mg | PEN_INJECTOR | SUBCUTANEOUS | 2 refills | Status: DC

## 2020-09-01 MED ORDER — VENLAFAXINE HCL ER 37.5 MG PO CP24: 37.5000 mg | ORAL_CAPSULE | Freq: Every day | ORAL | 0 refills | Status: DC

## 2020-09-01 NOTE — Progress Notes (Signed)
[facility] [place], [place] [address]  Internal MEDICINE  Office Visit Note  Patient Name: Mandy Shea  [id number]  [id number]  Date of Service: 09/01/2020  Chief Complaint  Patient presents with   Follow-up    Med review   Diabetes    A1C   Hypertension    HPI Mandy Shea presents for a follow up visit to review her medications, check her A1C and discuss hypertension and diabetes. She has also been having recurrent yeast infections since she started taking estrace to help with her vasomotor symptoms of menopause. She was started on oral estradiol by her previous PCP but had not been tried on other medications for her menopausal symptoms prior to starting HRT. She reports having frequent yeast infections as well as having increased white vaginal discharge to the point that she has to wear a pad sometimes. Yeast infections and leukorrhea are common side effects of oral estradiol.   -A1c is 6.1 today, which is up from 6.0 earlier in the year. She is currently taking ozempic 05 mg weekly and extended release metformin 500 mg daily.  -blood pressure is under control, 118/82. She is not currently taking any medications for hypertension.     Current Medication: Outpatient Encounter Medications as of 09/01/2020  Medication Sig   loratadine (CLARITIN) 10 MG tablet Take 1 tablet (10 mg total) by mouth daily.   metFORMIN (GLUCOPHAGE-XR) 500 MG 24 hr tablet TAKE 1 TABLET BY MOUTH EVERY DAY FOR DIABETES   montelukast (SINGULAIR) 10 MG tablet Take 1 tablet (10 mg total) by mouth daily.   Semaglutide, 1 MG/DOSE, 4 MG/3ML SOPN Inject 1 mg into the skin once a week.   venlafaxine XR (EFFEXOR-XR) 37.5 MG 24 hr capsule Take 1 capsule (37.5 mg total) by mouth daily with breakfast. Take 1 capsule daily x7 days then increase to 2 capsules daily.   [DISCONTINUED] estradiol (ESTRACE) 0.5 MG tablet Take 1 tablet by mouth daily for 5 days, skip one day and then continue same dose    [DISCONTINUED] Semaglutide,0.25 or 0.5MG /DOS, (OZEMPIC, 0.25 OR 0.5 MG/DOSE,) 2 MG/1.5ML SOPN Inject 0.5 mg into the skin once a week.   diclofenac sodium (VOLTAREN) 1 % GEL Apply topically as needed. (Patient not taking: Reported on 09/01/2020)   [DISCONTINUED] amLODipine (NORVASC) 2.5 MG tablet Take 1 tablet (2.5 mg total) by mouth daily. (Patient not taking: Reported on 09/01/2020)   [DISCONTINUED] doxycycline (VIBRA-TABS) 100 MG tablet Take 1 tablet (100 mg total) by mouth 2 (two) times daily. (Patient not taking: Reported on 09/01/2020)   [DISCONTINUED] fluconazole (DIFLUCAN) 150 MG tablet Take 1 tab po daily for 5 days (Patient not taking: Reported on 09/01/2020)   [DISCONTINUED] valACYclovir (VALTREX) 1000 MG tablet Take 1  Tab by po twice a day for 5 days and take 1 tab by po daily  to prevent fever blister (Patient not taking: Reported on 09/01/2020)   No facility-administered encounter medications on file as of 09/01/2020.    Surgical History: Past Surgical History:  Procedure Laterality Date   CHOLECYSTECTOMY  09-26-13   CYSTOSCOPY N/A 01/12/2017   Procedure: CYSTOSCOPY;  Surgeon: Malachy Mood, MD;  Location: [facility] ORS;  Service: Gynecology;  Laterality: N/A;   HYSTEROSCOPY WITH D & C  11/24/2016   Procedure: DILATATION AND CURETTAGE /HYSTEROSCOPY;  Surgeon: Malachy Mood, MD;  Location: [facility] ORS;  Service: Gynecology;;   KNEE SURGERY Right    LEEP  2010   [facility]   TONSILLECTOMY  2014  TOTAL LAPAROSCOPIC HYSTERECTOMY WITH SALPINGECTOMY Right 01/12/2017   Procedure: TOTAL LAPAROSCOPIC HYSTERECTOMY WITH RIGHT SALPINGECTOMY;  Surgeon: Malachy Mood, MD;  Location: [facility] ORS;  Service: Gynecology;  Laterality: Right;   TUBAL LIGATION  1997    Medical History: Past Medical History:  Diagnosis Date   Anemia    AS A CHILD   Diabetes mellitus without complication (HCC)    Fibroids    Hemorrhoids    Seasonal allergies    Sleep apnea    NO CPAP    Family History: Family  History  Problem Relation Age of Onset   Diabetes Sister    Breast cancer Neg Hx     Social History   Socioeconomic History   Marital status: Married    Spouse name: Not on file   Number of children: Not on file   Years of education: Not on file   Highest education level: Not on file  Occupational History   Not on file  Tobacco Use   Smoking status: Never   Smokeless tobacco: Never  Vaping Use   Vaping Use: Never used  Substance and Sexual Activity   Alcohol use: No   Drug use: No   Sexual activity: Yes    Birth control/protection: None  Other Topics Concern   Not on file  Social History Narrative   Not on file   Social Determinants of Health   Financial Resource Strain: Not on file  Food Insecurity: Not on file  Transportation Needs: Not on file  Physical Activity: Not on file  Stress: Not on file  Social Connections: Not on file  Intimate Partner Violence: Not on file      Review of Systems  Constitutional:  Positive for unexpected weight change (weight gain,). Negative for appetite change, chills, fatigue and fever.  HENT:  Negative for congestion, rhinorrhea, sneezing and sore throat.   Eyes:  Negative for redness.  Respiratory: Negative.  Negative for cough, chest tightness, shortness of breath and wheezing.   Cardiovascular: Negative.  Negative for chest pain and palpitations.  Gastrointestinal:  Negative for abdominal pain, constipation, diarrhea, nausea and vomiting.  Genitourinary:  Negative for dysuria and frequency.  Musculoskeletal:  Negative for arthralgias, back pain, joint swelling and neck pain.  Skin:  Negative for rash.  Neurological: Negative.  Negative for tremors and numbness.  Hematological:  Negative for adenopathy. Does not bruise/bleed easily.  Psychiatric/Behavioral:  Negative for behavioral problems (Depression), sleep disturbance and suicidal ideas. The patient is not nervous/anxious.    Vital Signs: BP 118/82   Pulse 80   Temp  98.3 F (36.8 C)   Ht 5\' 4"  (1.626 m)   Wt 172 lb 6.4 oz (78.2 kg)   LMP 12/28/2016   HC 16" (40.6 cm)   SpO2 94%   BMI 29.59 kg/m    Physical Exam Vitals reviewed.  Constitutional:      General: She is not in acute distress.    Appearance: She is well-developed. She is not diaphoretic.  HENT:     Head: Normocephalic and atraumatic.  Neck:     Thyroid: No thyromegaly.     Vascular: No JVD.     Trachea: No tracheal deviation.  Cardiovascular:     Rate and Rhythm: Normal rate and regular rhythm.     Heart sounds: Normal heart sounds. No murmur heard.   No friction rub. No gallop.  Pulmonary:     Effort: Pulmonary effort is normal. No respiratory distress.     Breath  sounds: No wheezing or rales.  Chest:     Chest wall: No tenderness.  Abdominal:     General: Bowel sounds are normal.     Palpations: Abdomen is soft.  Musculoskeletal:     Cervical back: Normal range of motion and neck supple.  Lymphadenopathy:     Cervical: No cervical adenopathy.  Skin:    General: Skin is warm and dry.     Capillary Refill: Capillary refill takes less than 2 seconds.  Neurological:     Mental Status: She is alert and oriented to person, place, and time.     Cranial Nerves: No cranial nerve deficit.  Psychiatric:        Mood and Affect: Mood normal.        Behavior: Behavior normal.     Assessment/Plan: 1. Type 2 diabetes mellitus with hyperglycemia, without long-term current use of insulin (HCC) A1C 6.1 today, ozempic dose increased to 1 mg weekly to help keep A1C down and aid in weight loss.  - POCT glycosylated hemoglobin (Hb A1C) - Semaglutide, 1 MG/DOSE, 4 MG/3ML SOPN; Inject 1 mg into the skin once a week.  Dispense: 6 mL; Refill: 2  2. Vasomotor symptoms due to menopause Estradiol stopped, venlafaxine prescribed   3. Generalized anxiety disorder Venlafaxine prescribed for anxiety and vasomotor symptoms of menopause.                                            -  venlafaxine XR (EFFEXOR-XR) 37.5 MG 24 hr capsule; Take 1 capsule (37.5 mg total) by mouth daily with breakfast. Take 1 capsule daily x7 days then increase to 2 capsules daily.  Dispense: 53 capsule; Refill: 0  4. Body mass index (BMI) of 29.0 to 29.9 in adult She is due for her CPE. When she comes in for this next office visit, will have her come in 30 minutes earlier for the metabolic test. She desires assistance with weight loss so we will discuss this at her next office visit with her health maintenance exam.   General Counseling: Poonam verbalizes understanding of the findings of todays visit and agrees with plan of treatment. I have discussed any further diagnostic evaluation that may be needed or ordered today. We also reviewed her medications today. she has been encouraged to call the office with any questions or concerns that should arise related to todays visit.    Orders Placed This Encounter  Procedures   POCT glycosylated hemoglobin (Hb A1C)    Meds ordered this encounter  Medications   venlafaxine XR (EFFEXOR-XR) 37.5 MG 24 hr capsule    Sig: Take 1 capsule (37.5 mg total) by mouth daily with breakfast. Take 1 capsule daily x7 days then increase to 2 capsules daily.    Dispense:  53 capsule    Refill:  0   Semaglutide, 1 MG/DOSE, 4 MG/3ML SOPN    Sig: Inject 1 mg into the skin once a week.    Dispense:  6 mL    Refill:  2    Return in about 6 weeks (around 10/12/2020) for CPE, Nichola Cieslinski PCP previously scheduled, please come in 30 minutes early for metabolic test. .   Total time spent:30 Minutes Time spent includes review of chart, medications, test results, and follow up plan with the patient.   [place] Controlled Substance Database was reviewed by me.  This patient  was seen by Jonetta Osgood, FNP-C in collaboration with Dr. Clayborn Bigness as a part of collaborative care agreement.   Soledad Budreau R. Valetta Fuller, MSN, FNP-C Internal medicine

## 2020-10-02 ENCOUNTER — Other Ambulatory Visit: Payer: Self-pay | Admitting: Internal Medicine

## 2020-10-02 ENCOUNTER — Other Ambulatory Visit: Payer: Self-pay | Admitting: Nurse Practitioner

## 2020-10-02 DIAGNOSIS — F411 Generalized anxiety disorder: Secondary | ICD-10-CM

## 2020-10-02 DIAGNOSIS — Z1231 Encounter for screening mammogram for malignant neoplasm of breast: Secondary | ICD-10-CM

## 2020-10-02 MED ORDER — VENLAFAXINE HCL ER 75 MG PO CP24: 75.0000 mg | ORAL_CAPSULE | Freq: Every day | ORAL | 1 refills | Status: DC

## 2020-10-12 ENCOUNTER — Encounter: Payer: Self-pay | Admitting: Nurse Practitioner

## 2020-10-12 ENCOUNTER — Other Ambulatory Visit: Payer: Self-pay

## 2020-10-12 ENCOUNTER — Ambulatory Visit (INDEPENDENT_AMBULATORY_CARE_PROVIDER_SITE_OTHER): Payer: BC Managed Care – PPO | Admitting: Nurse Practitioner

## 2020-10-12 VITALS — BP 130/86 | HR 90 | Temp 97.2°F | Resp 16 | Ht 64.0 in | Wt 171.0 lb

## 2020-10-12 DIAGNOSIS — Z1212 Encounter for screening for malignant neoplasm of rectum: Secondary | ICD-10-CM

## 2020-10-12 DIAGNOSIS — N951 Menopausal and female climacteric states: Secondary | ICD-10-CM | POA: Diagnosis not present

## 2020-10-12 DIAGNOSIS — R300 Dysuria: Secondary | ICD-10-CM

## 2020-10-12 DIAGNOSIS — I10 Essential (primary) hypertension: Secondary | ICD-10-CM

## 2020-10-12 DIAGNOSIS — Z0001 Encounter for general adult medical examination with abnormal findings: Secondary | ICD-10-CM

## 2020-10-12 DIAGNOSIS — Z6829 Body mass index (BMI) 29.0-29.9, adult: Secondary | ICD-10-CM | POA: Diagnosis not present

## 2020-10-12 DIAGNOSIS — Z1211 Encounter for screening for malignant neoplasm of colon: Secondary | ICD-10-CM

## 2020-10-12 DIAGNOSIS — E1165 Type 2 diabetes mellitus with hyperglycemia: Secondary | ICD-10-CM | POA: Diagnosis not present

## 2020-10-12 LAB — POCT UA - MICROALBUMIN
Albumin/Creatinine Ratio, Urine, POC: <30
Creatinine, POC: 200 mg/dL
Microalbumin Ur, POC: 10 mg/L

## 2020-10-12 NOTE — Progress Notes (Signed)
[facility] [place], [address] [address]  Internal MEDICINE  Office Visit Note  Patient Name: Mandy Shea  [id number]  [id number]  Date of Service: 10/12/2020  Chief Complaint  Patient presents with   Annual Exam   Diabetes   Sleep Apnea   Anemia    HPI Mandy Shea presents for an annual well visit and physical exam. she has a history of anemia, diabetes, sleep apnea, seasonal allergies, does not use CPAP, hysterectomy, cholecystectomy.  -age appropriate screenings and immunizations as appropriateShe has received 2 doses of the COVID vaccine and 1 booster dose. She is postmenopausal and still has vasomotor symptoms. At her previous office visit, she was started on venlafaxine. The oral estradiol pill was discontinued at the previous office visit. The yeast infections and leukorrhea have resolved. Her hot flashes and night sweats have significantly improved.  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  She denies any pain, She has lost 1 lb since her previous office visit. She is frustrated with her weight and would like assistance with weight loss management. Discussed current diet which is appropriate, patient did metabolic test in office today to determine appropriate daily caloric intake to help her with weight loss as well as assess whether her current metabolic rate is slow, normal or fast.  Her mammogram is scheduled for 11/06/20.  -blood pressure is currently controlled without medication.    Current Medication: Outpatient Encounter Medications as of 10/12/2020  Medication Sig   loratadine (CLARITIN) 10 MG tablet Take 1 tablet (10 mg total) by mouth daily.   metFORMIN (GLUCOPHAGE-XR) 500 MG 24 hr tablet TAKE 1 TABLET BY MOUTH EVERY DAY FOR DIABETES   montelukast (SINGULAIR) 10 MG tablet Take 1 tablet (10 mg total) by mouth daily.   Semaglutide, 1 MG/DOSE, 4 MG/3ML SOPN Inject 1 mg into the skin once a week.    venlafaxine XR (EFFEXOR XR) 75 MG 24 hr capsule Take 1 capsule (75 mg total) by mouth daily with breakfast.   [DISCONTINUED] diclofenac sodium (VOLTAREN) 1 % GEL Apply topically as needed. (Patient not taking: No sig reported)   No facility-administered encounter medications on file as of 10/12/2020.    Surgical History: Past Surgical History:  Procedure Laterality Date   CHOLECYSTECTOMY  09-26-13   CYSTOSCOPY N/A 01/12/2017   Procedure: CYSTOSCOPY;  Surgeon: Malachy Mood, MD;  Location: [facility] ORS;  Service: Gynecology;  Laterality: N/A;   HYSTEROSCOPY WITH D & C  11/24/2016   Procedure: DILATATION AND CURETTAGE /HYSTEROSCOPY;  Surgeon: Malachy Mood, MD;  Location: [facility] ORS;  Service: Gynecology;;   KNEE SURGERY Right    LEEP  2010   [facility]   TONSILLECTOMY  2014   TOTAL LAPAROSCOPIC HYSTERECTOMY WITH SALPINGECTOMY Right 01/12/2017   Procedure: TOTAL LAPAROSCOPIC HYSTERECTOMY WITH RIGHT SALPINGECTOMY;  Surgeon: Malachy Mood, MD;  Location: [facility] ORS;  Service: Gynecology;  Laterality: Right;   TUBAL LIGATION  1997    Medical History: Past Medical History:  Diagnosis Date   Anemia    AS A CHILD   Diabetes mellitus without complication (HCC)    Fibroids    Hemorrhoids    Seasonal allergies    Sleep apnea    NO CPAP    Family History: Family History  Problem Relation Age of Onset   Diabetes Sister    Breast cancer Neg Hx     Social History   Socioeconomic History   Marital status: Married  Spouse name: Not on file   Number of children: Not on file   Years of education: Not on file   Highest education level: Not on file  Occupational History   Not on file  Tobacco Use   Smoking status: Never   Smokeless tobacco: Never  Vaping Use   Vaping Use: Never used  Substance and Sexual Activity   Alcohol use: No   Drug use: No   Sexual activity: Yes    Birth control/protection: None  Other Topics Concern   Not on file  Social History Narrative   Not  on file   Social Determinants of Health   Financial Resource Strain: Not on file  Food Insecurity: Not on file  Transportation Needs: Not on file  Physical Activity: Not on file  Stress: Not on file  Social Connections: Not on file  Intimate Partner Violence: Not on file      Review of Systems  Constitutional:  Negative for activity change, appetite change, chills, fatigue, fever and unexpected weight change.  HENT: Negative.  Negative for congestion, ear pain, rhinorrhea, sore throat and trouble swallowing.   Eyes: Negative.   Respiratory: Negative.  Negative for cough, chest tightness, shortness of breath and wheezing.   Cardiovascular: Negative.  Negative for chest pain.  Gastrointestinal: Negative.  Negative for abdominal pain, blood in stool, constipation, diarrhea, nausea and vomiting.  Endocrine: Negative.   Genitourinary: Negative.  Negative for difficulty urinating, dysuria, frequency, hematuria and urgency.  Musculoskeletal: Negative.  Negative for arthralgias, back pain, joint swelling, myalgias and neck pain.  Skin: Negative.  Negative for rash and wound.  Allergic/Immunologic: Negative.  Negative for immunocompromised state.  Neurological: Negative.  Negative for dizziness, seizures, numbness and headaches.  Hematological: Negative.   Psychiatric/Behavioral: Negative.  Negative for behavioral problems, self-injury and suicidal ideas. The patient is not nervous/anxious.    Vital Signs: BP 130/86 Comment: 126/94  Pulse 90   Temp (!) 97.2 F (36.2 C)   Resp 16   Ht '5\' 4"'$  (1.626 m)   Wt 171 lb (77.6 kg)   LMP 12/28/2016   SpO2 97%   BMI 29.35 kg/m    Physical Exam Vitals reviewed.  Constitutional:      General: She is awake. She is not in acute distress.    Appearance: Normal appearance. She is well-developed, well-groomed and overweight. She is not diaphoretic.  HENT:     Head: Normocephalic and atraumatic.     Right Ear: Hearing, tympanic membrane, ear  canal and external ear normal.     Left Ear: Hearing, tympanic membrane, ear canal and external ear normal.     Nose: Nose normal. No congestion or rhinorrhea.     Mouth/Throat:     Lips: Pink.     Mouth: Mucous membranes are moist.     Pharynx: Oropharynx is clear. No oropharyngeal exudate or posterior oropharyngeal erythema.  Eyes:     General: Lids are normal. Vision grossly intact. Gaze aligned appropriately. No scleral icterus.       Right eye: No discharge.        Left eye: No discharge.     Extraocular Movements: Extraocular movements intact.     Conjunctiva/sclera: Conjunctivae normal.     Pupils: Pupils are equal, round, and reactive to light.     Funduscopic exam:    Right eye: Red reflex present.        Left eye: Red reflex present. Neck:     Thyroid: No thyromegaly.  Vascular: No JVD.     Trachea: Trachea and phonation normal. No tracheal deviation.  Cardiovascular:     Rate and Rhythm: Normal rate and regular rhythm.     Pulses:          Carotid pulses are 3+ on the right side and 3+ on the left side.      Radial pulses are 2+ on the right side and 2+ on the left side.       Dorsalis pedis pulses are 2+ on the right side and 2+ on the left side.       Posterior tibial pulses are 2+ on the right side and 2+ on the left side.     Heart sounds: Normal heart sounds, S1 normal and S2 normal. No murmur heard.   No friction rub. No gallop.  Pulmonary:     Effort: Pulmonary effort is normal. No accessory muscle usage or respiratory distress.     Breath sounds: Normal breath sounds. No stridor. No wheezing or rales.  Chest:     Chest wall: No tenderness.  Abdominal:     General: Bowel sounds are normal. There is no distension.     Palpations: Abdomen is soft. There is no shifting dullness, fluid wave, mass or pulsatile mass.     Tenderness: There is no abdominal tenderness. There is no guarding or rebound.  Musculoskeletal:        General: No tenderness or deformity.  Normal range of motion.     Cervical back: Normal range of motion and neck supple.     Right lower leg: No edema.     Left lower leg: No edema.     Right foot: Normal range of motion. No deformity, bunion, Charcot foot, foot drop or prominent metatarsal heads.     Left foot: Normal range of motion. No deformity, bunion, Charcot foot, foot drop or prominent metatarsal heads.  Feet:     Right foot:     Protective Sensation: 6 sites tested.  6 sites sensed.     Skin integrity: Skin integrity normal. No skin breakdown, callus, dry skin or fissure.     Toenail Condition: Right toenails are normal.     Left foot:     Protective Sensation: 6 sites tested.  6 sites sensed.     Skin integrity: Skin integrity normal. No skin breakdown, callus, dry skin or fissure.     Toenail Condition: Left toenails are normal.  Lymphadenopathy:     Cervical: No cervical adenopathy.  Skin:    General: Skin is warm and dry.     Capillary Refill: Capillary refill takes less than 2 seconds.     Coloration: Skin is not pale.     Findings: No erythema or rash.  Neurological:     Mental Status: She is alert and oriented to person, place, and time.     Cranial Nerves: No cranial nerve deficit.     Motor: No abnormal muscle tone.     Coordination: Coordination normal.     Deep Tendon Reflexes: Reflexes are normal and symmetric.  Psychiatric:        Mood and Affect: Mood and affect normal.        Behavior: Behavior normal. Behavior is cooperative.        Thought Content: Thought content normal.        Judgment: Judgment normal.     Assessment/Plan: 1. Encounter for general adult medical examination with abnormal findings Age-appropriate preventive screenings and vaccinations  discussed, annual physical exam completed. Routine labs for health maintenance will be ordered prior to next office visit. PHM updated. Due for colorectal cancer screening. Mammogram is scheduled for 11/06/20. Diabetic foot exam done today.  Diabetic eye exam is due in November, patient states she will schedule it. Patient declined all recommended vaccinations for now.   2. Essential hypertension Not currently on any medications for hypertension, will continue to monitor and discuss medications if her blood pressure is consistently elevated above 140/90. Encouraged patient to check her BP at home 2 or 3 times per week.   3. Type 2 diabetes mellitus with hyperglycemia, without long-term current use of insulin ([place]) Most recent A1C is 6.1 in July. Will recheck in October. Urine microalbumin was normal. Ozempic dose was increased at her last office visit to help with her diabetes as well as weight loss. If next A1C is under 6, will stop metformin.  - POCT UA - Microalbumin  4. Vasomotor symptoms due to menopause Venlafaxine is still working well per patient, continue as prescribed.   5. BMI 29.0-29.9,adult Target calorie intake for weight loss 1142-1426 daily. Metabolism is considered normal. Provided with sample meal plan for 1200 calorie diet.  - Metabolic Test  6. Screening for colorectal cancer Referral for GI ordered per patient request, reports never has done colonoscopy or cologard.  - Ambulatory referral to Gastroenterology  7. Dysuria Routine urinalysis done - UA/M w/rflx Culture, Routine - Microscopic Examination - Urine Culture, Reflex      General Counseling: Rhyan verbalizes understanding of the findings of todays visit and agrees with plan of treatment. I have discussed any further diagnostic evaluation that may be needed or ordered today. We also reviewed her medications today. she has been encouraged to call the office with any questions or concerns that should arise related to todays visit.    Orders Placed This Encounter  Procedures   Metabolic Test   Microscopic Examination   Urine Culture, Reflex   UA/M w/rflx Culture, Routine   Ambulatory referral to Gastroenterology   POCT UA - Microalbumin     No orders of the defined types were placed in this encounter.   No follow-ups on file.   Total time spent:30 Minutes Time spent includes review of chart, medications, test results, and follow up plan with the patient.   [place] Controlled Substance Database was reviewed by me.  This patient was seen by Jonetta Osgood, FNP-C in collaboration with Dr. Clayborn Bigness as a part of collaborative care agreement.  Naseer Hearn R. Valetta Fuller, MSN, FNP-C Internal medicine

## 2020-10-15 LAB — UA/M W/RFLX CULTURE, ROUTINE
Bilirubin, UA: NEGATIVE
Glucose, UA: NEGATIVE
Leukocytes,UA: NEGATIVE
Nitrite, UA: NEGATIVE
RBC, UA: NEGATIVE
Specific Gravity, UA: 1.029 (ref 1.005–1.030)
Urobilinogen, Ur: 1 mg/dL (ref 0.2–1.0)
pH, UA: 7 (ref 5.0–7.5)

## 2020-10-15 LAB — MICROSCOPIC EXAMINATION: Casts: NONE SEEN /lpf

## 2020-10-15 LAB — URINE CULTURE, REFLEX

## 2020-10-18 ENCOUNTER — Encounter: Payer: Self-pay | Admitting: Nurse Practitioner

## 2020-10-21 ENCOUNTER — Telehealth: Payer: Self-pay

## 2020-10-21 NOTE — Telephone Encounter (Signed)
CALLED NO ANSWER LEFT VOICEMAIL FOR A CALL BACK 

## 2020-11-06 ENCOUNTER — Other Ambulatory Visit: Payer: Self-pay

## 2020-11-06 ENCOUNTER — Ambulatory Visit
Admission: RE | Admit: 2020-11-06 | Discharge: 2020-11-06 | Disposition: A | Discharge status: Home / Self Care | Payer: BC Managed Care – PPO | Source: Ambulatory Visit | Attending: Internal Medicine | Admitting: Internal Medicine

## 2020-11-06 DIAGNOSIS — Z1231 Encounter for screening mammogram for malignant neoplasm of breast: Secondary | ICD-10-CM | POA: Diagnosis present

## 2020-11-10 ENCOUNTER — Other Ambulatory Visit: Payer: Self-pay | Admitting: Internal Medicine

## 2020-11-10 DIAGNOSIS — R928 Other abnormal and inconclusive findings on diagnostic imaging of breast: Secondary | ICD-10-CM

## 2020-11-10 DIAGNOSIS — N6489 Other specified disorders of breast: Secondary | ICD-10-CM

## 2020-11-16 ENCOUNTER — Other Ambulatory Visit: Payer: Self-pay | Admitting: Nurse Practitioner

## 2020-11-16 DIAGNOSIS — N6489 Other specified disorders of breast: Secondary | ICD-10-CM

## 2020-11-16 DIAGNOSIS — R928 Other abnormal and inconclusive findings on diagnostic imaging of breast: Secondary | ICD-10-CM

## 2020-11-18 ENCOUNTER — Other Ambulatory Visit: Payer: Self-pay | Admitting: Nurse Practitioner

## 2020-11-18 ENCOUNTER — Telehealth: Payer: Self-pay

## 2020-11-18 DIAGNOSIS — N6489 Other specified disorders of breast: Secondary | ICD-10-CM

## 2020-11-18 DIAGNOSIS — R928 Other abnormal and inconclusive findings on diagnostic imaging of breast: Secondary | ICD-10-CM

## 2020-11-18 NOTE — Telephone Encounter (Signed)
Left vm for Samantha with [facility] regarding additional imaging request-Toni

## 2020-11-18 NOTE — Telephone Encounter (Signed)
Order signed and faxed back to [facility]

## 2020-11-26 NOTE — Progress Notes (Signed)
Ordered diagnostics

## 2020-12-08 ENCOUNTER — Ambulatory Visit: Payer: BC Managed Care – PPO | Admitting: Nurse Practitioner

## 2020-12-21 ENCOUNTER — Other Ambulatory Visit: Payer: Self-pay

## 2020-12-21 ENCOUNTER — Telehealth: Payer: Self-pay

## 2020-12-21 DIAGNOSIS — Z1211 Encounter for screening for malignant neoplasm of colon: Secondary | ICD-10-CM

## 2020-12-21 MED ORDER — NA SULFATE-K SULFATE-MG SULF 17.5-3.13-1.6 GM/177ML PO SOLN: 1.0000 | Freq: Once | ORAL | 0 refills | Status: AC

## 2020-12-21 NOTE — Progress Notes (Signed)
Gastroenterology Pre-Procedure Review  Request Date: 01/05/21 Requesting Physician: Dr. Vicente Males  PATIENT REVIEW QUESTIONS: The patient responded to the following health history questions as indicated:    1. Are you having any GI issues? no 2. Do you have a personal history of Polyps? no 3. Do you have a family history of Colon Cancer or Polyps? no 4. Diabetes Mellitus? yes (Type II) 5. Joint replacements in the past 12 months?no 6. Major health problems in the past 3 months?no 7. Any artificial heart valves, MVP, or defibrillator?no    MEDICATIONS & ALLERGIES:    Patient reports the following regarding taking any anticoagulation/antiplatelet therapy:   Plavix, Coumadin, Eliquis, Xarelto, Lovenox, Pradaxa, Brilinta, or Effient? no Aspirin? no  Patient confirms/reports the following medications:  Current Outpatient Medications  Medication Sig Dispense Refill   loratadine (CLARITIN) 10 MG tablet Take 1 tablet (10 mg total) by mouth daily. 30 tablet 5   metFORMIN (GLUCOPHAGE-XR) 500 MG 24 hr tablet TAKE 1 TABLET BY MOUTH EVERY DAY FOR DIABETES 30 tablet 3   montelukast (SINGULAIR) 10 MG tablet Take 1 tablet (10 mg total) by mouth daily. 30 tablet 5   Semaglutide, 1 MG/DOSE, 4 MG/3ML SOPN Inject 1 mg into the skin once a week. 6 mL 2   venlafaxine XR (EFFEXOR XR) 75 MG 24 hr capsule Take 1 capsule (75 mg total) by mouth daily with breakfast. 90 capsule 1   No current facility-administered medications for this visit.    Patient confirms/reports the following allergies:  Allergies  Allergen Reactions   Augmentin [Amoxicillin-Pot Clavulanate] Itching    No orders of the defined types were placed in this encounter.   AUTHORIZATION INFORMATION Primary Insurance: 1D#: Group #:  Secondary Insurance: 1D#: Group #:  SCHEDULE INFORMATION: Date: 01/05/21 Time: Location: [facility]

## 2020-12-21 NOTE — Telephone Encounter (Signed)
Pt. Calling to schedule a colonoscopy. She said anytime today will do until after 4 pm. 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

## 2020-12-21 NOTE — Telephone Encounter (Signed)
Procedure scheduled for 01/05/21.

## 2020-12-22 ENCOUNTER — Other Ambulatory Visit: Payer: Self-pay

## 2020-12-22 MED ORDER — CLENPIQ 10-3.5-12 MG-GM -GM/160ML PO SOLN: ORAL | 0 refills | Status: DC

## 2020-12-25 ENCOUNTER — Telehealth (INDEPENDENT_AMBULATORY_CARE_PROVIDER_SITE_OTHER): Payer: BC Managed Care – PPO | Admitting: Nurse Practitioner

## 2020-12-25 ENCOUNTER — Encounter: Payer: Self-pay | Admitting: Nurse Practitioner

## 2020-12-25 ENCOUNTER — Other Ambulatory Visit: Payer: Self-pay

## 2020-12-25 VITALS — Resp 16 | Ht 64.0 in | Wt 170.0 lb

## 2020-12-25 DIAGNOSIS — R051 Acute cough: Secondary | ICD-10-CM | POA: Diagnosis not present

## 2020-12-25 DIAGNOSIS — J018 Other acute sinusitis: Secondary | ICD-10-CM

## 2020-12-25 MED ORDER — AZITHROMYCIN 250 MG PO TABS: ORAL_TABLET | ORAL | 1 refills | Status: AC

## 2020-12-25 NOTE — Progress Notes (Signed)
[facility] [place], [place] [address]  Internal MEDICINE  Telephone Visit  Patient Name: Mandy Shea  [id number]  [id number]  Date of Service: 12/25/2020  I connected with the patient at 11:50 AM by telephone and verified the patients identity using two identifiers.   I discussed the limitations, risks, security and privacy concerns of performing an evaluation and management service by telephone and the availability of in person appointments. I also discussed with the patient that there may be a patient responsible charge related to the service.  The patient expressed understanding and agrees to proceed.    Chief Complaint  Patient presents with   Acute Visit    Negative covid test,    Telephone Assessment    Video    Telephone Screen    [phone]   Sinusitis    Chest congestion    HPI Mandy Shea presents for a telehealth virtual visit for possible symptoms of a sinus infection.Her covid test was negative. She reports having nasal congestion, chest congestion and cough. She denies fever, chills, chest tightness, SOB or wheezing.    Current Medication: Outpatient Encounter Medications as of 12/25/2020  Medication Sig   azithromycin (ZITHROMAX) 250 MG tablet Take 2 tablets on day 1, then 1 tablet daily on days 2 through 5   loratadine (CLARITIN) 10 MG tablet Take 1 tablet (10 mg total) by mouth daily.   metFORMIN (GLUCOPHAGE-XR) 500 MG 24 hr tablet TAKE 1 TABLET BY MOUTH EVERY DAY FOR DIABETES   montelukast (SINGULAIR) 10 MG tablet Take 1 tablet (10 mg total) by mouth daily.   Semaglutide, 1 MG/DOSE, 4 MG/3ML SOPN Inject 1 mg into the skin once a week.   Sod Picosulfate-Mag Ox-Cit Acd (CLENPIQ) 10-3.5-12 MG-GM -GM/160ML SOLN Take 1 bottle at 5 PM followed by five 8 oz cups of water and repeat 5 hours before procedure.   venlafaxine XR (EFFEXOR XR) 75 MG 24 hr capsule Take 1 capsule (75 mg total) by mouth daily with breakfast.   No  facility-administered encounter medications on file as of 12/25/2020.    Surgical History: Past Surgical History:  Procedure Laterality Date   CHOLECYSTECTOMY  09-26-13   CYSTOSCOPY N/A 01/12/2017   Procedure: CYSTOSCOPY;  Surgeon: Malachy Mood, MD;  Location: [facility] ORS;  Service: Gynecology;  Laterality: N/A;   HYSTEROSCOPY WITH D & C  11/24/2016   Procedure: DILATATION AND CURETTAGE /HYSTEROSCOPY;  Surgeon: Malachy Mood, MD;  Location: [facility] ORS;  Service: Gynecology;;   KNEE SURGERY Right    LEEP  2010   [facility]   TONSILLECTOMY  2014   TOTAL LAPAROSCOPIC HYSTERECTOMY WITH SALPINGECTOMY Right 01/12/2017   Procedure: TOTAL LAPAROSCOPIC HYSTERECTOMY WITH RIGHT SALPINGECTOMY;  Surgeon: Malachy Mood, MD;  Location: [facility];  Service: Gynecology;  Laterality: Right;   TUBAL LIGATION  1997    Medical History: Past Medical History:  Diagnosis Date   Anemia    AS A CHILD   Diabetes mellitus without complication (HCC)    Fibroids    Hemorrhoids    Seasonal allergies    Sleep apnea    NO CPAP    Family History: Family History  Problem Relation Age of Onset   Diabetes Sister    Breast cancer Neg Hx     Social History   Socioeconomic History   Marital status: Married    Spouse name: Not on file   Number of children: Not on file   Years of education: Not on file   Highest  education level: Not on file  Occupational History   Not on file  Tobacco Use   Smoking status: Never   Smokeless tobacco: Never  Vaping Use   Vaping Use: Never used  Substance and Sexual Activity   Alcohol use: No   Drug use: No   Sexual activity: Yes    Birth control/protection: None  Other Topics Concern   Not on file  Social History Narrative   Not on file   Social Determinants of Health   Financial Resource Strain: Not on file  Food Insecurity: Not on file  Transportation Needs: Not on file  Physical Activity: Not on file  Stress: Not on file  Social Connections: Not on  file  Intimate Partner Violence: Not on file      Review of Systems  Constitutional:  Positive for fatigue. Negative for appetite change, chills and fever.  HENT:  Positive for congestion, postnasal drip, rhinorrhea, sinus pressure, sinus pain and sore throat. Negative for ear pain and sneezing.   Eyes:  Negative for pain.  Respiratory:  Positive for cough. Negative for chest tightness, shortness of breath and wheezing.   Cardiovascular: Negative.  Negative for chest pain and palpitations.  Gastrointestinal: Negative.  Negative for abdominal pain, constipation, diarrhea, nausea and vomiting.  Musculoskeletal: Negative.  Negative for myalgias.  Skin: Negative.  Negative for rash.  Neurological:  Positive for headaches. Negative for dizziness and light-headedness.   Vital Signs: Resp 16   Ht 5\' 4"  (1.626 m)   Wt 170 lb (77.1 kg)   LMP 12/28/2016   BMI 29.18 kg/m    Observation/Objective: She is alert and oriented, and engages in conversation appropriately. She does not appear to be in any acute distress over video call.     Assessment/Plan: 1. Acute non-recurrent sinusitis of other sinus Empiric antibiotic treatment prescribed - azithromycin (ZITHROMAX) 250 MG tablet; Take 2 tablets on day 1, then 1 tablet daily on days 2 through 5  Dispense: 6 tablet; Refill: 1  2. Acute cough May use desired OTC medication for symptomatic treatment of cough if needed.   General Counseling: Mandy Shea verbalizes understanding of the findings of today's phone visit and agrees with plan of treatment. I have discussed any further diagnostic evaluation that may be needed or ordered today. We also reviewed her medications today. she has been encouraged to call the office with any questions or concerns that should arise related to todays visit.  Return if symptoms worsen or fail to improve.   No orders of the defined types were placed in this encounter.   Meds ordered this encounter  Medications    azithromycin (ZITHROMAX) 250 MG tablet    Sig: Take 2 tablets on day 1, then 1 tablet daily on days 2 through 5    Dispense:  6 tablet    Refill:  1     Time spent:10 Minutes Time spent with patient included reviewing progress notes, labs, imaging studies, and discussing plan for follow up.  [place] Controlled Substance Database was reviewed by me for overdose risk score (ORS) if appropriate.  This patient was seen by Jonetta Osgood, FNP-C in collaboration with Dr. Clayborn Bigness as a part of collaborative care agreement.  Lillyonna Armstead R. Valetta Fuller, MSN, FNP-C Internal medicine

## 2021-01-01 ENCOUNTER — Ambulatory Visit
Admission: RE | Admit: 2021-01-01 | Discharge: 2021-01-01 | Disposition: A | Discharge status: Home / Self Care | Payer: BC Managed Care – PPO | Source: Ambulatory Visit | Attending: Nurse Practitioner | Admitting: Nurse Practitioner

## 2021-01-01 ENCOUNTER — Other Ambulatory Visit: Payer: Self-pay

## 2021-01-01 DIAGNOSIS — N6489 Other specified disorders of breast: Secondary | ICD-10-CM | POA: Insufficient documentation

## 2021-01-01 DIAGNOSIS — R928 Other abnormal and inconclusive findings on diagnostic imaging of breast: Secondary | ICD-10-CM | POA: Diagnosis present

## 2021-01-04 ENCOUNTER — Encounter: Payer: Self-pay | Admitting: Gastroenterology

## 2021-01-05 ENCOUNTER — Ambulatory Visit: Payer: BC Managed Care – PPO | Admitting: Certified Registered Nurse Anesthetist

## 2021-01-05 ENCOUNTER — Ambulatory Visit
Admission: RE | Admit: 2021-01-05 | Discharge: 2021-01-05 | Disposition: A | Discharge status: Home / Self Care | Payer: BC Managed Care – PPO | Attending: Gastroenterology | Admitting: Gastroenterology

## 2021-01-05 ENCOUNTER — Encounter: Payer: Self-pay | Admitting: Gastroenterology

## 2021-01-05 ENCOUNTER — Encounter
Admission: RE | Disposition: A | Discharge status: Home / Self Care | Payer: Self-pay | Source: Home / Self Care | Attending: Gastroenterology

## 2021-01-05 ENCOUNTER — Other Ambulatory Visit: Payer: Self-pay

## 2021-01-05 DIAGNOSIS — G4730 Sleep apnea, unspecified: Secondary | ICD-10-CM | POA: Diagnosis not present

## 2021-01-05 DIAGNOSIS — Z1211 Encounter for screening for malignant neoplasm of colon: Secondary | ICD-10-CM | POA: Diagnosis present

## 2021-01-05 DIAGNOSIS — E119 Type 2 diabetes mellitus without complications: Secondary | ICD-10-CM | POA: Insufficient documentation

## 2021-01-05 HISTORY — PX: COLONOSCOPY WITH PROPOFOL: SHX5780

## 2021-01-05 LAB — GLUCOSE, CAPILLARY: Glucose-Capillary: 88 mg/dL (ref 70–99)

## 2021-01-05 SURGERY — COLONOSCOPY WITH PROPOFOL
Anesthesia: General

## 2021-01-05 MED ORDER — SODIUM CHLORIDE 0.9 % IV SOLN: INTRAVENOUS | Status: DC

## 2021-01-05 MED ORDER — PROPOFOL 500 MG/50ML IV EMUL
INTRAVENOUS | Status: DC | PRN
  Administered 2021-01-05: 140 ug/kg/min via INTRAVENOUS

## 2021-01-05 MED ORDER — PROPOFOL 10 MG/ML IV BOLUS
INTRAVENOUS | Status: DC | PRN
  Administered 2021-01-05: 60 mg via INTRAVENOUS

## 2021-01-05 MED ORDER — LIDOCAINE HCL (CARDIAC) PF 100 MG/5ML IV SOSY
PREFILLED_SYRINGE | INTRAVENOUS | Status: DC | PRN
  Administered 2021-01-05: 100 mg via INTRAVENOUS

## 2021-01-05 NOTE — Transfer of Care (Signed)
Immediate Anesthesia Transfer of Care Note  Patient: Mandy Shea  Procedure(s) Performed: COLONOSCOPY WITH PROPOFOL  Patient Location: Endoscopy Unit  Anesthesia Type:General  Level of Consciousness: drowsy  Airway & Oxygen Therapy: Patient Spontanous Breathing  Post-op Assessment: Report given to RN and Post -op Vital signs reviewed and stable  Post vital signs: Reviewed and stable  Last Vitals:  Vitals Value Taken Time  BP 98/57   Temp    Pulse 76   Resp 8   SpO2 97     Last Pain:  Vitals:   01/05/21 0953  TempSrc: Temporal  PainSc: 0-No pain         Complications: No notable events documented.

## 2021-01-05 NOTE — Anesthesia Preprocedure Evaluation (Signed)
Anesthesia Evaluation  Patient identified by MRN, date of birth, ID band Patient awake    Reviewed: Allergy & Precautions, NPO status , Patient's Chart, lab work & pertinent test results  History of Anesthesia Complications Negative for: history of anesthetic complications  Airway Mallampati: II  TM Distance: >3 FB Neck ROM: Full    Dental no notable dental hx. (+) Teeth Intact   Pulmonary sleep apnea , neg COPD, Patient abstained from smoking.Not current smoker,    Pulmonary exam normal breath sounds clear to auscultation       Cardiovascular Exercise Tolerance: Good METShypertension, (-) CAD and (-) Past MI (-) dysrhythmias  Rhythm:Regular Rate:Normal - Systolic murmurs    Neuro/Psych negative neurological ROS  negative psych ROS   GI/Hepatic neg GERD  ,(+)     (-) substance abuse  ,   Endo/Other  diabetes, Type 2  Renal/GU negative Renal ROS     Musculoskeletal   Abdominal   Peds  Hematology   Anesthesia Other Findings Past Medical History: No date: Anemia     Comment:  AS A CHILD No date: Diabetes mellitus without complication (HCC) No date: Fibroids No date: Hemorrhoids No date: Seasonal allergies No date: Sleep apnea     Comment:  NO CPAP  Reproductive/Obstetrics                             Anesthesia Physical Anesthesia Plan  ASA: 2  Anesthesia Plan: General   Post-op Pain Management:    Induction: Intravenous  PONV Risk Score and Plan: 3 and Ondansetron, Propofol infusion and TIVA  Airway Management Planned: Nasal Cannula  Additional Equipment: None  Intra-op Plan:   Post-operative Plan:   Informed Consent: I have reviewed the patients History and Physical, chart, labs and discussed the procedure including the risks, benefits and alternatives for the proposed anesthesia with the patient or authorized representative who has indicated his/her understanding and  acceptance.     Dental advisory given  Plan Discussed with: CRNA and Surgeon  Anesthesia Plan Comments: (Discussed risks of anesthesia with patient, including possibility of difficulty with spontaneous ventilation under anesthesia necessitating airway intervention, PONV, and rare risks such as cardiac or respiratory or neurological events, and allergic reactions. Discussed the role of CRNA in patient's perioperative care. Patient understands.)        Anesthesia Quick Evaluation

## 2021-01-05 NOTE — H&P (Signed)
Mandy Bellows, MD [address] [place], [place], [place], [address] [address], [place], [place], [place], [address] Phone: [phone]  Fax: [phone]  Primary Care Physician:  Jonetta Osgood, NP   Pre-Procedure History & Physical: HPI:  Mandy Shea is a 55 y.o. female is here for an colonoscopy.   Past Medical History:  Diagnosis Date   Anemia    AS A CHILD   Diabetes mellitus without complication ([place])    Fibroids    Hemorrhoids    Seasonal allergies    Sleep apnea    NO CPAP    Past Surgical History:  Procedure Laterality Date   CHOLECYSTECTOMY  09-26-13   CYSTOSCOPY N/A 01/12/2017   Procedure: CYSTOSCOPY;  Surgeon: Malachy Mood, MD;  Location: [facility] ORS;  Service: Gynecology;  Laterality: N/A;   HYSTEROSCOPY WITH D & C  11/24/2016   Procedure: DILATATION AND CURETTAGE /HYSTEROSCOPY;  Surgeon: Malachy Mood, MD;  Location: [facility] ORS;  Service: Gynecology;;   KNEE SURGERY Right    LEEP  2010   [facility]   TONSILLECTOMY  2014   TOTAL LAPAROSCOPIC HYSTERECTOMY WITH SALPINGECTOMY Right 01/12/2017   Procedure: TOTAL LAPAROSCOPIC HYSTERECTOMY WITH RIGHT SALPINGECTOMY;  Surgeon: Malachy Mood, MD;  Location: [facility] ORS;  Service: Gynecology;  Laterality: Right;   TUBAL LIGATION  1997    Prior to Admission medications   Medication Sig Start Date End Date Taking? Authorizing Provider  loratadine (CLARITIN) 10 MG tablet Take 1 tablet (10 mg total) by mouth daily. 05/25/20  Yes Luiz Ochoa, NP  metFORMIN (GLUCOPHAGE-XR) 500 MG 24 hr tablet TAKE 1 TABLET BY MOUTH EVERY DAY FOR DIABETES 05/25/20  Yes Luiz Ochoa, NP  montelukast (SINGULAIR) 10 MG tablet Take 1 tablet (10 mg total) by mouth daily. 05/25/20  Yes Luiz Ochoa, NP  Semaglutide, 1 MG/DOSE, 4 MG/3ML SOPN Inject 1 mg into the skin once a week. 09/01/20  Yes Abernathy, Yetta Flock, NP  venlafaxine XR (EFFEXOR XR) 75 MG 24 hr capsule Take 1 capsule (75 mg total) by mouth daily with  breakfast. 10/02/20  Yes Abernathy, Alyssa, NP  Sod Picosulfate-Mag Ox-Cit Acd (CLENPIQ) 10-3.5-12 MG-GM -GM/160ML SOLN Take 1 bottle at 5 PM followed by five 8 oz cups of water and repeat 5 hours before procedure. 12/22/20   Mandy Bellows, MD    Allergies as of 12/22/2020 - Review Complete 10/12/2020  Allergen Reaction Noted   Augmentin [amoxicillin-pot clavulanate] Itching 09/23/2013    Family History  Problem Relation Age of Onset   Diabetes Sister    Breast cancer Neg Hx     Social History   Socioeconomic History   Marital status: Married    Spouse name: Not on file   Number of children: Not on file   Years of education: Not on file   Highest education level: Not on file  Occupational History   Not on file  Tobacco Use   Smoking status: Never   Smokeless tobacco: Never  Vaping Use   Vaping Use: Never used  Substance and Sexual Activity   Alcohol use: No   Drug use: No   Sexual activity: Yes    Birth control/protection: None  Other Topics Concern   Not on file  Social History Narrative   Not on file   Social Determinants of Health   Financial Resource Strain: Not on file  Food Insecurity: Not on file  Transportation Needs: Not on file  Physical Activity: Not on file  Stress:  Not on file  Social Connections: Not on file  Intimate Partner Violence: Not on file    Review of Systems: See HPI, otherwise negative ROS  Physical Exam: BP (!) 139/94   Pulse 75   Temp (!) 96.5 F (35.8 C) (Temporal)   Resp 16   Ht 5\' 4"  (1.626 m)   Wt 74.8 kg   LMP 12/28/2016   SpO2 100%   BMI 28.32 kg/m  General:   Alert,  pleasant and cooperative in NAD Head:  Normocephalic and atraumatic. Neck:  Supple; no masses or thyromegaly. Lungs:  Clear throughout to auscultation, normal respiratory effort.    Heart:  +S1, +S2, Regular rate and rhythm, No edema. Abdomen:  Soft, nontender and nondistended. Normal bowel sounds, without guarding, and without rebound.   Neurologic:   Alert and  oriented x4;  grossly normal neurologically.  Impression/Plan: [name] is here for an colonoscopy to be performed for Screening colonoscopy average risk   Risks, benefits, limitations, and alternatives regarding  colonoscopy have been reviewed with the patient.  Questions have been answered.  All parties agreeable.   Mandy Bellows, MD  01/05/2021, 10:08 AM

## 2021-01-05 NOTE — Anesthesia Procedure Notes (Signed)
Date/Time: 01/05/2021 10:21 AM Performed by: Lily Peer, Keyuna Cuthrell, CRNA Pre-anesthesia Checklist: Emergency Drugs available, Patient identified, Suction available, Patient being monitored and Timeout performed Patient Re-evaluated:Patient Re-evaluated prior to induction Oxygen Delivery Method: Nasal cannula Induction Type: IV induction

## 2021-01-05 NOTE — Anesthesia Postprocedure Evaluation (Signed)
Anesthesia Post Note  Patient: EBONYE READE  Procedure(s) Performed: COLONOSCOPY WITH PROPOFOL  Patient location during evaluation: Endoscopy Anesthesia Type: General Level of consciousness: awake and alert Pain management: pain level controlled Vital Signs Assessment: post-procedure vital signs reviewed and stable Respiratory status: spontaneous breathing, nonlabored ventilation, respiratory function stable and patient connected to nasal cannula oxygen Cardiovascular status: blood pressure returned to baseline and stable Postop Assessment: no apparent nausea or vomiting Anesthetic complications: no   No notable events documented.   Last Vitals:  Vitals:   01/05/21 1045 01/05/21 1055  BP: (!) 98/57 97/64  Pulse: 78 67  Resp: 10 17  Temp: (!) 35.7 C   SpO2: 96% 100%    Last Pain:  Vitals:   01/05/21 1055  TempSrc:   PainSc: 0-No pain                 Arita Miss

## 2021-01-05 NOTE — Op Note (Signed)
[facility] Gastroenterology Patient Name: Mandy Shea Procedure Date: 01/05/2021 10:11 AM MRN: [id number] Account #: [id number] Date of Birth: 1965-07-26 Admit Type: Inpatient Age: 55 Room: [facility] ENDO ROOM 2 Gender: Female Note Status: Finalized Instrument Name: Park Meo [id number] Procedure:             Colonoscopy Indications:           Screening for colorectal malignant neoplasm Providers:             Jonathon Bellows MD, MD Referring MD:          Jonetta Osgood (Referring MD) Medicines:             Monitored Anesthesia Care Complications:         No immediate complications. Procedure:             Pre-Anesthesia Assessment:                        - Prior to the procedure, a History and Physical was                         performed, and patient medications, allergies and                         sensitivities were reviewed. The patient's tolerance                         of previous anesthesia was reviewed.                        - The risks and benefits of the procedure and the                         sedation options and risks were discussed with the                         patient. All questions were answered and informed                         consent was obtained.                        - ASA Grade Assessment: II - A patient with mild                         systemic disease.                        After obtaining informed consent, the colonoscope was                         passed under direct vision. Throughout the procedure,                         the patient's blood pressure, pulse, and oxygen                         saturations were monitored continuously. The                         Colonoscope was introduced through  the anus and                         advanced to the the cecum, identified by the                         appendiceal orifice. The colonoscopy was performed                         with ease. The patient tolerated the procedure well.                          The quality of the bowel preparation was good. Findings:      The perianal and digital rectal examinations were normal.      The entire examined colon appeared normal on direct and retroflexion       views. Impression:            - The entire examined colon is normal on direct and                         retroflexion views.                        - No specimens collected. Recommendation:        - Discharge patient to home (with escort).                        - Resume previous diet.                        - Continue present medications.                        - Repeat colonoscopy in 10 years for screening                         purposes. Procedure Code(s):     --- Professional ---                        [phone], Colonoscopy, flexible; diagnostic, including                         collection of specimen(s) by brushing or washing, when                         performed (separate procedure) Diagnosis Code(s):     --- Professional ---                        Z12.11, Encounter for screening for malignant neoplasm                         of colon CPT copyright 2019 American Medical Association. All rights reserved. The codes documented in this report are preliminary and upon coder review may  be revised to meet current compliance requirements. Jonathon Bellows, MD Jonathon Bellows MD, MD 01/05/2021 10:43:52 AM This report has been signed electronically. Number of Addenda: 0 Note Initiated On: 01/05/2021 10:11 AM Scope Withdrawal Time: 0 hours 10 minutes 21 seconds  Total Procedure Duration: 0 hours 18 minutes  4 seconds  Estimated Blood Loss:  Estimated blood loss: none.      [facility]

## 2021-01-06 ENCOUNTER — Encounter: Payer: Self-pay | Admitting: Gastroenterology

## 2021-02-12 ENCOUNTER — Other Ambulatory Visit: Payer: Self-pay | Admitting: Nurse Practitioner

## 2021-02-12 DIAGNOSIS — E1165 Type 2 diabetes mellitus with hyperglycemia: Secondary | ICD-10-CM

## 2021-03-05 ENCOUNTER — Other Ambulatory Visit: Payer: Self-pay | Admitting: Nurse Practitioner

## 2021-03-05 DIAGNOSIS — J3089 Other allergic rhinitis: Secondary | ICD-10-CM

## 2021-03-05 MED ORDER — MONTELUKAST SODIUM 10 MG PO TABS: 10.0000 mg | ORAL_TABLET | Freq: Every day | ORAL | 5 refills | Status: DC

## 2021-04-13 ENCOUNTER — Other Ambulatory Visit: Payer: Self-pay | Admitting: Nurse Practitioner

## 2021-05-28 ENCOUNTER — Encounter: Payer: Self-pay | Admitting: Physician Assistant

## 2021-05-28 ENCOUNTER — Telehealth (INDEPENDENT_AMBULATORY_CARE_PROVIDER_SITE_OTHER): Payer: BC Managed Care – PPO | Admitting: Physician Assistant

## 2021-05-28 VITALS — Resp 16 | Ht 64.0 in | Wt 164.0 lb

## 2021-05-28 DIAGNOSIS — J3089 Other allergic rhinitis: Secondary | ICD-10-CM

## 2021-05-28 NOTE — Progress Notes (Signed)
[facility] ?[address] ?[place], [place] [address] ? ?Internal MEDICINE  ?Telephone Visit ? ?Patient Name: Mandy Shea ? [id number]  ?[id number] ? ?Date of Service: 06/09/2021 ? ?I connected with the patient at 10:55 by telephone and verified the patients identity using two identifiers.   ?I discussed the limitations, risks, security and privacy concerns of performing an evaluation and management service by telephone and the availability of in person appointments. I also discussed with the patient that there may be a patient responsible charge related to the service.  The patient expressed understanding and agrees to proceed.   ? ?Chief Complaint  ?Patient presents with  ? Acute Visit  ? Telephone Assessment  ?  [phone]   ? Telephone Screen  ?  video  ? Sinusitis  ?  Congestion, mucus in throat, almost itchy feels like something is stuck in throat   ? Cough  ?  When laying down   ? ? ?HPI ?Pt is here for virtual sick visit ?-Symptoms started on Monday with nausea and headache. Tried some ibuprofen and gatorade and sinus pressure initially got worse.  ?-She has been having congestion, sore/scratchy throat, mucus production, and cough, but then improved when she woke up the next morning. ?-Takes claritin and singulair daily.  ?-Discussed adding nasal spray since symptoms seem consistent with allergic rhinitis with weather changes given improvement ? ?Current Medication: ?Outpatient Encounter Medications as of 05/28/2021  ?Medication Sig  ? Dupilumab (DUPIXENT [place]) Inject into the skin.  ? loratadine (CLARITIN) 10 MG tablet TAKE 1 TABLET BY MOUTH DAILY  ? metFORMIN (GLUCOPHAGE-XR) 500 MG 24 hr tablet TAKE 1 TABLET BY MOUTH EVERY DAY FOR DIABETES  ? montelukast (SINGULAIR) 10 MG tablet Take 1 tablet (10 mg total) by mouth daily.  ? OZEMPIC, 1 MG/DOSE, 4 MG/3ML SOPN INJECT 1 MG INTO THE SKIN ONCE A WEEK.  ? Sod Picosulfate-Mag Ox-Cit Acd (CLENPIQ) 10-3.5-12 MG-GM -GM/160ML SOLN Take 1 bottle at 5 PM  followed by five 8 oz cups of water and repeat 5 hours before procedure.  ? [DISCONTINUED] venlafaxine XR (EFFEXOR-XR) 75 MG 24 hr capsule TAKE 1 CAPSULE BY MOUTH DAILY WITH BREAKFAST  ? ?No facility-administered encounter medications on file as of 05/28/2021.  ? ? ?Surgical History: ?Past Surgical History:  ?Procedure Laterality Date  ? CHOLECYSTECTOMY  09-26-13  ? COLONOSCOPY WITH PROPOFOL N/A 01/05/2021  ? Procedure: COLONOSCOPY WITH PROPOFOL;  Surgeon: Jonathon Bellows, MD;  Location: [facility] ENDOSCOPY;  Service: Gastroenterology;  Laterality: N/A;  ? CYSTOSCOPY N/A 01/12/2017  ? Procedure: CYSTOSCOPY;  Surgeon: Malachy Mood, MD;  Location: [facility] ORS;  Service: Gynecology;  Laterality: N/A;  ? HYSTEROSCOPY WITH D & C  11/24/2016  ? Procedure: DILATATION AND CURETTAGE /HYSTEROSCOPY;  Surgeon: Malachy Mood, MD;  Location: [facility] ORS;  Service: Gynecology;;  ? KNEE SURGERY Right   ? LEEP  2010  ? [facility]  ? TONSILLECTOMY  2014  ? TOTAL LAPAROSCOPIC HYSTERECTOMY WITH SALPINGECTOMY Right 01/12/2017  ? Procedure: TOTAL LAPAROSCOPIC HYSTERECTOMY WITH RIGHT SALPINGECTOMY;  Surgeon: Malachy Mood, MD;  Location: [facility] ORS;  Service: Gynecology;  Laterality: Right;  ? TUBAL LIGATION  1997  ? ? ?Medical History: ?Past Medical History:  ?Diagnosis Date  ? Anemia   ? AS A CHILD  ? Diabetes mellitus without complication ([place])   ? Fibroids   ? Hemorrhoids   ? Seasonal allergies   ? Sleep apnea   ? NO CPAP  ? ? ?Family History: ?Family History  ?Problem Relation Age  of Onset  ? Diabetes Sister   ? Breast cancer Neg Hx   ? ? ?Social History  ? ?Socioeconomic History  ? Marital status: Married  ?  Spouse name: Not on file  ? Number of children: Not on file  ? Years of education: Not on file  ? Highest education level: Not on file  ?Occupational History  ? Not on file  ?Tobacco Use  ? Smoking status: Never  ? Smokeless tobacco: Never  ?Vaping Use  ? Vaping Use: Never used  ?Substance and Sexual Activity  ? Alcohol use: No  ? Drug  use: No  ? Sexual activity: Yes  ?  Birth control/protection: None  ?Other Topics Concern  ? Not on file  ?Social History Narrative  ? Not on file  ? ?Social Determinants of Health  ? ?Financial Resource Strain: Not on file  ?Food Insecurity: Not on file  ?Transportation Needs: Not on file  ?Physical Activity: Not on file  ?Stress: Not on file  ?Social Connections: Not on file  ?Intimate Partner Violence: Not on file  ? ? ? ? ?Review of Systems  ?Constitutional:  Negative for fatigue and fever.  ?HENT:  Positive for congestion, postnasal drip and sore throat. Negative for mouth sores.   ?Respiratory:  Positive for cough.   ?Cardiovascular:  Negative for chest pain.  ?Genitourinary:  Negative for flank pain.  ?Psychiatric/Behavioral: Negative.    ? ?Vital Signs: ?Resp 16   Ht '5\' 4"'$  (1.626 m)   Wt 164 lb (74.4 kg)   LMP 12/28/2016   BMI 28.15 kg/m?  ? ? ?Observation/Objective: ? ?Pt is able to carry out conversation ? ? ?Assessment/Plan: ?1. Seasonal allergic rhinitis due to other allergic trigger ?Will continue singulair and claritin and add nasal spray. May use mucinex as needed for congestion. Since doing better will hold off on any antibiotics. Call if worsening ? ? ?General Counseling: [name] verbalizes understanding of the findings of today's phone visit and agrees with plan of treatment. I have discussed any further diagnostic evaluation that may be needed or ordered today. We also reviewed her medications today. she has been encouraged to call the office with any questions or concerns that should arise related to todays visit. ? ? ? ?No orders of the defined types were placed in this encounter. ? ? ?No orders of the defined types were placed in this encounter. ? ? ?Time spent:25 Minutes ? ? ? ?Dr Lavera Guise ?Internal medicine  ?

## 2021-06-08 ENCOUNTER — Other Ambulatory Visit: Payer: Self-pay | Admitting: Nurse Practitioner

## 2021-06-11 ENCOUNTER — Ambulatory Visit (INDEPENDENT_AMBULATORY_CARE_PROVIDER_SITE_OTHER): Payer: BC Managed Care – PPO | Admitting: Nurse Practitioner

## 2021-06-11 ENCOUNTER — Encounter: Payer: Self-pay | Admitting: Nurse Practitioner

## 2021-06-11 VITALS — BP 126/90 | HR 87 | Temp 98.3°F | Resp 16 | Ht 64.0 in | Wt 171.2 lb

## 2021-06-11 DIAGNOSIS — E1165 Type 2 diabetes mellitus with hyperglycemia: Secondary | ICD-10-CM | POA: Diagnosis not present

## 2021-06-11 DIAGNOSIS — Z6829 Body mass index (BMI) 29.0-29.9, adult: Secondary | ICD-10-CM

## 2021-06-11 DIAGNOSIS — J018 Other acute sinusitis: Secondary | ICD-10-CM | POA: Diagnosis not present

## 2021-06-11 LAB — POCT GLYCOSYLATED HEMOGLOBIN (HGB A1C): Hemoglobin A1C: 6.8 % — AB (ref 4.0–5.6)

## 2021-06-11 MED ORDER — AZITHROMYCIN 250 MG PO TABS: ORAL_TABLET | ORAL | 0 refills | Status: AC

## 2021-06-11 MED ORDER — SEMAGLUTIDE (2 MG/DOSE) 8 MG/3ML ~~LOC~~ SOPN: 2.0000 mg | PEN_INJECTOR | SUBCUTANEOUS | 1 refills | Status: DC

## 2021-06-11 NOTE — Progress Notes (Signed)
[facility] ?[address] ?[place], [place] [address] ? ?Internal MEDICINE  ?Office Visit Note ? ?Patient Name: Mandy Shea ? [id number]  ?[id number] ? ?Date of Service: 06/11/2021 ? ?Chief Complaint  ?Patient presents with  ? Follow-up  ?  Discuss meds, bumps on mostly arms but a few on legs   ? Diabetes  ? Anemia  ? Sinusitis  ? ? ?HPI ?Amrutha presents for a follow up visit for diabetes and possible sinusitis. Her A1C is 6.8 today which is elevated went compared to 6.1 in July last year. She is currently taking ozempic 1 mg weekly,  ?She reports symptoms of a sinus infection including nasal congestion, sore throat, cough, and sinus pressure.  ?She has some bumps that itch on her arms and legs, they look like bug bites.  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  ? ? ? ?Current Medication: ?Outpatient Encounter Medications as of 06/11/2021  ?Medication Sig  ? azithromycin (ZITHROMAX) 250 MG tablet Take 2 tablets on day 1, then 1 tablet daily on days 2 through 5  ? Dupilumab (DUPIXENT [place]) Inject 300 mg into the skin.  ? loratadine (CLARITIN) 10 MG tablet TAKE 1 TABLET BY MOUTH DAILY  ? metFORMIN (GLUCOPHAGE-XR) 500 MG 24 hr tablet TAKE 1 TABLET BY MOUTH EVERY DAY FOR DIABETES  ? montelukast (SINGULAIR) 10 MG tablet Take 1 tablet (10 mg total) by mouth daily.  ? Semaglutide, 2 MG/DOSE, 8 MG/3ML SOPN Inject 2 mg as directed once a week.  ? Sod Picosulfate-Mag Ox-Cit Acd (CLENPIQ) 10-3.5-12 MG-GM -GM/160ML SOLN Take 1 bottle at 5 PM followed by five 8 oz cups of water and repeat 5 hours before procedure.  ? venlafaxine XR (EFFEXOR-XR) 75 MG 24 hr capsule TAKE 1 CAPSULE BY MOUTH DAILY WITH BREAKFAST  ? [DISCONTINUED] OZEMPIC, 1 MG/DOSE, 4 MG/3ML SOPN INJECT 1 MG INTO THE SKIN ONCE A WEEK.  ? ?No facility-administered encounter medications on file as of 06/11/2021.  ? ? ?Surgical History: ?Past Surgical History:  ?Procedure Laterality Date  ? CHOLECYSTECTOMY  09-26-13  ? COLONOSCOPY  WITH PROPOFOL N/A 01/05/2021  ? Procedure: COLONOSCOPY WITH PROPOFOL;  Surgeon: Jonathon Bellows, MD;  Location: [facility] ENDOSCOPY;  Service: Gastroenterology;  Laterality: N/A;  ? CYSTOSCOPY N/A 01/12/2017  ? Procedure: CYSTOSCOPY;  Surgeon: Malachy Mood, MD;  Location: [facility] ORS;  Service: Gynecology;  Laterality: N/A;  ? HYSTEROSCOPY WITH D & C  11/24/2016  ? Procedure: DILATATION AND CURETTAGE /HYSTEROSCOPY;  Surgeon: Malachy Mood, MD;  Location: [facility] ORS;  Service: Gynecology;;  ? KNEE SURGERY Right   ? LEEP  2010  ? [facility]  ? TONSILLECTOMY  2014  ? TOTAL LAPAROSCOPIC HYSTERECTOMY WITH SALPINGECTOMY Right 01/12/2017  ? Procedure: TOTAL LAPAROSCOPIC HYSTERECTOMY WITH RIGHT SALPINGECTOMY;  Surgeon: Malachy Mood, MD;  Location: [facility] ORS;  Service: Gynecology;  Laterality: Right;  ? TUBAL LIGATION  1997  ? ? ?Medical History: ?Past Medical History:  ?Diagnosis Date  ? Anemia   ? AS A CHILD  ? Diabetes mellitus [place] complication ([place]   ? Fibroids   ? Hemorrhoids   ? Seasonal allergies   ? Sleep apnea   ? NO CPAP  ? ? ?Family History: ?Family History  ?Problem Relation Age of Onset  ? Diabetes Sister   ? Breast cancer Neg Hx   ? ? ?Social History  ? ?Socioeconomic History  ? Marital status: Married  ?  Spouse name: Not on file  ? Number of children: Not on file  ?  Years of education: Not on file  ? Highest education level: Not on file  ?Occupational History  ? Not on file  ?Tobacco Use  ? Smoking status: Never  ? Smokeless tobacco: Never  ?Vaping Use  ? Vaping Use: Never used  ?Substance and Sexual Activity  ? Alcohol use: No  ? Drug use: No  ? Sexual activity: Yes  ?  Birth control/protection: None  ?Other Topics Concern  ? Not on file  ?Social History Narrative  ? Not on file  ? ?Social Determinants of Health  ? ?Financial Resource Strain: Not on file  ?Food Insecurity: Not on file  ?Transportation Needs: Not on file  ?Physical Activity: Not on file  ?Stress: Not on file  ?Social Connections: Not on file   ?Intimate Partner Violence: Not on file  ? ? ? ? ?Review of Systems  ?Constitutional:  Negative for chills, fatigue and fever.  ?HENT:  Positive for congestion, postnasal drip, rhinorrhea, sinus pressure, sinus pain, sneezing and sore throat. Negative for nosebleeds.   ?Respiratory:  Positive for cough. Negative for chest tightness, shortness of breath and wheezing.   ?Cardiovascular: Negative.  Negative for chest pain and palpitations.  ?Gastrointestinal:  Negative for abdominal pain, constipation, diarrhea, nausea and vomiting.  ?Musculoskeletal: Negative.  Negative for arthralgias and myalgias.  ?Skin: Negative.  Negative for rash.  ?Neurological: Negative.  Negative for headaches.  ? ?Vital Signs: ?BP 126/90   Pulse 87   Temp 98.3 ?F (36.8 ?C)   Resp 16   Ht '5\' 4"'$  (1.626 m)   Wt 171 lb 3.2 oz (77.7 kg)   LMP 12/28/2016   SpO2 98%   BMI 29.39 kg/m?  ? ? ?Physical Exam ?Vitals reviewed.  ?Constitutional:   ?   General: She is not in acute distress. ?   Appearance: Normal appearance. She is not ill-appearing.  ?HENT:  ?   Head: Normocephalic and atraumatic.  ?   Right Ear: Tympanic membrane, ear canal and external ear normal.  ?   Left Ear: Tympanic membrane, ear canal and external ear normal.  ?Eyes:  ?   Pupils: Pupils are equal, round, and reactive to light.  ?Cardiovascular:  ?   Rate and Rhythm: Normal rate and regular rhythm.  ?   Heart sounds: Normal heart sounds. No murmur heard. ?Pulmonary:  ?   Effort: Pulmonary effort is normal. No respiratory distress.  ?   Breath sounds: Normal breath sounds. No wheezing.  ?Neurological:  ?   Mental Status: She is alert and oriented to person, place, and time.  ?Psychiatric:     ?   Mood and Affect: Mood normal.     ?   Behavior: Behavior normal.  ? ? ? ? ? ?Assessment/Plan: ?1. Acute non-recurrent sinusitis of other sinus ?Empiric antibiotic treatment prescribed.  ?- azithromycin (ZITHROMAX) 250 MG tablet; Take 2 tablets on day 1, then 1 tablet daily on days  2 through 5  Dispense: 6 tablet; Refill: 0 ? ?2. Type 2 diabetes mellitus with hyperglycemia, without long-term current use of insulin ([place]) ?A1C is slightly elevated at 6.8, will increase ozempic dose to 2 mg weekly. Will repeat A1C in 3 months.  ?- POCT HgB A1C ?- Semaglutide, 2 MG/DOSE, 8 MG/3ML SOPN; Inject 2 mg as directed once a week.  Dispense: 9 mL; Refill: 1 ? ?3. BMI 29.0-29.9,adult ?Ozempic dose increased which may help her lose weight. She has gained about 7 lbs since her previous office visit.  ? ? ?General  Counseling: Zunaira verbalizes understanding of the findings of todays visit and agrees with plan of treatment. I have discussed any further diagnostic evaluation that may be needed or ordered today. We also reviewed her medications today. she has been encouraged to call the office with any questions or concerns that should arise related to todays visit. ? ? ? ?Orders Placed This Encounter  ?Procedures  ? POCT HgB A1C  ? ? ?Meds ordered this encounter  ?Medications  ? azithromycin (ZITHROMAX) 250 MG tablet  ?  Sig: Take 2 tablets on day 1, then 1 tablet daily on days 2 through 5  ?  Dispense:  6 tablet  ?  Refill:  0  ? Semaglutide, 2 MG/DOSE, 8 MG/3ML SOPN  ?  Sig: Inject 2 mg as directed once a week.  ?  Dispense:  9 mL  ?  Refill:  1  ?  Note increased dose, please discontinue '1mg'$  ozempic dose.  ? ? ?Return in about 18 weeks (around 10/15/2021) for CPE, Recheck A1C, Gail Creekmore PCP. ? ? ?Total time spent:30 Minutes ?Time spent includes review of chart, medications, test results, and follow up plan with the patient.  ? ?[place] Controlled Substance Database was reviewed by me. ? ?This patient was seen by Jonetta Osgood, FNP-C in collaboration with Dr. Clayborn Bigness as a part of collaborative care agreement. ? ? ?Jasslyn Finkel R. Valetta Fuller, MSN, FNP-C ?Internal medicine  ?

## 2021-06-13 ENCOUNTER — Encounter: Payer: Self-pay | Admitting: Nurse Practitioner

## 2021-07-06 ENCOUNTER — Other Ambulatory Visit: Payer: Self-pay

## 2021-07-06 DIAGNOSIS — E1165 Type 2 diabetes mellitus with hyperglycemia: Secondary | ICD-10-CM

## 2021-07-06 MED ORDER — METFORMIN HCL ER 500 MG PO TB24: ORAL_TABLET | ORAL | 3 refills | Status: DC

## 2021-07-18 ENCOUNTER — Encounter: Payer: Self-pay | Admitting: Nurse Practitioner

## 2021-08-11 ENCOUNTER — Telehealth: Payer: Self-pay

## 2021-08-11 ENCOUNTER — Other Ambulatory Visit: Payer: Self-pay | Admitting: Internal Medicine

## 2021-08-11 DIAGNOSIS — R928 Other abnormal and inconclusive findings on diagnostic imaging of breast: Secondary | ICD-10-CM

## 2021-08-11 NOTE — Telephone Encounter (Signed)
I ordered however img code might not be right, can u pls confirm

## 2021-08-16 NOTE — Telephone Encounter (Signed)
Done, thank u, that helps

## 2021-08-30 ENCOUNTER — Ambulatory Visit
Admission: RE | Admit: 2021-08-30 | Discharge: 2021-08-30 | Disposition: A | Discharge status: Home / Self Care | Payer: BC Managed Care – PPO | Source: Ambulatory Visit | Attending: Internal Medicine | Admitting: Internal Medicine

## 2021-08-30 DIAGNOSIS — R928 Other abnormal and inconclusive findings on diagnostic imaging of breast: Secondary | ICD-10-CM

## 2021-10-15 ENCOUNTER — Encounter: Payer: Self-pay | Admitting: Nurse Practitioner

## 2021-10-15 ENCOUNTER — Ambulatory Visit (INDEPENDENT_AMBULATORY_CARE_PROVIDER_SITE_OTHER): Payer: BC Managed Care – PPO | Admitting: Nurse Practitioner

## 2021-10-15 VITALS — BP 120/84 | Temp 98.5°F | Resp 16 | Ht 64.0 in | Wt 167.0 lb

## 2021-10-15 DIAGNOSIS — E1165 Type 2 diabetes mellitus with hyperglycemia: Secondary | ICD-10-CM | POA: Diagnosis not present

## 2021-10-15 DIAGNOSIS — E559 Vitamin D deficiency, unspecified: Secondary | ICD-10-CM

## 2021-10-15 DIAGNOSIS — E782 Mixed hyperlipidemia: Secondary | ICD-10-CM | POA: Diagnosis not present

## 2021-10-15 DIAGNOSIS — R928 Other abnormal and inconclusive findings on diagnostic imaging of breast: Secondary | ICD-10-CM

## 2021-10-15 DIAGNOSIS — Z1231 Encounter for screening mammogram for malignant neoplasm of breast: Secondary | ICD-10-CM

## 2021-10-15 DIAGNOSIS — Z760 Encounter for issue of repeat prescription: Secondary | ICD-10-CM

## 2021-10-15 DIAGNOSIS — E538 Deficiency of other specified B group vitamins: Secondary | ICD-10-CM | POA: Diagnosis not present

## 2021-10-15 DIAGNOSIS — Z0001 Encounter for general adult medical examination with abnormal findings: Secondary | ICD-10-CM

## 2021-10-15 DIAGNOSIS — J3089 Other allergic rhinitis: Secondary | ICD-10-CM

## 2021-10-15 DIAGNOSIS — R300 Dysuria: Secondary | ICD-10-CM

## 2021-10-15 LAB — POCT GLYCOSYLATED HEMOGLOBIN (HGB A1C): Hemoglobin A1C: 7.1 % — AB (ref 4.0–5.6)

## 2021-10-15 MED ORDER — METFORMIN HCL ER 500 MG PO TB24
ORAL_TABLET | ORAL | 3 refills | Status: DC
Start: 2021-10-15 — End: 2022-10-21

## 2021-10-15 MED ORDER — MONTELUKAST SODIUM 10 MG PO TABS: 10.0000 mg | ORAL_TABLET | Freq: Every day | ORAL | 3 refills | Status: DC

## 2021-10-15 MED ORDER — SEMAGLUTIDE (2 MG/DOSE) 8 MG/3ML ~~LOC~~ SOPN: 2.0000 mg | PEN_INJECTOR | SUBCUTANEOUS | 1 refills | Status: DC

## 2021-10-15 MED ORDER — VENLAFAXINE HCL ER 75 MG PO CP24: 75.0000 mg | ORAL_CAPSULE | Freq: Every day | ORAL | 3 refills | Status: DC

## 2021-10-15 MED ORDER — LORATADINE 10 MG PO TABS: 10.0000 mg | ORAL_TABLET | Freq: Every day | ORAL | 3 refills | Status: DC

## 2021-10-15 NOTE — Progress Notes (Signed)
[facility] [place], [place] [address]  Internal MEDICINE  Office Visit Note  Patient Name: Mandy Shea  [id number]  [id number]  Date of Service: 10/15/2021  Chief Complaint  Patient presents with   Annual Exam   Diabetes   Medication Reaction    Possible reaction to Dupixent - pt has been having a lasting cough, especially at night + mold was found at job site   Morris Exam    HPI Mandy Shea presents for an annual well visit and physical exam.  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  --A1c 7.1, elevated by 0.3.  --routine labs and diagnostic mammogram due.  --colonoscopy was done in November last year, due to repeat in 10 years.  No significant change in diet or lifestyle.  BP and other vital signs are normal.    Current Medication: Outpatient Encounter Medications as of 10/15/2021  Medication Sig   Dupilumab (DUPIXENT [place]) Inject 300 mg into the skin.   [DISCONTINUED] loratadine (CLARITIN) 10 MG tablet TAKE 1 TABLET BY MOUTH DAILY   [DISCONTINUED] metFORMIN (GLUCOPHAGE-XR) 500 MG 24 hr tablet TAKE 1 TABLET BY MOUTH EVERY DAY FOR DIABETES   [DISCONTINUED] montelukast (SINGULAIR) 10 MG tablet Take 1 tablet (10 mg total) by mouth daily.   [DISCONTINUED] Semaglutide, 2 MG/DOSE, 8 MG/3ML SOPN Inject 2 mg as directed once a week.   [DISCONTINUED] Sod Picosulfate-Mag Ox-Cit Acd (CLENPIQ) 10-3.5-12 MG-GM -GM/160ML SOLN Take 1 bottle at 5 PM followed by five 8 oz cups of water and repeat 5 hours before procedure.   [DISCONTINUED] venlafaxine XR (EFFEXOR-XR) 75 MG 24 hr capsule TAKE 1 CAPSULE BY MOUTH DAILY WITH BREAKFAST   loratadine (CLARITIN) 10 MG tablet Take 1 tablet (10 mg total) by mouth daily.    metFORMIN (GLUCOPHAGE-XR) 500 MG 24 hr tablet TAKE 1 TABLET BY MOUTH EVERY DAY FOR DIABETES   montelukast (SINGULAIR) 10 MG tablet Take 1 tablet (10 mg total) by mouth daily.   Semaglutide, 2 MG/DOSE, 8 MG/3ML SOPN Inject 2 mg as directed once a week.   venlafaxine XR (EFFEXOR-XR) 75 MG 24 hr capsule Take 1 capsule (75 mg total) by mouth daily with breakfast.   No facility-administered encounter medications on file as of 10/15/2021.    Surgical History: Past Surgical History:  Procedure Laterality Date   CHOLECYSTECTOMY  09-26-13   COLONOSCOPY WITH PROPOFOL N/A 01/05/2021   Procedure: COLONOSCOPY WITH PROPOFOL;  Surgeon: Jonathon Bellows, MD;  Location: [facility] ENDOSCOPY;  Service: Gastroenterology;  Laterality: N/A;   CYSTOSCOPY N/A 01/12/2017   Procedure: CYSTOSCOPY;  Surgeon: Malachy Mood, MD;  Location: [facility] ORS;  Service: Gynecology;  Laterality: N/A;   HYSTEROSCOPY WITH D & C  11/24/2016   Procedure: DILATATION AND CURETTAGE /HYSTEROSCOPY;  Surgeon: Malachy Mood, MD;  Location: [facility] ORS;  Service: Gynecology;;   KNEE SURGERY Right    LEEP  2010   [facility]   TONSILLECTOMY  2014   TOTAL LAPAROSCOPIC HYSTERECTOMY WITH SALPINGECTOMY Right 01/12/2017   Procedure: TOTAL LAPAROSCOPIC HYSTERECTOMY WITH RIGHT SALPINGECTOMY;  Surgeon: Malachy Mood, MD;  Location: [facility] ORS;  Service: Gynecology;  Laterality: Right;   TUBAL LIGATION  1997    Medical History: Past Medical History:  Diagnosis Date   Anemia  AS A CHILD   Diabetes mellitus without complication (HCC)    Fibroids    Hemorrhoids    Seasonal allergies    Sleep apnea    NO CPAP    Family History: Family History  Problem Relation Age of Onset   Diabetes Sister    Breast cancer Neg Hx     Social History   Socioeconomic History   Marital status: Married    Spouse name: Not on file   Number of children: Not on file   Years of education: Not on file   Highest education level: Not on file  Occupational History    Not on file  Tobacco Use   Smoking status: Never   Smokeless tobacco: Never  Vaping Use   Vaping Use: Never used  Substance and Sexual Activity   Alcohol use: No   Drug use: No   Sexual activity: Yes    Birth control/protection: None  Other Topics Concern   Not on file  Social History Narrative   Not on file   Social Determinants of Health   Financial Resource Strain: Not on file  Food Insecurity: Not on file  Transportation Needs: Not on file  Physical Activity: Not on file  Stress: Not on file  Social Connections: Not on file  Intimate Partner Violence: Not on file      Review of Systems  Constitutional:  Negative for activity change, appetite change, chills, fatigue, fever and unexpected weight change.  HENT: Negative.  Negative for congestion, ear pain, rhinorrhea, sore throat and trouble swallowing.   Eyes: Negative.   Respiratory: Negative.  Negative for cough, chest tightness, shortness of breath and wheezing.   Cardiovascular: Negative.  Negative for chest pain.  Gastrointestinal: Negative.  Negative for abdominal pain, blood in stool, constipation, diarrhea, nausea and vomiting.  Endocrine: Negative.   Genitourinary: Negative.  Negative for difficulty urinating, dysuria, frequency, hematuria and urgency.  Musculoskeletal: Negative.  Negative for arthralgias, back pain, joint swelling, myalgias and neck pain.  Skin: Negative.  Negative for rash and wound.  Allergic/Immunologic: Negative.  Negative for immunocompromised state.  Neurological: Negative.  Negative for dizziness, seizures, numbness and headaches.  Hematological: Negative.   Psychiatric/Behavioral: Negative.  Negative for behavioral problems, self-injury and suicidal ideas. The patient is not nervous/anxious.     Vital Signs: BP 120/84   Temp 98.5 F (36.9 C)   Resp 16   Ht _0  (1.626 m)   Wt 167 lb (75.8 kg)   LMP 12/28/2016   SpO2 96%   BMI 28.67 kg/m    Physical Exam Vitals  reviewed.  Constitutional:      General: She is awake. She is not in acute distress.    Appearance: Normal appearance. She is well-developed and well-groomed. She is obese. She is not diaphoretic.  HENT:     Head: Normocephalic and atraumatic.     Right Ear: Tympanic membrane, ear canal and external ear normal. There is no impacted cerumen.     Left Ear: Tympanic membrane, ear canal and external ear normal. There is no impacted cerumen.     Nose: Nose normal. No congestion or rhinorrhea.     Mouth/Throat:     Lips: Pink.     Mouth: Mucous membranes are moist.     Pharynx: Oropharynx is clear. Uvula midline. No oropharyngeal exudate or posterior oropharyngeal erythema.  Eyes:     General: Lids are normal. Vision grossly intact. Gaze aligned appropriately. No scleral icterus.  Right eye: No discharge.        Left eye: No discharge.     Extraocular Movements: Extraocular movements intact.     Conjunctiva/sclera: Conjunctivae normal.     Pupils: Pupils are equal, round, and reactive to light.     Funduscopic exam:    Right eye: Red reflex present.        Left eye: Red reflex present. Neck:     Thyroid: No thyromegaly.     Vascular: No JVD.     Trachea: Trachea and phonation normal. No tracheal deviation.  Cardiovascular:     Rate and Rhythm: Normal rate and regular rhythm.     Pulses: Normal pulses.     Heart sounds: Normal heart sounds, S1 normal and S2 normal. No murmur heard.    No friction rub. No gallop.  Pulmonary:     Effort: Pulmonary effort is normal. No accessory muscle usage or respiratory distress.     Breath sounds: Normal breath sounds and air entry. No stridor. No wheezing or rales.  Chest:     Chest wall: No tenderness.  Breasts:    Right: Normal.     Left: Normal.  Abdominal:     General: Bowel sounds are normal. There is no distension.     Palpations: Abdomen is soft. There is no shifting dullness, fluid wave, mass or pulsatile mass.     Tenderness: There  is no abdominal tenderness. There is no guarding or rebound.  Musculoskeletal:        General: No tenderness or deformity. Normal range of motion.     Cervical back: Normal range of motion and neck supple.     Right lower leg: No edema.     Left lower leg: No edema.  Lymphadenopathy:     Cervical: No cervical adenopathy.  Skin:    General: Skin is warm and dry.     Capillary Refill: Capillary refill takes less than 2 seconds.     Coloration: Skin is not pale.     Findings: No erythema or rash.  Neurological:     Mental Status: She is alert and oriented to person, place, and time.     Cranial Nerves: No cranial nerve deficit.     Motor: No abnormal muscle tone.     Coordination: Coordination normal.     Gait: Gait normal.     Deep Tendon Reflexes: Reflexes are normal and symmetric.  Psychiatric:        Mood and Affect: Mood normal.        Behavior: Behavior normal. Behavior is cooperative.        Thought Content: Thought content normal.        Judgment: Judgment normal.        Assessment/Plan: 1. Encounter for general adult medical examination with abnormal findings Age-appropriate preventive screenings and vaccinations discussed, annual physical exam completed. Routine labs for health maintenance ordered, see below. PHM updated.   2. Type 2 diabetes mellitus with hyperglycemia, without long-term current use of insulin (HCC) A1c elevated, working on diet and lifestyle modifications, continue medications as prescribed. Repeat a1c in 3 months - POCT HgB A1C - CMP14+EGFR - TSH + free T4 - Semaglutide, 2 MG/DOSE, 8 MG/3ML SOPN; Inject 2 mg as directed once a week.  Dispense: 9 mL; Refill: 1 - metFORMIN (GLUCOPHAGE-XR) 500 MG 24 hr tablet; TAKE 1 TABLET BY MOUTH EVERY DAY FOR DIABETES  Dispense: 90 tablet; Refill: 3  3. Mixed hyperlipidemia Routine lab ordered -  CMP14+EGFR - Lipid Profile  4. Vitamin D deficiency Routine lab ordered - Vitamin D (25 hydroxy)  5. B12  deficiency Routine labs ordered - B12 and Folate Panel - CBC with Differential/Platelet  6. Abnormal finding on mammography - MM DIAG BREAST TOMO BILATERAL; Future  7. Dysuria Routine urinalysis done - UA/M w/rflx Culture, Routine - Microscopic Examination - Urine Culture, Reflex  8. Medication refill - Semaglutide, 2 MG/DOSE, 8 MG/3ML SOPN; Inject 2 mg as directed once a week.  Dispense: 9 mL; Refill: 1 - venlafaxine XR (EFFEXOR-XR) 75 MG 24 hr capsule; Take 1 capsule (75 mg total) by mouth daily with breakfast.  Dispense: 90 capsule; Refill: 3 - montelukast (SINGULAIR) 10 MG tablet; Take 1 tablet (10 mg total) by mouth daily.  Dispense: 90 tablet; Refill: 3 - metFORMIN (GLUCOPHAGE-XR) 500 MG 24 hr tablet; TAKE 1 TABLET BY MOUTH EVERY DAY FOR DIABETES  Dispense: 90 tablet; Refill: 3 - loratadine (CLARITIN) 10 MG tablet; Take 1 tablet (10 mg total) by mouth daily.  Dispense: 90 tablet; Refill: 3 - albuterol (VENTOLIN HFA) 108 (90 Base) MCG/ACT inhaler; Inhale 2 puffs into the lungs every 6 (six) hours as needed for wheezing or shortness of breath.  Dispense: 8 g; Refill: 2      General Counseling: Shanta verbalizes understanding of the findings of todays visit and agrees with plan of treatment. I have discussed any further diagnostic evaluation that may be needed or ordered today. We also reviewed her medications today. she has been encouraged to call the office with any questions or concerns that should arise related to todays visit.    Orders Placed This Encounter  Procedures   MM DIAG BREAST TOMO BILATERAL   UA/M w/rflx Culture, Routine   B12 and Folate Panel   CBC with Differential/Platelet   CMP14+EGFR   Lipid Profile   TSH + free T4   Vitamin D (25 hydroxy)   POCT HgB A1C    Meds ordered this encounter  Medications   Semaglutide, 2 MG/DOSE, 8 MG/3ML SOPN    Sig: Inject 2 mg as directed once a week.    Dispense:  9 mL    Refill:  1    Note increased dose, please  discontinue 66m ozempic dose.   venlafaxine XR (EFFEXOR-XR) 75 MG 24 hr capsule    Sig: Take 1 capsule (75 mg total) by mouth daily with breakfast.    Dispense:  90 capsule    Refill:  3   montelukast (SINGULAIR) 10 MG tablet    Sig: Take 1 tablet (10 mg total) by mouth daily.    Dispense:  90 tablet    Refill:  3   metFORMIN (GLUCOPHAGE-XR) 500 MG 24 hr tablet    Sig: TAKE 1 TABLET BY MOUTH EVERY DAY FOR DIABETES    Dispense:  90 tablet    Refill:  3   loratadine (CLARITIN) 10 MG tablet    Sig: Take 1 tablet (10 mg total) by mouth daily.    Dispense:  90 tablet    Refill:  3    Return in about 3 months (around 01/15/2022) for F/U, Recheck A1C, Stevie Ertle PCP.   Total time spent:30 Minutes Time spent includes review of chart, medications, test results, and follow up plan with the patient.   [place] Controlled Substance Database was reviewed by me.  This patient was seen by AJonetta Osgood FNP-C in collaboration with Dr. FClayborn Bignessas a part of collaborative care agreement.  Steele Ledonne R. Ruberta Holck,  MSN, FNP-C Internal medicine

## 2021-10-18 ENCOUNTER — Telehealth: Payer: Self-pay

## 2021-10-18 ENCOUNTER — Other Ambulatory Visit: Payer: Self-pay

## 2021-10-18 LAB — UA/M W/RFLX CULTURE, ROUTINE
Bilirubin, UA: NEGATIVE
Nitrite, UA: NEGATIVE
Protein,UA: NEGATIVE
RBC, UA: NEGATIVE
Specific Gravity, UA: 1.028 (ref 1.005–1.030)
Urobilinogen, Ur: 1 mg/dL (ref 0.2–1.0)
pH, UA: 6 (ref 5.0–7.5)

## 2021-10-18 LAB — MICROSCOPIC EXAMINATION
Casts: NONE SEEN /lpf
Epithelial Cells (non renal): >10 /hpf — AB (ref 0–10)
RBC, Urine: NONE SEEN /hpf (ref 0–2)

## 2021-10-18 LAB — URINE CULTURE, REFLEX

## 2021-10-18 MED ORDER — ALBUTEROL SULFATE HFA 108 (90 BASE) MCG/ACT IN AERS: 2.0000 | INHALATION_SPRAY | Freq: Four times a day (QID) | RESPIRATORY_TRACT | 2 refills | Status: AC | PRN

## 2021-10-18 NOTE — Telephone Encounter (Signed)
Lmom Mandy Shea send albuterol to [facility] and urine is normal

## 2021-10-18 NOTE — Telephone Encounter (Signed)
Pt advised that urine is normal and pt don't have nay symptoms for UTI advised her to drink plenty of water

## 2021-10-20 ENCOUNTER — Other Ambulatory Visit: Payer: Self-pay | Admitting: Nurse Practitioner

## 2021-10-20 DIAGNOSIS — R928 Other abnormal and inconclusive findings on diagnostic imaging of breast: Secondary | ICD-10-CM

## 2021-10-20 DIAGNOSIS — Z1231 Encounter for screening mammogram for malignant neoplasm of breast: Secondary | ICD-10-CM

## 2021-10-20 LAB — CMP14+EGFR
ALT: 20 IU/L (ref 0–32)
AST: 18 IU/L (ref 0–40)
Albumin/Globulin Ratio: 1.8 (ref 1.2–2.2)
Albumin: 4.4 g/dL (ref 3.8–4.9)
Alkaline Phosphatase: 91 IU/L (ref 44–121)
BUN/Creatinine Ratio: 16 (ref 9–23)
BUN: 14 mg/dL (ref 6–24)
Bilirubin Total: 0.4 mg/dL (ref 0.0–1.2)
CO2: 25 mmol/L (ref 20–29)
Calcium: 9.8 mg/dL (ref 8.7–10.2)
Chloride: 100 mmol/L (ref 96–106)
Creatinine, Ser: 0.9 mg/dL (ref 0.57–1.00)
Globulin, Total: 2.4 g/dL (ref 1.5–4.5)
Glucose: 152 mg/dL — ABNORMAL HIGH (ref 70–99)
Potassium: 4.3 mmol/L (ref 3.5–5.2)
Sodium: 140 mmol/L (ref 134–144)
Total Protein: 6.8 g/dL (ref 6.0–8.5)
eGFR: 76 mL/min/{1.73_m2} (ref >59)

## 2021-10-20 LAB — LIPID PANEL
Chol/HDL Ratio: 4.2 ratio (ref 0.0–4.4)
Cholesterol, Total: 183 mg/dL (ref 100–199)
HDL: 44 mg/dL (ref >39)
LDL Chol Calc (NIH): 114 mg/dL — ABNORMAL HIGH (ref 0–99)
Triglycerides: 142 mg/dL (ref 0–149)
VLDL Cholesterol Cal: 25 mg/dL (ref 5–40)

## 2021-10-20 LAB — CBC WITH DIFFERENTIAL/PLATELET
Basophils Absolute: 0 10*3/uL (ref 0.0–0.2)
Basos: 1 %
EOS (ABSOLUTE): 0.1 10*3/uL (ref 0.0–0.4)
Eos: 2 %
Hematocrit: 38.6 % (ref 34.0–46.6)
Hemoglobin: 13.4 g/dL (ref 11.1–15.9)
Immature Grans (Abs): 0 10*3/uL (ref 0.0–0.1)
Immature Granulocytes: 0 %
Lymphocytes Absolute: 1.3 10*3/uL (ref 0.7–3.1)
Lymphs: 33 %
MCH: 30.4 pg (ref 26.6–33.0)
MCHC: 34.7 g/dL (ref 31.5–35.7)
MCV: 88 fL (ref 79–97)
Monocytes Absolute: 0.2 10*3/uL (ref 0.1–0.9)
Monocytes: 6 %
Neutrophils Absolute: 2.3 10*3/uL (ref 1.4–7.0)
Neutrophils: 58 %
Platelets: 293 10*3/uL (ref 150–450)
RBC: 4.41 x10E6/uL (ref 3.77–5.28)
RDW: 11.3 % — ABNORMAL LOW (ref 11.7–15.4)
WBC: 4 10*3/uL (ref 3.4–10.8)

## 2021-10-20 LAB — B12 AND FOLATE PANEL
Folate: 15.6 ng/mL (ref >3.0)
Vitamin B-12: 1743 pg/mL — ABNORMAL HIGH (ref 232–1245)

## 2021-10-20 LAB — TSH+FREE T4
Free T4: 0.96 ng/dL (ref 0.82–1.77)
TSH: 2.1 u[IU]/mL (ref 0.450–4.500)

## 2021-10-20 LAB — VITAMIN D 25 HYDROXY (VIT D DEFICIENCY, FRACTURES): Vit D, 25-Hydroxy: 30.7 ng/mL (ref 30.0–100.0)

## 2021-11-11 ENCOUNTER — Other Ambulatory Visit: Payer: Self-pay

## 2021-11-11 ENCOUNTER — Telehealth: Payer: Self-pay

## 2021-11-11 MED ORDER — FLUCONAZOLE 150 MG PO TABS: ORAL_TABLET | ORAL | 0 refills | Status: DC

## 2021-11-11 NOTE — Telephone Encounter (Signed)
Patient called requesting Diflucan for yeast infection. Sending Diflucan to [facility] [facility] per [employer].

## 2021-11-16 ENCOUNTER — Ambulatory Visit
Admission: RE | Admit: 2021-11-16 | Discharge: 2021-11-16 | Disposition: A | Discharge status: Home / Self Care | Payer: BC Managed Care – PPO | Source: Ambulatory Visit | Attending: Nurse Practitioner | Admitting: Nurse Practitioner

## 2021-11-16 DIAGNOSIS — R928 Other abnormal and inconclusive findings on diagnostic imaging of breast: Secondary | ICD-10-CM | POA: Insufficient documentation

## 2021-11-16 DIAGNOSIS — Z1231 Encounter for screening mammogram for malignant neoplasm of breast: Secondary | ICD-10-CM | POA: Insufficient documentation

## 2021-11-23 ENCOUNTER — Encounter: Payer: Self-pay | Admitting: Nurse Practitioner

## 2021-11-23 ENCOUNTER — Telehealth (INDEPENDENT_AMBULATORY_CARE_PROVIDER_SITE_OTHER): Payer: BC Managed Care – PPO | Admitting: Nurse Practitioner

## 2021-11-23 VITALS — HR 110 | Temp 98.2°F | Resp 16

## 2021-11-23 DIAGNOSIS — R051 Acute cough: Secondary | ICD-10-CM

## 2021-11-23 DIAGNOSIS — B379 Candidiasis, unspecified: Secondary | ICD-10-CM | POA: Diagnosis not present

## 2021-11-23 DIAGNOSIS — J208 Acute bronchitis due to other specified organisms: Secondary | ICD-10-CM

## 2021-11-23 DIAGNOSIS — B9689 Other specified bacterial agents as the cause of diseases classified elsewhere: Secondary | ICD-10-CM

## 2021-11-23 MED ORDER — HYDROCOD POLI-CHLORPHE POLI ER 10-8 MG/5ML PO SUER: 5.0000 mL | Freq: Two times a day (BID) | ORAL | 0 refills | Status: DC

## 2021-11-23 MED ORDER — FLUCONAZOLE 150 MG PO TABS: 150.0000 mg | ORAL_TABLET | Freq: Once | ORAL | 0 refills | Status: AC

## 2021-11-23 MED ORDER — LEVOFLOXACIN 500 MG PO TABS: 500.0000 mg | ORAL_TABLET | Freq: Every day | ORAL | 0 refills | Status: AC

## 2021-11-23 NOTE — Progress Notes (Signed)
[facility] Mandy Shea, [place] [address]  Internal MEDICINE  Telephone Visit  Patient Name: Mandy Shea  [id number]  [id number]  Date of Service: 11/23/2021  I connected with the patient at 1550 by telephone and verified the patients identity using two identifiers.   I discussed the limitations, risks, security and privacy concerns of performing an evaluation and management service by telephone and the availability of in person appointments. I also discussed with the patient that there may be a patient responsible charge related to the service.  The patient expressed understanding and agrees to proceed.    Chief Complaint  Patient presents with   Telephone Assessment    [phone]   Telephone Screen    Negative for Covid   Sore Throat   Cough    Causing chest pain   Nasal Congestion    HPI Mandy Shea presents for a telehealth virtual visit for symptoms of sinusitis/possible bronchitis. Negative for covid Has sore throat, deep cough causing chest wall pain, nasal congestion    Current Medication: Outpatient Encounter Medications as of 11/23/2021  Medication Sig   albuterol (VENTOLIN HFA) 108 (90 Base) MCG/ACT inhaler Inhale 2 puffs into the lungs every 6 (six) hours as needed for wheezing or shortness of breath.   chlorpheniramine-HYDROcodone (TUSSIONEX) 10-8 MG/5ML Take 5 mLs by mouth 2 (two) times daily.   Dupilumab (DUPIXENT [place]) Inject 300 mg into the skin.   fluconazole (DIFLUCAN) 150 MG tablet Take 1 tab po once may repeat in 3 days if symptoms persist   fluconazole (DIFLUCAN) 150 MG tablet Take 1 tablet (150 mg total) by mouth once for 1 dose. May take an additional dose after 3 days if still symptomatic.   levofloxacin (LEVAQUIN) 500 MG tablet Take 1 tablet (500 mg total) by mouth daily for 7 days.   loratadine (CLARITIN) 10 MG tablet Take 1 tablet (10 mg total) by mouth daily.   metFORMIN (GLUCOPHAGE-XR) 500 MG 24 hr tablet TAKE 1 TABLET BY  MOUTH EVERY DAY FOR DIABETES   montelukast (SINGULAIR) 10 MG tablet Take 1 tablet (10 mg total) by mouth daily.   Semaglutide, 2 MG/DOSE, 8 MG/3ML SOPN Inject 2 mg as directed once a week.   venlafaxine XR (EFFEXOR-XR) 75 MG 24 hr capsule Take 1 capsule (75 mg total) by mouth daily with breakfast.   No facility-administered encounter medications on file as of 11/23/2021.    Surgical History: Past Surgical History:  Procedure Laterality Date   CHOLECYSTECTOMY  09-26-13   COLONOSCOPY WITH PROPOFOL N/A 01/05/2021   Procedure: COLONOSCOPY WITH PROPOFOL;  Surgeon: Jonathon Bellows, MD;  Location: [facility] ENDOSCOPY;  Service: Gastroenterology;  Laterality: N/A;   CYSTOSCOPY N/A 01/12/2017   Procedure: CYSTOSCOPY;  Surgeon: Malachy Mood, MD;  Location: [facility] ORS;  Service: Gynecology;  Laterality: N/A;   HYSTEROSCOPY WITH D & C  11/24/2016   Procedure: DILATATION AND CURETTAGE /HYSTEROSCOPY;  Surgeon: Malachy Mood, MD;  Location: [facility] ORS;  Service: Gynecology;;   KNEE SURGERY Right    LEEP  2010   [facility]   TONSILLECTOMY  2014   TOTAL LAPAROSCOPIC HYSTERECTOMY WITH SALPINGECTOMY Right 01/12/2017   Procedure: TOTAL LAPAROSCOPIC HYSTERECTOMY WITH RIGHT SALPINGECTOMY;  Surgeon: Malachy Mood, MD;  Location: [facility] ORS;  Service: Gynecology;  Laterality: Right;   TUBAL LIGATION  1997    Medical History: Past Medical History:  Diagnosis Date   Anemia    AS A CHILD   Diabetes mellitus without complication (HCC)    Fibroids  Hemorrhoids    Seasonal allergies    Sleep apnea    NO CPAP    Family History: Family History  Problem Relation Age of Onset   Diabetes Sister    Breast cancer Neg Hx     Social History   Socioeconomic History   Marital status: Married    Spouse name: Not on file   Number of children: Not on file   Years of education: Not on file   Highest education level: Not on file  Occupational History   Not on file  Tobacco Use   Smoking status: Never    Smokeless tobacco: Never  Vaping Use   Vaping Use: Never used  Substance and Sexual Activity   Alcohol use: No   Drug use: No   Sexual activity: Yes    Birth control/protection: None  Other Topics Concern   Not on file  Social History Narrative   Not on file   Social Determinants of Health   Financial Resource Strain: Not on file  Food Insecurity: Not on file  Transportation Needs: Not on file  Physical Activity: Not on file  Stress: Not on file  Social Connections: Not on file  Intimate Partner Violence: Not on file      Review of Systems  Constitutional:  Positive for fatigue. Negative for chills and fever.  HENT:  Positive for congestion, postnasal drip and sore throat. Negative for ear pain, sinus pressure and sinus pain.   Respiratory:  Positive for cough.        Chest wall pain from coughing  Cardiovascular: Negative.  Negative for chest pain and palpitations.    Vital Signs: Pulse (!) 110   Temp 98.2 F (36.8 C)   Resp 16   LMP 12/28/2016   SpO2 98%    Observation/Objective: She is alert and oriented and engages in conversation appropriately. She does not appear to be in any acute distress over video call.     Assessment/Plan: 1. Acute bacterial bronchitis Empiric antibiotic treatment prescribed. Tussionex prescribed for cough. Please call clinic if no improvement or worsening of symptoms after at least 3 days of antibiotic.  - levofloxacin (LEVAQUIN) 500 MG tablet; Take 1 tablet (500 mg total) by mouth daily for 7 days.  Dispense: 7 tablet; Refill: 0 - chlorpheniramine-HYDROcodone (TUSSIONEX) 10-8 MG/5ML; Take 5 mLs by mouth 2 (two) times daily.  Dispense: 140 mL; Refill: 0  2. Acute cough Tussionex prescribed for symptomatic relief of cough - chlorpheniramine-HYDROcodone (TUSSIONEX) 10-8 MG/5ML; Take 5 mLs by mouth 2 (two) times daily.  Dispense: 140 mL; Refill: 0  3. Antibiotic-induced yeast infection Fluconazole prescribed as prophylactic.  -  fluconazole (DIFLUCAN) 150 MG tablet; Take 1 tablet (150 mg total) by mouth once for 1 dose. May take an additional dose after 3 days if still symptomatic.  Dispense: 3 tablet; Refill: 0   General Counseling: Mandy Shea verbalizes understanding of the findings of today's phone visit and agrees with plan of treatment. I have discussed any further diagnostic evaluation that may be needed or ordered today. We also reviewed her medications today. she has been encouraged to call the office with any questions or concerns that should arise related to todays visit.  Return if symptoms worsen or fail to improve.   No orders of the defined types were placed in this encounter.   Meds ordered this encounter  Medications   levofloxacin (LEVAQUIN) 500 MG tablet    Sig: Take 1 tablet (500 mg total) by mouth daily  for 7 days.    Dispense:  7 tablet    Refill:  0   chlorpheniramine-HYDROcodone (TUSSIONEX) 10-8 MG/5ML    Sig: Take 5 mLs by mouth 2 (two) times daily.    Dispense:  140 mL    Refill:  0   fluconazole (DIFLUCAN) 150 MG tablet    Sig: Take 1 tablet (150 mg total) by mouth once for 1 dose. May take an additional dose after 3 days if still symptomatic.    Dispense:  3 tablet    Refill:  0    Time spent:10 Minutes Time spent with patient included reviewing progress notes, labs, imaging studies, and discussing plan for follow up.  [place] Controlled Substance Database was reviewed by me for overdose risk score (ORS) if appropriate.  This patient was seen by Jonetta Osgood, FNP-C in collaboration with Dr. Clayborn Bigness as a part of collaborative care agreement.  Claborn Janusz R. Valetta Fuller, MSN, FNP-C Internal medicine

## 2021-12-07 ENCOUNTER — Ambulatory Visit (INDEPENDENT_AMBULATORY_CARE_PROVIDER_SITE_OTHER): Payer: BC Managed Care – PPO | Admitting: Nurse Practitioner

## 2021-12-07 ENCOUNTER — Encounter: Payer: Self-pay | Admitting: Nurse Practitioner

## 2021-12-07 VITALS — BP 139/90 | HR 88 | Temp 95.4°F | Resp 16 | Ht 64.0 in | Wt 161.4 lb

## 2021-12-07 DIAGNOSIS — R1114 Bilious vomiting: Secondary | ICD-10-CM

## 2021-12-07 DIAGNOSIS — R051 Acute cough: Secondary | ICD-10-CM

## 2021-12-07 DIAGNOSIS — K2970 Gastritis, unspecified, without bleeding: Secondary | ICD-10-CM

## 2021-12-07 DIAGNOSIS — B9689 Other specified bacterial agents as the cause of diseases classified elsewhere: Secondary | ICD-10-CM

## 2021-12-07 MED ORDER — HYDROCOD POLI-CHLORPHE POLI ER 10-8 MG/5ML PO SUER: 5.0000 mL | Freq: Two times a day (BID) | ORAL | 0 refills | Status: DC

## 2021-12-07 MED ORDER — PROMETHAZINE HCL 25 MG PO TABS: 25.0000 mg | ORAL_TABLET | Freq: Three times a day (TID) | ORAL | 0 refills | Status: DC | PRN

## 2021-12-07 NOTE — Progress Notes (Signed)
[facility] [place], [place] [address]  Internal MEDICINE  Office Visit Note  Patient Name: Mandy Shea  [id number]  [id number]  Date of Service: 01/15/2022  Chief Complaint  Patient presents with   Acute Visit    Nausea and vomiting. 2 covid at home test neg.      HPI Adalei presents for an acute sick visit for upper respiratory infection Started at 1 pm today,  --Reports symptoms of Nausea, vomiting, Headache, fatigue, chills, cough, some lightheadedness and dizziness --Denies any fever, body aches, diarrhea, vertigo, SOB, chest tightness, or wheezing.  --Lost 6 lbs, Vomited 4x today, No blood in emesis   Current Medication:  Outpatient Encounter Medications as of 12/07/2021  Medication Sig   albuterol (VENTOLIN HFA) 108 (90 Base) MCG/ACT inhaler Inhale 2 puffs into the lungs every 6 (six) hours as needed for wheezing or shortness of breath.   Dupilumab (DUPIXENT [place]) Inject 300 mg into the skin.   fluconazole (DIFLUCAN) 150 MG tablet Take 1 tab po once may repeat in 3 days if symptoms persist   loratadine (CLARITIN) 10 MG tablet Take 1 tablet (10 mg total) by mouth daily.   metFORMIN (GLUCOPHAGE-XR) 500 MG 24 hr tablet TAKE 1 TABLET BY MOUTH EVERY DAY FOR DIABETES   montelukast (SINGULAIR) 10 MG tablet Take 1 tablet (10 mg total) by mouth daily.   promethazine (PHENERGAN) 25 MG tablet Take 1 tablet (25 mg total) by mouth every 8 (eight) hours as needed for nausea or vomiting.   Semaglutide, 2 MG/DOSE, 8 MG/3ML SOPN Inject 2 mg as directed once a week.   venlafaxine XR (EFFEXOR-XR) 75 MG 24 hr capsule Take 1 capsule (75 mg total) by mouth daily with breakfast.   [DISCONTINUED] chlorpheniramine-HYDROcodone (TUSSIONEX) 10-8 MG/5ML Take 5 mLs by mouth 2 (two) times daily.   chlorpheniramine-HYDROcodone (TUSSIONEX) 10-8 MG/5ML Take 5 mLs by mouth 2 (two) times daily.   No facility-administered encounter medications on file as of 12/07/2021.       Medical History: Past Medical History:  Diagnosis Date   Anemia    AS A CHILD   Diabetes mellitus without complication (HCC)    Fibroids    Hemorrhoids    Seasonal allergies    Sleep apnea    NO CPAP     Vital Signs: BP (!) 139/90   Pulse 88   Temp (!) 95.4 F (35.2 C)   Resp 16   Ht '5\' 4"'$  (1.626 m)   Wt 161 lb 6.4 oz (73.2 kg)   LMP 12/28/2016   SpO2 98%   BMI 27.70 kg/m    Review of Systems  Constitutional:  Positive for appetite change, chills and fatigue. Negative for fever.  HENT:  Positive for postnasal drip. Negative for congestion, rhinorrhea, sinus pressure, sinus pain, sneezing and sore throat.   Respiratory:  Positive for cough. Negative for chest tightness, shortness of breath and wheezing.   Cardiovascular: Negative.  Negative for chest pain and palpitations.  Gastrointestinal:  Positive for nausea and vomiting. Negative for abdominal distention, abdominal pain and diarrhea.  Musculoskeletal:  Negative for myalgias.  Neurological:  Positive for dizziness, light-headedness and headaches.    Physical Exam Constitutional:      General: She is not in acute distress.    Appearance: Normal appearance. She is obese. She is ill-appearing.  HENT:     Head: Normocephalic and atraumatic.     Right Ear: Tympanic membrane, ear canal and external ear normal.  Left Ear: Tympanic membrane, ear canal and external ear normal.     Nose: Mucosal edema, congestion and rhinorrhea present. Rhinorrhea is purulent.     Right Turbinates: Swollen and pale.     Left Turbinates: Swollen and pale.     Mouth/Throat:     Mouth: Mucous membranes are moist.     Pharynx: Posterior oropharyngeal erythema present.  Eyes:     General: Lids are normal. Vision grossly intact. Gaze aligned appropriately.     Pupils: Pupils are equal, round, and reactive to light.  Cardiovascular:     Rate and Rhythm: Normal rate and regular rhythm.     Heart sounds: No murmur  heard. Pulmonary:     Effort: Pulmonary effort is normal. No respiratory distress.     Breath sounds: Normal breath sounds. No wheezing.  Lymphadenopathy:     Cervical: Cervical adenopathy present.  Neurological:     Mental Status: She is alert.       Assessment/Plan: 1. Acute cough Medication prescribed for symptomatic relief of cough - chlorpheniramine-HYDROcodone (TUSSIONEX) 10-8 MG/5ML; Take 5 mLs by mouth 2 (two) times daily.  Dispense: 140 mL; Refill: 0  2. Bilious vomiting with nausea Prn medication prescribed to control nausea and vomiting.  - promethazine (PHENERGAN) 25 MG tablet; Take 1 tablet (25 mg total) by mouth every 8 (eight) hours as needed for nausea or vomiting.  Dispense: 20 tablet; Refill: 0   General Counseling: Deniqua verbalizes understanding of the findings of todays visit and agrees with plan of treatment. I have discussed any further diagnostic evaluation that may be needed or ordered today. We also reviewed her medications today. she has been encouraged to call the office with any questions or concerns that should arise related to todays visit.    Counseling:    No orders of the defined types were placed in this encounter.   Meds ordered this encounter  Medications   chlorpheniramine-HYDROcodone (TUSSIONEX) 10-8 MG/5ML    Sig: Take 5 mLs by mouth 2 (two) times daily.    Dispense:  140 mL    Refill:  0   promethazine (PHENERGAN) 25 MG tablet    Sig: Take 1 tablet (25 mg total) by mouth every 8 (eight) hours as needed for nausea or vomiting.    Dispense:  20 tablet    Refill:  0    Return if symptoms worsen or fail to improve.  [place] Controlled Substance Database was reviewed by me for overdose risk score (ORS)  Time spent:20 Minutes Time spent with patient included reviewing progress notes, labs, imaging studies, and discussing plan for follow up.   This patient was seen by Jonetta Osgood, FNP-C in collaboration with Dr. Clayborn Bigness as a  part of collaborative care agreement.  Kashon Kraynak R. Valetta Fuller, MSN, FNP-C Internal Medicine

## 2021-12-17 ENCOUNTER — Encounter: Payer: Self-pay | Admitting: Nurse Practitioner

## 2021-12-23 ENCOUNTER — Telehealth: Payer: Self-pay

## 2021-12-27 ENCOUNTER — Encounter: Payer: Self-pay | Admitting: Nurse Practitioner

## 2021-12-27 ENCOUNTER — Telehealth (INDEPENDENT_AMBULATORY_CARE_PROVIDER_SITE_OTHER): Payer: BC Managed Care – PPO | Admitting: Nurse Practitioner

## 2021-12-27 VITALS — Resp 16 | Ht 64.0 in

## 2021-12-27 DIAGNOSIS — B9689 Other specified bacterial agents as the cause of diseases classified elsewhere: Secondary | ICD-10-CM

## 2021-12-27 DIAGNOSIS — E1165 Type 2 diabetes mellitus with hyperglycemia: Secondary | ICD-10-CM

## 2021-12-27 DIAGNOSIS — Z6827 Body mass index (BMI) 27.0-27.9, adult: Secondary | ICD-10-CM

## 2021-12-27 DIAGNOSIS — E663 Overweight: Secondary | ICD-10-CM

## 2021-12-27 DIAGNOSIS — J208 Acute bronchitis due to other specified organisms: Secondary | ICD-10-CM

## 2021-12-27 MED ORDER — SEMAGLUTIDE-WEIGHT MANAGEMENT 2.4 MG/0.75ML ~~LOC~~ SOAJ: 2.4000 mg | SUBCUTANEOUS | 2 refills | Status: DC

## 2021-12-27 NOTE — Progress Notes (Signed)
[facility] [place], [place] [address]  Internal MEDICINE  Telephone Visit  Patient Name: Mandy Shea  [id number]  [id number]  Date of Service: 12/27/2021  I connected with the patient at 1525 by telephone and verified the patients identity using two identifiers.   I discussed the limitations, risks, security and privacy concerns of performing an evaluation and management service by telephone and the availability of in person appointments. I also discussed with the patient that there may be a patient responsible charge related to the service.  The patient expressed understanding and agrees to proceed.    Chief Complaint  Patient presents with   Telephone Screen    Without medication- Ozempic   Telephone Assessment    HPI Mandy Shea presents for a telehealth virtual visit for out of ozempic '2mg'$  dose, was told on back order until march next year.  --need alternative medication or samples for now while we figure out what to do.    Current Medication: Outpatient Encounter Medications as of 12/27/2021  Medication Sig   albuterol (VENTOLIN HFA) 108 (90 Base) MCG/ACT inhaler Inhale 2 puffs into the lungs every 6 (six) hours as needed for wheezing or shortness of breath.   Dupilumab (DUPIXENT [place]) Inject 300 mg into the skin.   loratadine (CLARITIN) 10 MG tablet Take 1 tablet (10 mg total) by mouth daily.   metFORMIN (GLUCOPHAGE-XR) 500 MG 24 hr tablet TAKE 1 TABLET BY MOUTH EVERY DAY FOR DIABETES   montelukast (SINGULAIR) 10 MG tablet Take 1 tablet (10 mg total) by mouth daily.   promethazine (PHENERGAN) 25 MG tablet Take 1 tablet (25 mg total) by mouth every 8 (eight) hours as needed for nausea or vomiting.   Semaglutide, 2 MG/DOSE, 8 MG/3ML SOPN Inject 2 mg as directed once a week.   Semaglutide-Weight Management 2.4 MG/0.75ML SOAJ Inject 2.4 mg into the skin once a week.   venlafaxine XR (EFFEXOR-XR) 75 MG 24 hr capsule Take 1 capsule (75 mg total) by mouth  daily with breakfast.   [DISCONTINUED] chlorpheniramine-HYDROcodone (TUSSIONEX) 10-8 MG/5ML Take 5 mLs by mouth 2 (two) times daily.   [DISCONTINUED] fluconazole (DIFLUCAN) 150 MG tablet Take 1 tab po once may repeat in 3 days if symptoms persist   No facility-administered encounter medications on file as of 12/27/2021.    Surgical History: Past Surgical History:  Procedure Laterality Date   CHOLECYSTECTOMY  09-26-13   COLONOSCOPY WITH PROPOFOL N/A 01/05/2021   Procedure: COLONOSCOPY WITH PROPOFOL;  Surgeon: Jonathon Bellows, MD;  Location[facility] END[facility]COPY;  Service: Gastroenterology;  Laterality: N/A;   CYSTOSCOPY N/A 01/12/2017   Procedure: CYSTOSCOPY;  Surgeon: Malachy Mood, MD;  Location: [facility] ORS;  Service: Gynecology;  Laterality: N/A;   HYSTEROSCOPY WITH D & C  11/24/2016   Procedure: DILATATION AND CURETTAGE /HYSTEROSCOPY;  Surgeon: Malachy Mood, MD;  Location: [facility] ORS;  Service: Gynecology;;   KNEE SURGERY Right    LEEP  2010   [facility]   TONSILLECTOMY  2014   TOTAL LAPAROSCOPIC HYSTERECTOMY WITH SALPINGECTOMY Right 01/12/2017   Procedure: TOTAL LAPAROSCOPIC HYSTERECTOMY WITH RIGHT SALPINGECTOMY;  Surgeon: Malachy Mood, MD;  Location: [facility] ORS;  Service: Gynecology;  Laterality: Right;   TUBAL LIGATION  1997    Medical History: Past Medical History:  Diagnosis Date   Anemia    AS A CHILD   Diabetes mellitus without complication (HCC)    Fibroids    Hemorrhoids    Seasonal allergies    Sleep apnea  NO CPAP    Family History: Family History  Problem Relation Age of Onset   Diabetes Sister    Breast cancer Neg Hx     Social History   Socioeconomic History   Marital status: Married    Spouse name: Not on file   Number of children: Not on file   Years of education: Not on file   Highest education level: Not on file  Occupational History   Not on file  Tobacco Use   Smoking status: Never   Smokeless tobacco: Never  Vaping Use   Vaping Use: Never  used  Substance and Sexual Activity   Alcohol use: No   Drug use: No   Sexual activity: Yes    Birth control/protection: None  Other Topics Concern   Not on file  Social History Narrative   Not on file   Social Determinants of Health   Financial Resource Strain: Not on file  Food Insecurity: Not on file  Transportation Needs: Not on file  Physical Activity: Not on file  Stress: Not on file  Social Connections: Not on file  Intimate Partner Violence: Not on file      Review of Systems  Constitutional:  Positive for appetite change, chills and fatigue. Negative for fever.  HENT:  Positive for congestion, postnasal drip, rhinorrhea, sinus pressure, sinus pain and sneezing. Negative for sore throat.   Respiratory:  Positive for cough, chest tightness, shortness of breath and wheezing.   Cardiovascular: Negative.  Negative for chest pain and palpitations.  Gastrointestinal:  Positive for nausea and vomiting. Negative for abdominal distention, abdominal pain and diarrhea.  Musculoskeletal: Negative.  Negative for myalgias.  Neurological:  Positive for dizziness, light-headedness and headaches.    Vital Signs: Resp 16   Ht '5\' 4"'$  (1.626 m)   LMP 12/28/2016   BMI 27.70 kg/m    Observation/Objective: She is alert and oriented and engages in conversation appropriately. She does not sound as though she is in any acute distress over telephone call today.     Assessment/Plan: 1. Type 2 diabetes mellitus with hyperglycemia, without long-term current use of insulin (HCC) Try larger dose of wegovy and see if in stock at pharmacy  2. Overweight with body mass index (BMI) of 27 to 27.9 in adult Higher dose same med but weight loss version wegovy, see if in stock - Semaglutide-Weight Management 2.4 MG/0.75ML SOAJ; Inject 2.4 mg into the skin once a week.  Dispense: 3 mL; Refill: 2  3. Acute bacterial bronchitis Finish prior antibiotics, see if improving. If not call clinic, will  try something else.    General Counseling: Mandy Shea verbalizes understanding of the findings of today's phone visit and agrees with plan of treatment. I have discussed any further diagnostic evaluation that may be needed or ordered today. We also reviewed her medications today. she has been encouraged to call the office with any questions or concerns that should arise related to todays visit.  Return in about 2 months (around 02/26/2022) for F/U, Recheck A1C, Mandy Shea PCP.   No orders of the defined types were placed in this encounter.   Meds ordered this encounter  Medications   Semaglutide-Weight Management 2.4 MG/0.75ML SOAJ    Sig: Inject 2.4 mg into the skin once a week.    Dispense:  3 mL    Refill:  2    Time spent:10 Minutes Time spent with patient included reviewing progress notes, labs, imaging studies, and discussing plan for follow up.  [place] Controlled Substance Database was reviewed by me for overdose risk score (ORS) if appropriate.  This patient was seen by Jonetta Osgood, FNP-C in collaboration with Dr. Clayborn Bigness as a part of collaborative care agreement.  Katsumi Wisler R. Valetta Fuller, MSN, FNP-C Internal medicine

## 2021-12-28 NOTE — Telephone Encounter (Signed)
Patient was seen. 

## 2021-12-31 ENCOUNTER — Telehealth: Payer: Self-pay

## 2021-12-31 NOTE — Telephone Encounter (Signed)
Sent P.A. for patient's UOHFGB.

## 2022-01-04 ENCOUNTER — Telehealth: Payer: Self-pay

## 2022-01-04 NOTE — Telephone Encounter (Signed)
Patient's P.A. for Mancel Parsons has been approved.

## 2022-01-12 ENCOUNTER — Ambulatory Visit: Payer: BC Managed Care – PPO | Admitting: Nurse Practitioner

## 2022-01-12 ENCOUNTER — Encounter: Payer: Self-pay | Admitting: Nurse Practitioner

## 2022-01-12 VITALS — Resp 16 | Ht 64.0 in

## 2022-01-15 ENCOUNTER — Encounter: Payer: Self-pay | Admitting: Nurse Practitioner

## 2022-01-18 ENCOUNTER — Ambulatory Visit
Admission: RE | Admit: 2022-01-18 | Discharge: 2022-01-18 | Disposition: A | Discharge status: Home / Self Care | Payer: BC Managed Care – PPO | Attending: Nurse Practitioner | Admitting: Nurse Practitioner

## 2022-01-18 ENCOUNTER — Encounter: Payer: Self-pay | Admitting: Nurse Practitioner

## 2022-01-18 ENCOUNTER — Ambulatory Visit (INDEPENDENT_AMBULATORY_CARE_PROVIDER_SITE_OTHER): Payer: BC Managed Care – PPO | Admitting: Nurse Practitioner

## 2022-01-18 ENCOUNTER — Ambulatory Visit
Admission: RE | Admit: 2022-01-18 | Discharge: 2022-01-18 | Disposition: A | Discharge status: Home / Self Care | Payer: BC Managed Care – PPO | Source: Ambulatory Visit | Attending: Nurse Practitioner | Admitting: Nurse Practitioner

## 2022-01-18 VITALS — BP 131/83 | HR 88 | Temp 97.3°F | Resp 16 | Ht 64.0 in | Wt 164.4 lb

## 2022-01-18 DIAGNOSIS — B379 Candidiasis, unspecified: Secondary | ICD-10-CM

## 2022-01-18 DIAGNOSIS — J189 Pneumonia, unspecified organism: Secondary | ICD-10-CM | POA: Insufficient documentation

## 2022-01-18 DIAGNOSIS — E1165 Type 2 diabetes mellitus with hyperglycemia: Secondary | ICD-10-CM | POA: Diagnosis not present

## 2022-01-18 DIAGNOSIS — R051 Acute cough: Secondary | ICD-10-CM | POA: Insufficient documentation

## 2022-01-18 DIAGNOSIS — T3695XA Adverse effect of unspecified systemic antibiotic, initial encounter: Secondary | ICD-10-CM

## 2022-01-18 MED ORDER — LEVOFLOXACIN 750 MG PO TABS: 750.0000 mg | ORAL_TABLET | Freq: Every day | ORAL | 0 refills | Status: AC

## 2022-01-18 MED ORDER — FLUCONAZOLE 150 MG PO TABS: ORAL_TABLET | ORAL | 0 refills | Status: DC

## 2022-01-18 MED ORDER — ONETOUCH DELICA PLUS LANCET30G MISC: 5 refills | Status: DC

## 2022-01-18 MED ORDER — ONETOUCH VERIO VI STRP: ORAL_STRIP | 12 refills | Status: DC

## 2022-01-18 MED ORDER — HYDROCOD POLI-CHLORPHE POLI ER 10-8 MG/5ML PO SUER: 5.0000 mL | Freq: Two times a day (BID) | ORAL | 0 refills | Status: DC

## 2022-01-18 NOTE — Progress Notes (Signed)
[facility] [place], [place] [address]  Internal MEDICINE  Office Visit Note  Patient Name: Mandy Shea  [id number]  [id number]  Date of Service: 01/18/2022  Chief Complaint  Patient presents with   Acute Visit    Possible Bronchitis    URI     HPI Gissele presents for an acute sick visit for a respiratory infection Recently had bronchitis. Onset was 5 days ago Reports hoarseness, SOB, cough, chest tightness, yellow-green sputum production, runny nose, and nasal congestion Describes sx as moderate to severe.   --has new meter, one touch, needs test strips and lancets ordered.  --will be starting 2.4 mg wegovy dose.  Need fluconazole with antibiotic     Current Medication:  Outpatient Encounter Medications as of 01/18/2022  Medication Sig   albuterol (VENTOLIN HFA) 108 (90 Base) MCG/ACT inhaler Inhale 2 puffs into the lungs every 6 (six) hours as needed for wheezing or shortness of breath.   Dupilumab (DUPIXENT [place]) Inject 300 mg into the skin.   glucose blood (ONETOUCH VERIO) test strip Use 1 test strip to check glucose once daily and as needed   Lancets (ONETOUCH DELICA PLUS [id number]) MISC Use 1 lancet to check glucose once daily and as needed   levofloxacin (LEVAQUIN) 750 MG tablet Take 1 tablet (750 mg total) by mouth daily for 7 days.   loratadine (CLARITIN) 10 MG tablet Take 1 tablet (10 mg total) by mouth daily.   metFORMIN (GLUCOPHAGE-XR) 500 MG 24 hr tablet TAKE 1 TABLET BY MOUTH EVERY DAY FOR DIABETES   montelukast (SINGULAIR) 10 MG tablet Take 1 tablet (10 mg total) by mouth daily.   promethazine (PHENERGAN) 25 MG tablet Take 1 tablet (25 mg total) by mouth every 8 (eight) hours as needed for nausea or vomiting.   Semaglutide, 2 MG/DOSE, 8 MG/3ML SOPN Inject 2 mg as directed once a week.   Semaglutide-Weight Management 2.4 MG/0.75ML SOAJ Inject 2.4 mg into the skin once a week.   venlafaxine XR (EFFEXOR-XR) 75 MG 24 hr capsule  Take 1 capsule (75 mg total) by mouth daily with breakfast.   [DISCONTINUED] chlorpheniramine-HYDROcodone (TUSSIONEX) 10-8 MG/5ML Take 5 mLs by mouth 2 (two) times daily.   [DISCONTINUED] fluconazole (DIFLUCAN) 150 MG tablet Take 1 tab po once may repeat in 3 days if symptoms persist   chlorpheniramine-HYDROcodone (TUSSIONEX) 10-8 MG/5ML Take 5 mLs by mouth 2 (two) times daily.   fluconazole (DIFLUCAN) 150 MG tablet Take 1 tab po once may repeat in 3 days if symptoms persist   No facility-administered encounter medications on file as of 01/18/2022.      Medical History: Past Medical History:  Diagnosis Date   Anemia    AS A CHILD   Diabetes mellitus without complication (HCC)    Fibroids    Hemorrhoids    Seasonal allergies    Sleep apnea    NO CPAP     Vital Signs: BP 131/83   Pulse 88   Temp (!) 97.3 F (36.3 C)   Resp 16   Ht '5\' 4"'$  (1.626 m)   Wt 164 lb 6.4 oz (74.6 kg)   LMP 12/28/2016   SpO2 97%   BMI 28.22 kg/m    Review of Systems  Physical Exam Vitals reviewed.  Constitutional:      General: She is not in acute distress.    Appearance: She is obese. She is ill-appearing.  HENT:     Head: Normocephalic and atraumatic.  Nose: Congestion and rhinorrhea present.  Eyes:     Pupils: Pupils are equal, round, and reactive to light.  Cardiovascular:     Rate and Rhythm: Regular rhythm. Tachycardia present.     Heart sounds: Normal heart sounds.  Pulmonary:     Effort: Pulmonary effort is normal. No accessory muscle usage or respiratory distress.     Breath sounds: Examination of the right-middle field reveals decreased breath sounds and rales. Examination of the left-middle field reveals decreased breath sounds and rales. Examination of the right-lower field reveals rales. Examination of the left-lower field reveals rales. Decreased breath sounds and rales present.  Neurological:     Mental Status: She is alert and oriented to person, place, and time.   Psychiatric:        Mood and Affect: Mood normal.        Behavior: Behavior normal.       Assessment/Plan: 1. Community acquired pneumonia of both lower lobes Chest xray ordered and empiric antibiotic treatment prescribed - levofloxacin (LEVAQUIN) 750 MG tablet; Take 1 tablet (750 mg total) by mouth daily for 7 days.  Dispense: 7 tablet; Refill: 0 - chlorpheniramine-HYDROcodone (TUSSIONEX) 10-8 MG/5ML; Take 5 mLs by mouth 2 (two) times daily.  Dispense: 140 mL; Refill: 0 - DG Chest 2 View; Future  2. Acute cough Medication prescribed for symptomatic relief of cough - chlorpheniramine-HYDROcodone (TUSSIONEX) 10-8 MG/5ML; Take 5 mLs by mouth 2 (two) times daily.  Dispense: 140 mL; Refill: 0 - DG Chest 2 View; Future  3. Type 2 diabetes mellitus with hyperglycemia, without long-term current use of insulin (HCC) Lancets and strips ordered for new glucose meter - Lancets (ONETOUCH DELICA PLUS [id number]) MISC; Use 1 lancet to check glucose once daily and as needed  Dispense: 100 each; Refill: 5 - glucose blood (ONETOUCH VERIO) test strip; Use 1 test strip to check glucose once daily and as needed  Dispense: 100 each; Refill: 12  4. Antibiotic-induced yeast infection Prophylactic treatment prescribed. - fluconazole (DIFLUCAN) 150 MG tablet; Take 1 tab po once may repeat in 3 days if symptoms persist  Dispense: 3 tablet; Refill: 0   General Counseling: Kristopher verbalizes understanding of the findings of todays visit and agrees with plan of treatment. I have discussed any further diagnostic evaluation that may be needed or ordered today. We also reviewed her medications today. she has been encouraged to call the office with any questions or concerns that should arise related to todays visit.    Counseling:    Orders Placed This Encounter  Procedures   DG Chest 2 View    Meds ordered this encounter  Medications   levofloxacin (LEVAQUIN) 750 MG tablet    Sig: Take 1 tablet (750 mg  total) by mouth daily for 7 days.    Dispense:  7 tablet    Refill:  0    Fill ASAP thanks   chlorpheniramine-HYDROcodone (TUSSIONEX) 10-8 MG/5ML    Sig: Take 5 mLs by mouth 2 (two) times daily.    Dispense:  140 mL    Refill:  0   fluconazole (DIFLUCAN) 150 MG tablet    Sig: Take 1 tab po once may repeat in 3 days if symptoms persist    Dispense:  3 tablet    Refill:  0   Lancets (ONETOUCH DELICA PLUS [id number]) MISC    Sig: Use 1 lancet to check glucose once daily and as needed    Dispense:  100 each    Refill:  5    E11.65 dx code, fill today   glucose blood (ONETOUCH VERIO) test strip    Sig: Use 1 test strip to check glucose once daily and as needed    Dispense:  100 each    Refill:  12    Return if symptoms worsen or fail to improve.  [place] Controlled Substance Database was reviewed by me for overdose risk score (ORS)  Time spent:30 Minutes Time spent with patient included reviewing progress notes, labs, imaging studies, and discussing plan for follow up.   This patient was seen by Jonetta Osgood, FNP-C in collaboration with Dr. Clayborn Bigness as a part of collaborative care agreement.  Adasha Boehme R. Valetta Fuller, MSN, FNP-C Internal Medicine

## 2022-01-21 NOTE — Progress Notes (Signed)
Chest xray is normal, probably just a bad case of bronchitis but no pneumonia. Make sure to finish antibiotic

## 2022-01-24 ENCOUNTER — Telehealth: Payer: Self-pay

## 2022-01-24 ENCOUNTER — Ambulatory Visit: Payer: BC Managed Care – PPO | Admitting: Nurse Practitioner

## 2022-01-24 NOTE — Telephone Encounter (Signed)
Patient understood.

## 2022-01-24 NOTE — Telephone Encounter (Signed)
-----   Message from Jonetta Osgood, NP sent at 01/21/2022  7:46 AM EST ----- Chest xray is normal, probably just a bad case of bronchitis but no pneumonia. Make sure to finish antibiotic

## 2022-02-05 NOTE — Progress Notes (Signed)
Appt cancelled

## 2022-02-06 ENCOUNTER — Encounter: Payer: Self-pay | Admitting: Nurse Practitioner

## 2022-02-24 ENCOUNTER — Telehealth (INDEPENDENT_AMBULATORY_CARE_PROVIDER_SITE_OTHER): Payer: BC Managed Care – PPO | Admitting: Internal Medicine

## 2022-02-24 ENCOUNTER — Encounter: Payer: Self-pay | Admitting: Internal Medicine

## 2022-02-24 DIAGNOSIS — J45909 Unspecified asthma, uncomplicated: Secondary | ICD-10-CM | POA: Diagnosis not present

## 2022-02-24 DIAGNOSIS — T3695XA Adverse effect of unspecified systemic antibiotic, initial encounter: Secondary | ICD-10-CM | POA: Diagnosis not present

## 2022-02-24 DIAGNOSIS — B379 Candidiasis, unspecified: Secondary | ICD-10-CM

## 2022-02-24 MED ORDER — FLUCONAZOLE 150 MG PO TABS: ORAL_TABLET | ORAL | 0 refills | Status: DC

## 2022-02-24 MED ORDER — BUDESONIDE-FORMOTEROL FUMARATE 160-4.5 MCG/ACT IN AERO: 2.0000 | INHALATION_SPRAY | Freq: Two times a day (BID) | RESPIRATORY_TRACT | 3 refills | Status: DC

## 2022-02-24 MED ORDER — PREDNISONE 10 MG PO TABS: ORAL_TABLET | ORAL | 0 refills | Status: DC

## 2022-02-24 MED ORDER — AZITHROMYCIN 250 MG PO TABS: ORAL_TABLET | ORAL | 0 refills | Status: DC

## 2022-02-24 NOTE — Progress Notes (Signed)
[facility] [place], [place] [address]  Internal MEDICINE  Telephone Visit  Patient Name: Mandy Shea  [id number]  [id number]  Date of Service: 03/08/2022  I connected with the patient at  by telephone and verified the patients identity using two identifiers.   I discussed the limitations, risks, security and privacy concerns of performing an evaluation and management service by telephone and the availability of in person appointments. I also discussed with the patient that there may be a patient responsible charge related to the service.  The patient expressed understanding and agrees to proceed.    Chief Complaint  Patient presents with   Telephone Assessment    Prefers video: [phone]   Telephone Screen   Cough    Harsh cough with some phlegm    HPI Pt is connected via video visit Cough and congestion for 2 months off and on, has been on abx as well  Productive sputum, now wheezing     Current Medication: Outpatient Encounter Medications as of 02/24/2022  Medication Sig   albuterol (VENTOLIN HFA) 108 (90 Base) MCG/ACT inhaler Inhale 2 puffs into the lungs every 6 (six) hours as needed for wheezing or shortness of breath.   budesonide-formoterol (SYMBICORT) 160-4.5 MCG/ACT inhaler Inhale 2 puffs into the lungs 2 (two) times daily.   Dupilumab (DUPIXENT [place]) Inject 300 mg into the skin.   glucose blood (ONETOUCH VERIO) test strip Use 1 test strip to check glucose once daily and as needed   Lancets (ONETOUCH DELICA PLUS [id number]) MISC Use 1 lancet to check glucose once daily and as needed   loratadine (CLARITIN) 10 MG tablet Take 1 tablet (10 mg total) by mouth daily.   metFORMIN (GLUCOPHAGE-XR) 500 MG 24 hr tablet TAKE 1 TABLET BY MOUTH EVERY DAY FOR DIABETES   montelukast (SINGULAIR) 10 MG tablet Take 1 tablet (10 mg total) by mouth daily.   promethazine (PHENERGAN) 25 MG tablet Take 1 tablet (25 mg total) by mouth every 8 (eight) hours as  needed for nausea or vomiting.   Semaglutide, 2 MG/DOSE, 8 MG/3ML SOPN Inject 2 mg as directed once a week.   Semaglutide-Weight Management 2.4 MG/0.75ML SOAJ Inject 2.4 mg into the skin once a week.   venlafaxine XR (EFFEXOR-XR) 75 MG 24 hr capsule Take 1 capsule (75 mg total) by mouth daily with breakfast.   [DISCONTINUED] azithromycin (ZITHROMAX) 250 MG tablet Take one tab a day for 10 days for uri   [DISCONTINUED] chlorpheniramine-HYDROcodone (TUSSIONEX) 10-8 MG/5ML Take 5 mLs by mouth 2 (two) times daily.   [DISCONTINUED] fluconazole (DIFLUCAN) 150 MG tablet Take 1 tab po once may repeat in 3 days if symptoms persist   [DISCONTINUED] fluconazole (DIFLUCAN) 150 MG tablet Take one tab po q week for fungal infection   [DISCONTINUED] predniSONE (DELTASONE) 10 MG tablet Take one tab 3 x day for 3 days, then take one tab 2 x a day for 3 days and then take one tab a day for 3 days for copd   fluconazole (DIFLUCAN) 150 MG tablet Take 1 tab po once may repeat in 3 days if symptoms persist   No facility-administered encounter medications on file as of 02/24/2022.    Surgical History: Past Surgical History:  Procedure Laterality Date   CHOLECYSTECTOMY  09-26-13   COLONOSCOPY WITH PROPOFOL N/A 01/05/2021   Procedure: COLONOSCOPY WITH PROPOFOL;  Surgeon: Jonathon Bellows, MD;  Location[facility] ENDOSCOPY;  Service: Gastroenterology;  Laterality: N/A;   CYSTOSCOPY N/A 01/12/2017  Procedure: CYSTOSCOPY;  Surgeon: Malachy Mood, MD;  Location: [facility] ORS;  Service: Gynecology;  Laterality: N/A;   HYSTEROSCOPY WITH D & C  11/24/2016   Procedure: DILATATION AND CURETTAGE /HYSTEROSCOPY;  Surgeon: Malachy Mood, MD;  Location: [facility] ORS;  Service: Gynecology;;   KNEE SURGERY Right    LEEP  2010   [facility]   TONSILLECTOMY  2014   TOTAL LAPAROSCOPIC HYSTERECTOMY WITH SALPINGECTOMY Right 01/12/2017   Procedure: TOTAL LAPAROSCOPIC HYSTERECTOMY WITH RIGHT SALPINGECTOMY;  Surgeon: Malachy Mood, MD;   Location: [facility] ORS;  Service: Gynecology;  Laterality: Right;   TUBAL LIGATION  1997    Medical History: Past Medical History:  Diagnosis Date   Anemia    AS A CHILD   Diabetes mellitus without complication (HCC)    Fibroids    Hemorrhoids    Seasonal allergies    Sleep apnea    NO CPAP    Family History: Family History  Problem Relation Age of Onset   Diabetes Sister    Breast cancer Neg Hx     Social History   Socioeconomic History   Marital status: Married    Spouse name: Not on file   Number of children: Not on file   Years of education: Not on file   Highest education level: Not on file  Occupational History   Not on file  Tobacco Use   Smoking status: Never   Smokeless tobacco: Never  Vaping Use   Vaping Use: Never used  Substance and Sexual Activity   Alcohol use: No   Drug use: No   Sexual activity: Yes    Birth control/protection: None  Other Topics Concern   Not on file  Social History Narrative   Not on file   Social Determinants of Health   Financial Resource Strain: Not on file  Food Insecurity: Not on file  Transportation Needs: Not on file  Physical Activity: Not on file  Stress: Not on file  Social Connections: Not on file  Intimate Partner Violence: Not on file      Review of Systems  Constitutional:  Negative for chills, fatigue and unexpected weight change.  HENT:  Negative for congestion, postnasal drip, rhinorrhea, sneezing and sore throat.   Eyes:  Negative for redness.  Respiratory:  Positive for cough and shortness of breath. Negative for chest tightness.   Cardiovascular:  Negative for chest pain and palpitations.  Gastrointestinal:  Negative for abdominal pain, constipation, diarrhea, nausea and vomiting.  Genitourinary:  Negative for dysuria and frequency.  Musculoskeletal:  Negative for arthralgias, back pain, joint swelling and neck pain.  Skin:  Negative for rash.  Neurological: Negative.  Negative for tremors and  numbness.  Hematological:  Negative for adenopathy. Does not bruise/bleed easily.  Psychiatric/Behavioral:  Negative for behavioral problems (Depression), sleep disturbance and suicidal ideas. The patient is not nervous/anxious.     Vital Signs: LMP 12/28/2016    Observation/Objective: She is wheezing and congested     Assessment/Plan: 1. Acute asthmatic bronchitis Take medications as prescribed, might need PFT's if continues to have symptoms  - budesonide-formoterol (SYMBICORT) 160-4.5 MCG/ACT inhaler; Inhale 2 puffs into the lungs 2 (two) times daily.  Dispense: 1 each; Refill: 3  2. Antibiotic-induced yeast infection - fluconazole (DIFLUCAN) 150 MG tablet; Take 1 tab po once may repeat in 3 days if symptoms persist  Dispense: 3 tablet; Refill: 0   General Counseling: Alaa verbalizes understanding of the findings of today's phone visit and agrees with plan of treatment.  I have discussed any further diagnostic evaluation that may be needed or ordered today. We also reviewed her medications today. she has been encouraged to call the office with any questions or concerns that should arise related to todays visit.    No orders of the defined types were placed in this encounter.   Meds ordered this encounter  Medications   DISCONTD: azithromycin (ZITHROMAX) 250 MG tablet    Sig: Take one tab a day for 10 days for uri    Dispense:  10 tablet    Refill:  0   DISCONTD: predniSONE (DELTASONE) 10 MG tablet    Sig: Take one tab 3 x day for 3 days, then take one tab 2 x a day for 3 days and then take one tab a day for 3 days for copd    Dispense:  18 tablet    Refill:  0   budesonide-formoterol (SYMBICORT) 160-4.5 MCG/ACT inhaler    Sig: Inhale 2 puffs into the lungs 2 (two) times daily.    Dispense:  1 each    Refill:  3   DISCONTD: fluconazole (DIFLUCAN) 150 MG tablet    Sig: Take one tab po q week for fungal infection    Dispense:  4 tablet    Refill:  0   fluconazole  (DIFLUCAN) 150 MG tablet    Sig: Take 1 tab po once may repeat in 3 days if symptoms persist    Dispense:  3 tablet    Refill:  0    Time spent:15 Minutes    Dr Lavera Guise Internal medicine

## 2022-03-07 ENCOUNTER — Ambulatory Visit (INDEPENDENT_AMBULATORY_CARE_PROVIDER_SITE_OTHER): Payer: BC Managed Care – PPO | Admitting: Internal Medicine

## 2022-03-07 ENCOUNTER — Encounter: Payer: Self-pay | Admitting: Internal Medicine

## 2022-03-07 VITALS — BP 119/81 | HR 88 | Temp 98.5°F | Resp 16 | Ht 64.0 in | Wt 161.6 lb

## 2022-03-07 DIAGNOSIS — B379 Candidiasis, unspecified: Secondary | ICD-10-CM | POA: Diagnosis not present

## 2022-03-07 DIAGNOSIS — J45909 Unspecified asthma, uncomplicated: Secondary | ICD-10-CM

## 2022-03-07 DIAGNOSIS — E1165 Type 2 diabetes mellitus with hyperglycemia: Secondary | ICD-10-CM

## 2022-03-07 LAB — POCT GLYCOSYLATED HEMOGLOBIN (HGB A1C): Hemoglobin A1C: 7.4 % — AB (ref 4.0–5.6)

## 2022-03-07 NOTE — Progress Notes (Signed)
[facility] [place], [place] [address]  Internal MEDICINE  Office Visit Note  Patient Name: Mandy Shea  [id number]  [id number]  Date of Service: 03/16/2022  Chief Complaint  Patient presents with   Follow-up   Diabetes   Asthma   Quality Metric Gaps    TDAP and Shingles Vaccines    HPI Pt is seen today for routine follow up She has been not feeling well for last 3 month with acute bronchitis, cough and wheezing. She has taken multiple abx and steroids in the past  She was given Symbicort on previous visit, feels 100% better, no prior h/o asthma  She is a diabetic     Current Medication: Outpatient Encounter Medications as of 03/07/2022  Medication Sig   albuterol (VENTOLIN HFA) 108 (90 Base) MCG/ACT inhaler Inhale 2 puffs into the lungs every 6 (six) hours as needed for wheezing or shortness of breath.   budesonide-formoterol (SYMBICORT) 160-4.5 MCG/ACT inhaler Inhale 2 puffs into the lungs 2 (two) times daily.   Dupilumab (DUPIXENT [place]) Inject 300 mg into the skin.   fluconazole (DIFLUCAN) 150 MG tablet Take 1 tab po once may repeat in 3 days if symptoms persist   glucose blood (ONETOUCH VERIO) test strip Use 1 test strip to check glucose once daily and as needed   Lancets (ONETOUCH DELICA PLUS [id number]) MISC Use 1 lancet to check glucose once daily and as needed   loratadine (CLARITIN) 10 MG tablet Take 1 tablet (10 mg total) by mouth daily.   metFORMIN (GLUCOPHAGE-XR) 500 MG 24 hr tablet TAKE 1 TABLET BY MOUTH EVERY DAY FOR DIABETES   montelukast (SINGULAIR) 10 MG tablet Take 1 tablet (10 mg total) by mouth daily.   promethazine (PHENERGAN) 25 MG tablet Take 1 tablet (25 mg total) by mouth every 8 (eight) hours as needed for nausea or vomiting.   Semaglutide, 2 MG/DOSE, 8 MG/3ML SOPN Inject 2 mg as directed once a week.   Semaglutide-Weight Management 2.4 MG/0.75ML SOAJ Inject 2.4 mg into the skin once a week.   venlafaxine XR (EFFEXOR-XR) 75  MG 24 hr capsule Take 1 capsule (75 mg total) by mouth daily with breakfast.   [DISCONTINUED] azithromycin (ZITHROMAX) 250 MG tablet Take one tab a day for 10 days for uri   [DISCONTINUED] chlorpheniramine-HYDROcodone (TUSSIONEX) 10-8 MG/5ML Take 5 mLs by mouth 2 (two) times daily.   [DISCONTINUED] fluconazole (DIFLUCAN) 150 MG tablet Take one tab po q week for fungal infection   [DISCONTINUED] predniSONE (DELTASONE) 10 MG tablet Take one tab 3 x day for 3 days, then take one tab 2 x a day for 3 days and then take one tab a day for 3 days for copd   No facility-administered encounter medications on file as of 03/07/2022.    Surgical History: Past Surgical History:  Procedure Laterality Date   CHOLECYSTECTOMY  09-26-13   COLONOSCOPY WITH PROPOFOL N/A 01/05/2021   Procedure: COLONOSCOPY WITH PROPOFOL;  Surgeon: Jonathon Bellows, MD;  Location: [facility] ENDOSCOPY;  Service: Gastroenterology;  Laterality: N/A;   CYSTOSCOPY N/A 01/12/2017   Procedure: CYSTOSCOPY;  Surgeon: Malachy Mood, MD;  Location: [facility] ORS;  Service: Gynecology;  Laterality: N/A;   HYSTEROSCOPY WITH D & C  11/24/2016   Procedure: DILATATION AND CURETTAGE /HYSTEROSCOPY;  Surgeon: Malachy Mood, MD;  Location: [facility] ORS;  Service: Gynecology;;   KNEE SURGERY Right    LEEP  2010   [facility]   TONSILLECTOMY  2014   TOTAL LAPAROSCOPIC HYSTERECTOMY  WITH SALPINGECTOMY Right 01/12/2017   Procedure: TOTAL LAPAROSCOPIC HYSTERECTOMY WITH RIGHT SALPINGECTOMY;  Surgeon: Malachy Mood, MD;  Location: [facility] ORS;  Service: Gynecology;  Laterality: Right;   TUBAL LIGATION  1997    Medical History: Past Medical History:  Diagnosis Date   Anemia    AS A CHILD   Diabetes mellitus without complication (HCC)    Fibroids    Hemorrhoids    Seasonal allergies    Sleep apnea    NO CPAP    Family History: Family History  Problem Relation Age of Onset   Diabetes Sister    Breast cancer Neg Hx     Social History   Socioeconomic  History   Marital status: Married    Spouse name: Not on file   Number of children: Not on file   Years of education: Not on file   Highest education level: Not on file  Occupational History   Not on file  Tobacco Use   Smoking status: Never   Smokeless tobacco: Never  Vaping Use   Vaping Use: Never used  Substance and Sexual Activity   Alcohol use: No   Drug use: No   Sexual activity: Yes    Birth control/protection: None  Other Topics Concern   Not on file  Social History Narrative   Not on file   Social Determinants of Health   Financial Resource Strain: Not on file  Food Insecurity: Not on file  Transportation Needs: Not on file  Physical Activity: Not on file  Stress: Not on file  Social Connections: Not on file  Intimate Partner Violence: Not on file      Review of Systems  Constitutional:  Negative for fatigue and fever.  HENT:  Negative for congestion, mouth sores and postnasal drip.   Respiratory:  Positive for cough.   Cardiovascular:  Negative for chest pain.  Genitourinary:  Negative for flank pain.  Psychiatric/Behavioral: Negative.      Vital Signs: BP 119/81   Pulse 88   Temp 98.5 F (36.9 C)   Resp 16   Ht '5\' 4"'$  (1.626 m)   Wt 161 lb 9.6 oz (73.3 kg)   LMP 12/28/2016   SpO2 99%   BMI 27.74 kg/m    Physical Exam Constitutional:      Appearance: Normal appearance.  HENT:     Head: Normocephalic and atraumatic.     Nose: Nose normal.     Mouth/Throat:     Mouth: Mucous membranes are moist.     Pharynx: No posterior oropharyngeal erythema.  Eyes:     Extraocular Movements: Extraocular movements intact.     Pupils: Pupils are equal, round, and reactive to light.  Cardiovascular:     Pulses: Normal pulses.     Heart sounds: Normal heart sounds.  Pulmonary:     Effort: Pulmonary effort is normal.     Breath sounds: Normal breath sounds.  Neurological:     General: No focal deficit present.     Mental Status: She is alert.   Psychiatric:        Mood and Affect: Mood normal.        Behavior: Behavior normal.        Assessment/Plan: 1. Type 2 diabetes mellitus with hyperglycemia, without long-term current use of insulin ([place]) Will continue Ozempic as before  - POCT HgB A1C - Urine Microalbumin w/creat. ratio  2. Acute asthmatic bronchitis Resolved, will continue on Symbicort   3. Antibiotic-induced yeast infection  Will continue on Diflucan prn   General Counseling: Cambre verbalizes understanding of the findings of todays visit and agrees with plan of treatment. I have discussed any further diagnostic evaluation that may be needed or ordered today. We also reviewed her medications today. she has been encouraged to call the office with any questions or concerns that should arise related to todays visit.    Orders Placed This Encounter  Procedures   Urine Microalbumin w/creat. ratio   POCT HgB A1C    No orders of the defined types were placed in this encounter.   Total time spent:35 Minutes Time spent includes review of chart, medications, test results, and follow up plan with the patient.   [place] Controlled Substance Database was reviewed by me.   Dr Lavera Guise Internal medicine

## 2022-03-08 LAB — MICROALBUMIN / CREATININE URINE RATIO
Creatinine, Urine: 160.4 mg/dL
Microalb/Creat Ratio: 7 mg/g creat (ref 0–29)
Microalbumin, Urine: 12 ug/mL

## 2022-03-25 ENCOUNTER — Encounter: Payer: Self-pay | Admitting: Internal Medicine

## 2022-03-25 NOTE — Telephone Encounter (Signed)
Spoke with pt as per alyssa advised her to add Mucinex and see how she is feeling and if she is not feeling call us back Monday

## 2022-03-30 ENCOUNTER — Other Ambulatory Visit: Payer: Self-pay | Admitting: Internal Medicine

## 2022-03-30 DIAGNOSIS — B379 Candidiasis, unspecified: Secondary | ICD-10-CM

## 2022-03-30 MED ORDER — FLUCONAZOLE 150 MG PO TABS: ORAL_TABLET | ORAL | 0 refills | Status: DC

## 2022-04-05 ENCOUNTER — Ambulatory Visit: Payer: BC Managed Care – PPO | Admitting: Nurse Practitioner

## 2022-04-22 ENCOUNTER — Other Ambulatory Visit: Payer: Self-pay

## 2022-04-22 DIAGNOSIS — Z6827 Body mass index (BMI) 27.0-27.9, adult: Secondary | ICD-10-CM

## 2022-04-22 MED ORDER — SEMAGLUTIDE-WEIGHT MANAGEMENT 2.4 MG/0.75ML ~~LOC~~ SOAJ: 2.4000 mg | SUBCUTANEOUS | 2 refills | Status: DC

## 2022-05-30 ENCOUNTER — Telehealth: Payer: Self-pay

## 2022-05-30 NOTE — Telephone Encounter (Signed)
Patient called back to see if you sent anything yet. Told her your busy this morning seeing patient and she said ok never mind she is going out of town to not worry about the medicine.

## 2022-06-16 ENCOUNTER — Telehealth: Payer: Self-pay

## 2022-06-16 NOTE — Telephone Encounter (Signed)
Completed P.A. for patient's ZOXWRU.

## 2022-06-18 ENCOUNTER — Telehealth: Payer: Self-pay | Admitting: Nurse Practitioner

## 2022-06-21 ENCOUNTER — Other Ambulatory Visit: Payer: Self-pay | Admitting: Nurse Practitioner

## 2022-06-21 MED ORDER — TIRZEPATIDE 5 MG/0.5ML ~~LOC~~ SOAJ: 5.0000 mg | SUBCUTANEOUS | 0 refills | Status: DC

## 2022-06-21 NOTE — Telephone Encounter (Signed)
Error

## 2022-07-07 ENCOUNTER — Encounter: Payer: Self-pay | Admitting: Nurse Practitioner

## 2022-07-07 ENCOUNTER — Ambulatory Visit (INDEPENDENT_AMBULATORY_CARE_PROVIDER_SITE_OTHER): Payer: BC Managed Care – PPO | Admitting: Nurse Practitioner

## 2022-07-07 VITALS — BP 130/86 | HR 88 | Temp 97.5°F | Resp 16 | Ht 64.0 in | Wt 164.6 lb

## 2022-07-07 DIAGNOSIS — Z6828 Body mass index (BMI) 28.0-28.9, adult: Secondary | ICD-10-CM

## 2022-07-07 DIAGNOSIS — E663 Overweight: Secondary | ICD-10-CM | POA: Diagnosis not present

## 2022-07-07 DIAGNOSIS — E1165 Type 2 diabetes mellitus with hyperglycemia: Secondary | ICD-10-CM

## 2022-07-07 DIAGNOSIS — Z760 Encounter for issue of repeat prescription: Secondary | ICD-10-CM

## 2022-07-07 DIAGNOSIS — J45909 Unspecified asthma, uncomplicated: Secondary | ICD-10-CM

## 2022-07-07 DIAGNOSIS — J4489 Other specified chronic obstructive pulmonary disease: Secondary | ICD-10-CM

## 2022-07-07 LAB — POCT GLYCOSYLATED HEMOGLOBIN (HGB A1C): Hemoglobin A1C: 6.5 % — AB (ref 4.0–5.6)

## 2022-07-07 MED ORDER — SEMAGLUTIDE (2 MG/DOSE) 8 MG/3ML ~~LOC~~ SOPN
2.0000 mg | PEN_INJECTOR | SUBCUTANEOUS | 1 refills | Status: DC
Start: 2022-07-07 — End: 2022-11-22

## 2022-07-07 MED ORDER — BUDESONIDE-FORMOTEROL FUMARATE 160-4.5 MCG/ACT IN AERO
2.0000 | INHALATION_SPRAY | Freq: Two times a day (BID) | RESPIRATORY_TRACT | 3 refills | Status: AC
Start: 2022-07-07 — End: ?

## 2022-07-07 NOTE — Progress Notes (Signed)
[facility] [address] [place], [place] [address]  Internal MEDICINE  Office Visit Note  Patient Name: Mandy Shea  [id number]  [id number]  Date of Service: 07/07/2022  Chief Complaint  Patient presents with   Diabetes   Follow-up    HPI Mandy Shea presents for a follow-up visit for diabetes and weight loss.  Diabetes -- A1c is 6.5 today which is significantly improved from 7.4 in January. She has been on mounjaro but wants to switch back to ozempic for better weight loss and the cardioprotective aspect.  Weight loss -- has gained 3 lbs since January. Reports that she lost more weight when she was on ozempic compared to mounjaro.  Having symptoms of bronchitis today -- reports cough off and on, slight SOB, nasal congestion.   Current Medication: Outpatient Encounter Medications as of 07/07/2022  Medication Sig   albuterol (VENTOLIN HFA) 108 (90 Base) MCG/ACT inhaler Inhale 2 puffs into the lungs every 6 (six) hours as needed for wheezing or shortness of breath.   Dupilumab (DUPIXENT [place]) Inject 300 mg into the skin.   fluconazole (DIFLUCAN) 150 MG tablet Take 1 tab po once may repeat in 3 days if symptoms persist   glucose blood (ONETOUCH VERIO) test strip Use 1 test strip to check glucose once daily and as needed   Lancets (ONETOUCH DELICA PLUS [id number]) MISC Use 1 lancet to check glucose once daily and as needed   loratadine (CLARITIN) 10 MG tablet Take 1 tablet (10 mg total) by mouth daily.   metFORMIN (GLUCOPHAGE-XR) 500 MG 24 hr tablet TAKE 1 TABLET BY MOUTH EVERY DAY FOR DIABETES   montelukast (SINGULAIR) 10 MG tablet Take 1 tablet (10 mg total) by mouth daily.   venlafaxine XR (EFFEXOR-XR) 75 MG 24 hr capsule Take 1 capsule (75 mg total) by mouth daily with breakfast.   [DISCONTINUED] budesonide-formoterol (SYMBICORT) 160-4.5 MCG/ACT inhaler Inhale 2 puffs into the lungs 2 (two) times daily.   [DISCONTINUED] Semaglutide-Weight Management 2.4 MG/0.75ML SOAJ  Inject 2.4 mg into the skin once a week.   [DISCONTINUED] tirzepatide [facility]) 5 MG/0.5ML Pen Inject 5 mg into the skin once a week.   budesonide-formoterol (SYMBICORT) 160-4.5 MCG/ACT inhaler Inhale 2 puffs into the lungs 2 (two) times daily.   Semaglutide, 2 MG/DOSE, 8 MG/3ML SOPN Inject 2 mg as directed once a week.   [DISCONTINUED] promethazine (PHENERGAN) 25 MG tablet Take 1 tablet (25 mg total) by mouth every 8 (eight) hours as needed for nausea or vomiting.   [DISCONTINUED] Semaglutide, 2 MG/DOSE, 8 MG/3ML SOPN Inject 2 mg as directed once a week. (Patient not taking: Reported on 04/22/2022)   No facility-administered encounter medications on file as of 07/07/2022.    Surgical History: Past Surgical History:  Procedure Laterality Date   CHOLECYSTECTOMY  09-26-13   COLONOSCOPY WITH PROPOFOL N/A 01/05/2021   Procedure: COLONOSCOPY WITH PROPOFOL;  Surgeon: Wyline Mood, MD;  Location: [facility] ENDOSCOPY;  Service: Gastroenterology;  Laterality: N/A;   CYSTOSCOPY N/A 01/12/2017   Procedure: CYSTOSCOPY;  Surgeon: Vena Austria, MD;  Location: [facility] ORS;  Service: Gynecology;  Laterality: N/A;   HYSTEROSCOPY WITH D & C  11/24/2016   Procedure: DILATATION AND CURETTAGE /HYSTEROSCOPY;  Surgeon: Vena Austria, MD;  Location: [facility] ORS;  Service: Gynecology;;   KNEE SURGERY Right    LEEP  2010   [facility]   TONSILLECTOMY  2014   TOTAL LAPAROSCOPIC HYSTERECTOMY WITH SALPINGECTOMY Right 01/12/2017   Procedure: TOTAL LAPAROSCOPIC HYSTERECTOMY WITH RIGHT SALPINGECTOMY;  Surgeon: Bonney Aid,  Carmel Sacramento, MD;  Location: [facility] ORS;  Service: Gynecology;  Laterality: Right;   TUBAL LIGATION  1997    Medical History: Past Medical History:  Diagnosis Date   Anemia    AS A CHILD   Diabetes mellitus without complication (HCC)    Fibroids    Hemorrhoids    Seasonal allergies    Sleep apnea    NO CPAP    Family History: Family History  Problem Relation Age of Onset   Diabetes Sister    Breast  cancer Neg Hx     Social History   Socioeconomic History   Marital status: Married    Spouse name: Not on file   Number of children: Not on file   Years of education: Not on file   Highest education level: Not on file  Occupational History   Not on file  Tobacco Use   Smoking status: Never   Smokeless tobacco: Never  Vaping Use   Vaping Use: Never used  Substance and Sexual Activity   Alcohol use: No   Drug use: No   Sexual activity: Yes    Birth control/protection: None  Other Topics Concern   Not on file  Social History Narrative   Not on file   Social Determinants of Health   Financial Resource Strain: Not on file  Food Insecurity: Not on file  Transportation Needs: Not on file  Physical Activity: Not on file  Stress: Not on file  Social Connections: Not on file  Intimate Partner Violence: Not on file      Review of Systems  Constitutional:  Negative for chills, fatigue and fever.  HENT:  Positive for congestion and postnasal drip. Negative for ear pain, sinus pressure, sore throat and trouble swallowing.   Respiratory:  Positive for cough and shortness of breath. Negative for chest tightness and wheezing.   Cardiovascular: Negative.  Negative for chest pain and palpitations.  Gastrointestinal: Negative.  Negative for abdominal pain, diarrhea, nausea and vomiting.  Neurological:  Negative for headaches.  Psychiatric/Behavioral:  Negative for self-injury and suicidal ideas.     Vital Signs: BP 130/86   Pulse 88   Temp (!) 97.5 F (36.4 C)   Resp 16   Ht 5\' 4"  (1.626 m)   Wt 164 lb 9.6 oz (74.7 kg)   LMP 12/28/2016   SpO2 99%   BMI 28.25 kg/m    Physical Exam Vitals reviewed.  Constitutional:      General: She is not in acute distress.    Appearance: Normal appearance. She is not ill-appearing.  HENT:     Head: Normocephalic and atraumatic.     Nose: Congestion and rhinorrhea present.  Eyes:     Pupils: Pupils are equal, round, and reactive  to light.  Cardiovascular:     Rate and Rhythm: Normal rate and regular rhythm.     Heart sounds: Normal heart sounds. No murmur heard. Pulmonary:     Effort: Pulmonary effort is normal. No accessory muscle usage or respiratory distress.  Neurological:     Mental Status: She is alert and oriented to person, place, and time.  Psychiatric:        Mood and Affect: Mood normal.        Behavior: Behavior normal.        Assessment/Plan: 1. Type 2 diabetes mellitus with hyperglycemia, without long-term current use of insulin (HCC) Restart ozempic. Repeat A1c in 4 month. Discontinue mounjaro.  - POCT glycosylated hemoglobin (Hb A1C) - Semaglutide,  2 MG/DOSE, 8 MG/3ML SOPN; Inject 2 mg as directed once a week.  Dispense: 9 mL; Refill: 1  2. Asthmatic bronchitis , chronic Continue symbicort as prescribed.  - budesonide-formoterol (SYMBICORT) 160-4.5 MCG/ACT inhaler; Inhale 2 puffs into the lungs 2 (two) times daily.  Dispense: 1 each; Refill: 3  3. Overweight with body mass index (BMI) of 28 to 28.9 in adult Restart ozempic as prescribed. Discontinue mounjaro.  - Semaglutide, 2 MG/DOSE, 8 MG/3ML SOPN; Inject 2 mg as directed once a week.  Dispense: 9 mL; Refill: 1   General Counseling: Angelique verbalizes understanding of the findings of todays visit and agrees with plan of treatment. I have discussed any further diagnostic evaluation that may be needed or ordered today. We also reviewed her medications today. she has been encouraged to call the office with any questions or concerns that should arise related to todays visit.    Orders Placed This Encounter  Procedures   POCT glycosylated hemoglobin (Hb A1C)    Meds ordered this encounter  Medications   Semaglutide, 2 MG/DOSE, 8 MG/3ML SOPN    Sig: Inject 2 mg as directed once a week.    Dispense:  9 mL    Refill:  1    Discontinue mounjaro, fill new script for ozempic.   budesonide-formoterol (SYMBICORT) 160-4.5 MCG/ACT inhaler     Sig: Inhale 2 puffs into the lungs 2 (two) times daily.    Dispense:  1 each    Refill:  3    Return for previously scheduled, CPE, Sreenidhi Ganson PCP in august .   Total time spent:30 Minutes Time spent includes review of chart, medications, test results, and follow up plan with the patient.   [place] Controlled Substance Database was reviewed by me.  This patient was seen by Sallyanne Kuster, FNP-C in collaboration with Dr. Beverely Risen as a part of collaborative care agreement.   Belal Scallon R. Tedd Sias, MSN, FNP-C Internal medicine

## 2022-07-22 ENCOUNTER — Encounter: Payer: Self-pay | Admitting: Nurse Practitioner

## 2022-07-25 ENCOUNTER — Encounter: Payer: Self-pay | Admitting: Nurse Practitioner

## 2022-08-03 ENCOUNTER — Telehealth: Payer: Self-pay

## 2022-08-03 NOTE — Telephone Encounter (Signed)
Faxed letter to Georjean Mode with [employer]. My claim #[id number]. Fax # [phone]. And faxed to pt

## 2022-08-03 NOTE — Telephone Encounter (Signed)
Lmom that please call us back letter is ready she like [place] to fax

## 2022-10-03 ENCOUNTER — Other Ambulatory Visit: Payer: Self-pay | Admitting: Internal Medicine

## 2022-10-03 DIAGNOSIS — Z1231 Encounter for screening mammogram for malignant neoplasm of breast: Secondary | ICD-10-CM

## 2022-10-21 ENCOUNTER — Encounter: Payer: Self-pay | Admitting: Nurse Practitioner

## 2022-10-21 ENCOUNTER — Ambulatory Visit (INDEPENDENT_AMBULATORY_CARE_PROVIDER_SITE_OTHER): Payer: BC Managed Care – PPO | Admitting: Nurse Practitioner

## 2022-10-21 VITALS — BP 124/96 | HR 69 | Temp 98.1°F | Resp 16 | Ht 64.0 in | Wt 155.8 lb

## 2022-10-21 DIAGNOSIS — E782 Mixed hyperlipidemia: Secondary | ICD-10-CM

## 2022-10-21 DIAGNOSIS — I10 Essential (primary) hypertension: Secondary | ICD-10-CM

## 2022-10-21 DIAGNOSIS — E538 Deficiency of other specified B group vitamins: Secondary | ICD-10-CM

## 2022-10-21 DIAGNOSIS — E1165 Type 2 diabetes mellitus with hyperglycemia: Secondary | ICD-10-CM | POA: Diagnosis not present

## 2022-10-21 DIAGNOSIS — R300 Dysuria: Secondary | ICD-10-CM

## 2022-10-21 DIAGNOSIS — Z0001 Encounter for general adult medical examination with abnormal findings: Secondary | ICD-10-CM

## 2022-10-21 DIAGNOSIS — Z0000 Encounter for general adult medical examination without abnormal findings: Secondary | ICD-10-CM | POA: Diagnosis not present

## 2022-10-21 DIAGNOSIS — Z760 Encounter for issue of repeat prescription: Secondary | ICD-10-CM

## 2022-10-21 DIAGNOSIS — G4762 Sleep related leg cramps: Secondary | ICD-10-CM

## 2022-10-21 DIAGNOSIS — Z79899 Other long term (current) drug therapy: Secondary | ICD-10-CM

## 2022-10-21 DIAGNOSIS — E559 Vitamin D deficiency, unspecified: Secondary | ICD-10-CM

## 2022-10-21 MED ORDER — ONETOUCH DELICA PLUS LANCET30G MISC: 5 refills | Status: AC

## 2022-10-21 MED ORDER — METFORMIN HCL ER 500 MG PO TB24
ORAL_TABLET | ORAL | 3 refills | Status: DC
Start: 2022-10-21 — End: 2023-02-01

## 2022-10-21 MED ORDER — ONETOUCH VERIO VI STRP
ORAL_STRIP | 12 refills | Status: AC
Start: 2022-10-21 — End: ?

## 2022-10-21 MED ORDER — LORATADINE 10 MG PO TABS
10.0000 mg | ORAL_TABLET | Freq: Every day | ORAL | 3 refills | Status: DC
Start: 2022-10-21 — End: 2023-10-23

## 2022-10-21 MED ORDER — MONTELUKAST SODIUM 10 MG PO TABS
10.0000 mg | ORAL_TABLET | Freq: Every day | ORAL | 3 refills | Status: DC
Start: 2022-10-21 — End: 2023-10-23

## 2022-10-21 MED ORDER — VENLAFAXINE HCL ER 75 MG PO CP24
75.0000 mg | ORAL_CAPSULE | Freq: Every day | ORAL | 3 refills | Status: DC
Start: 2022-10-21 — End: 2023-10-23

## 2022-10-21 NOTE — Progress Notes (Signed)
[facility] [address] [place], [place] [address]  Internal MEDICINE  Office Visit Note  Patient Name: Mandy Shea  [id number]  [id number]  Date of Service: 10/21/2022  Chief Complaint  Patient presents with  . Diabetes  . Annual Exam    HPI Mandy Shea presents for an annual well visit and physical exam.  Well-appearing 57 y.o. female with anemia, diabetes, sleep apnea, allergic rhinitis, asthma, prurigo nodularis, hysterectomy, cholecystectomy.  Routine CRC screening: due in 2032 Routine mammogram: mammogram scheduled on 11/22/22 Eye exam: overdue since 2022 Foot exam today done  Labs: due for routine labs  New or worsening pain: none  Other concerns: gets leg cramps at night, wants to have her iron level checked.    Current Medication: Outpatient Encounter Medications as of 10/21/2022  Medication Sig  . albuterol (VENTOLIN HFA) 108 (90 Base) MCG/ACT inhaler Inhale 2 puffs into the lungs every 6 (six) hours as needed for wheezing or shortness of breath.  . budesonide-formoterol (SYMBICORT) 160-4.5 MCG/ACT inhaler Inhale 2 puffs into the lungs 2 (two) times daily.  . Dupilumab (DUPIXENT [place]) Inject 300 mg into the skin.  . fluconazole (DIFLUCAN) 150 MG tablet Take 1 tab po once may repeat in 3 days if symptoms persist  . glucose blood (ONETOUCH VERIO) test strip Use 1 test strip to check glucose once daily and as needed  . Lancets (ONETOUCH DELICA PLUS LANCET30G) MISC Use 1 lancet to check glucose once daily and as needed  . loratadine (CLARITIN) 10 MG tablet Take 1 tablet (10 mg total) by mouth daily.  . metFORMIN (GLUCOPHAGE-XR) 500 MG 24 hr tablet TAKE 1 TABLET BY MOUTH EVERY DAY FOR DIABETES  . montelukast (SINGULAIR) 10 MG tablet Take 1 tablet (10 mg total) by mouth daily.  . Semaglutide, 2 MG/DOSE, 8 MG/3ML SOPN Inject 2 mg as directed once a week.  . venlafaxine XR (EFFEXOR-XR) 75 MG 24 hr capsule Take 1 capsule (75 mg total) by mouth daily with breakfast.    No facility-administered encounter medications on file as of 10/21/2022.    Surgical History: Past Surgical History:  Procedure Laterality Date  . CHOLECYSTECTOMY  09-26-13  . COLONOSCOPY WITH PROPOFOL N/A 01/05/2021   Procedure: COLONOSCOPY WITH PROPOFOL;  Surgeon: Wyline Mood, MD;  Location: [facility] ENDOSCOPY;  Service: Gastroenterology;  Laterality: N/A;  . CYSTOSCOPY N/A 01/12/2017   Procedure: CYSTOSCOPY;  Surgeon: Vena Austria, MD;  Location: [facility] ORS;  Service: Gynecology;  Laterality: N/A;  . HYSTEROSCOPY WITH D & C  11/24/2016   Procedure: DILATATION AND CURETTAGE /HYSTEROSCOPY;  Surgeon: Vena Austria, MD;  Location: [facility] ORS;  Service: Gynecology;;  . KNEE SURGERY Right   . LEEP  2010   [facility]  . TONSILLECTOMY  2014  . TOTAL LAPAROSCOPIC HYSTERECTOMY WITH SALPINGECTOMY Right 01/12/2017   Procedure: TOTAL LAPAROSCOPIC HYSTERECTOMY WITH RIGHT SALPINGECTOMY;  Surgeon: Vena Austria, MD;  Location: [facility] ORS;  Service: Gynecology;  Laterality: Right;  . TUBAL LIGATION  1997    Medical History: Past Medical History:  Diagnosis Date  . Anemia    AS A CHILD  . Diabetes mellitus without complication (HCC)   . Fibroids   . Hemorrhoids   . Seasonal allergies   . Sleep apnea    NO CPAP    Family History: Family History  Problem Relation Age of Onset  . Diabetes Sister   . Breast cancer Neg Hx     Social History   Socioeconomic History  . Marital status: Married  Spouse name: Not on file  . Number of children: Not on file  . Years of education: Not on file  . Highest education level: Not on file  Occupational History  . Not on file  Tobacco Use  . Smoking status: Never  . Smokeless tobacco: Never  Vaping Use  . Vaping status: Never Used  Substance and Sexual Activity  . Alcohol use: No  . Drug use: No  . Sexual activity: Yes    Birth control/protection: None  Other Topics Concern  . Not on file  Social History Narrative  . Not on file    Social Determinants of Health   Financial Resource Strain: Not on file  Food Insecurity: Not on file  Transportation Needs: Not on file  Physical Activity: Not on file  Stress: Not on file  Social Connections: Not on file  Intimate Partner Violence: Not on file      Review of Systems  Constitutional:  Negative for activity change, appetite change, chills, fatigue, fever and unexpected weight change.  HENT: Negative.  Negative for congestion, ear pain, rhinorrhea, sore throat and trouble swallowing.   Eyes: Negative.   Respiratory: Negative.  Negative for cough, chest tightness, shortness of breath and wheezing.   Cardiovascular: Negative.  Negative for chest pain.  Gastrointestinal: Negative.  Negative for abdominal pain, blood in stool, constipation, diarrhea, nausea and vomiting.  Endocrine: Negative.   Genitourinary: Negative.  Negative for difficulty urinating, dysuria, frequency, hematuria and urgency.  Musculoskeletal: Negative.  Negative for arthralgias, back pain, joint swelling, myalgias and neck pain.  Skin: Negative.  Negative for rash and wound.  Allergic/Immunologic: Negative.  Negative for immunocompromised state.  Neurological: Negative.  Negative for dizziness, seizures, numbness and headaches.  Hematological: Negative.   Psychiatric/Behavioral: Negative.  Negative for behavioral problems, self-injury and suicidal ideas. The patient is not nervous/anxious.     Vital Signs: BP (!) 124/96   Pulse 69   Temp 98.1 F (36.7 C)   Resp 16   Ht 5\' 4"  (1.626 m)   Wt 155 lb 12.8 oz (70.7 kg)   LMP 12/28/2016   SpO2 97%   BMI 26.74 kg/m    Physical Exam Vitals reviewed.  Constitutional:      General: She is awake. She is not in acute distress.    Appearance: Normal appearance. She is well-developed and well-groomed. She is obese. She is not diaphoretic.  HENT:     Head: Normocephalic and atraumatic.     Right Ear: Tympanic membrane, ear canal and external  ear normal. There is no impacted cerumen.     Left Ear: Tympanic membrane, ear canal and external ear normal. There is no impacted cerumen.     Nose: Nose normal. No congestion or rhinorrhea.     Mouth/Throat:     Lips: Pink.     Mouth: Mucous membranes are moist.     Pharynx: Oropharynx is clear. Uvula midline. No oropharyngeal exudate or posterior oropharyngeal erythema.  Eyes:     General: Lids are normal. Vision grossly intact. Gaze aligned appropriately. No scleral icterus.       Right eye: No discharge.        Left eye: No discharge.     Extraocular Movements: Extraocular movements intact.     Conjunctiva/sclera: Conjunctivae normal.     Pupils: Pupils are equal, round, and reactive to light.     Funduscopic exam:    Right eye: Red reflex present.  Left eye: Red reflex present. Neck:     Thyroid: No thyromegaly.     Vascular: No JVD.     Trachea: Trachea and phonation normal. No tracheal deviation.  Cardiovascular:     Rate and Rhythm: Normal rate and regular rhythm.     Pulses: Normal pulses.     Heart sounds: Normal heart sounds, S1 normal and S2 normal. No murmur heard.    No friction rub. No gallop.  Pulmonary:     Effort: Pulmonary effort is normal. No accessory muscle usage or respiratory distress.     Breath sounds: Normal breath sounds and air entry. No stridor. No wheezing or rales.  Chest:     Chest wall: No tenderness.  Breasts:    Right: Normal.     Left: Normal.  Abdominal:     General: Bowel sounds are normal. There is no distension.     Palpations: Abdomen is soft. There is no shifting dullness, fluid wave, mass or pulsatile mass.     Tenderness: There is no abdominal tenderness. There is no guarding or rebound.  Musculoskeletal:        General: No tenderness or deformity. Normal range of motion.     Cervical back: Normal range of motion and neck supple.     Right lower leg: No edema.     Left lower leg: No edema.  Lymphadenopathy:     Cervical:  No cervical adenopathy.  Skin:    General: Skin is warm and dry.     Capillary Refill: Capillary refill takes less than 2 seconds.     Coloration: Skin is not pale.     Findings: No erythema or rash.  Neurological:     Mental Status: She is alert and oriented to person, place, and time.     Cranial Nerves: No cranial nerve deficit.     Motor: No abnormal muscle tone.     Coordination: Coordination normal.     Gait: Gait normal.     Deep Tendon Reflexes: Reflexes are normal and symmetric.  Psychiatric:        Mood and Affect: Mood normal.        Behavior: Behavior normal. Behavior is cooperative.        Thought Content: Thought content normal.        Judgment: Judgment normal.       Assessment/Plan: There are no diagnoses linked to this encounter.    General Counseling: Wadie verbalizes understanding of the findings of todays visit and agrees with plan of treatment. I have discussed any further diagnostic evaluation that may be needed or ordered today. We also reviewed her medications today. she has been encouraged to call the office with any questions or concerns that should arise related to todays visit.    No orders of the defined types were placed in this encounter.   No orders of the defined types were placed in this encounter.   No follow-ups on file.   Total time spent:30 Minutes Time spent includes review of chart, medications, test results, and follow up plan with the patient.   [place] Controlled Substance Database was reviewed by me.  This patient was seen by Sallyanne Kuster, FNP-C in collaboration with Dr. Beverely Risen as a part of collaborative care agreement.  Prophet Renwick R. Tedd Sias, MSN, FNP-C Internal medicine

## 2022-10-23 ENCOUNTER — Encounter: Payer: Self-pay | Admitting: Nurse Practitioner

## 2022-10-25 LAB — UA/M W/RFLX CULTURE, ROUTINE
Bilirubin, UA: NEGATIVE
Glucose, UA: NEGATIVE
Leukocytes,UA: NEGATIVE
Nitrite, UA: NEGATIVE
RBC, UA: NEGATIVE
Urobilinogen, Ur: 0.2 mg/dL (ref 0.2–1.0)
pH, UA: 5.5 (ref 5.0–7.5)

## 2022-10-25 LAB — MICROSCOPIC EXAMINATION
Casts: NONE SEEN /LPF
RBC, Urine: NONE SEEN /HPF (ref 0–2)

## 2022-10-25 LAB — URINE CULTURE, REFLEX

## 2022-10-26 ENCOUNTER — Other Ambulatory Visit: Payer: Self-pay

## 2022-10-26 ENCOUNTER — Encounter: Payer: Self-pay | Admitting: Emergency Medicine

## 2022-10-26 ENCOUNTER — Emergency Department: Payer: BC Managed Care – PPO

## 2022-10-26 ENCOUNTER — Observation Stay
Admission: EM | Admit: 2022-10-26 | Discharge: 2022-10-27 | Disposition: A | Discharge status: Home / Self Care | Payer: BC Managed Care – PPO | Attending: Internal Medicine | Admitting: Internal Medicine

## 2022-10-26 DIAGNOSIS — R079 Chest pain, unspecified: Secondary | ICD-10-CM | POA: Diagnosis present

## 2022-10-26 DIAGNOSIS — Z794 Long term (current) use of insulin: Secondary | ICD-10-CM

## 2022-10-26 DIAGNOSIS — E876 Hypokalemia: Secondary | ICD-10-CM | POA: Diagnosis not present

## 2022-10-26 DIAGNOSIS — Z96651 Presence of right artificial knee joint: Secondary | ICD-10-CM | POA: Insufficient documentation

## 2022-10-26 DIAGNOSIS — E119 Type 2 diabetes mellitus without complications: Secondary | ICD-10-CM | POA: Diagnosis not present

## 2022-10-26 DIAGNOSIS — R7989 Other specified abnormal findings of blood chemistry: Secondary | ICD-10-CM

## 2022-10-26 DIAGNOSIS — R778 Other specified abnormalities of plasma proteins: Secondary | ICD-10-CM | POA: Insufficient documentation

## 2022-10-26 DIAGNOSIS — J45909 Unspecified asthma, uncomplicated: Secondary | ICD-10-CM | POA: Insufficient documentation

## 2022-10-26 DIAGNOSIS — J4520 Mild intermittent asthma, uncomplicated: Secondary | ICD-10-CM

## 2022-10-26 DIAGNOSIS — Z7984 Long term (current) use of oral hypoglycemic drugs: Secondary | ICD-10-CM | POA: Insufficient documentation

## 2022-10-26 LAB — HEPATIC FUNCTION PANEL
ALT: 26 U/L (ref 0–44)
AST: 30 U/L (ref 15–41)
Albumin: 4.3 g/dL (ref 3.5–5.0)
Alkaline Phosphatase: 72 U/L (ref 38–126)
Bilirubin, Direct: <0.1 mg/dL (ref 0.0–0.2)
Total Bilirubin: 0.5 mg/dL (ref 0.3–1.2)
Total Protein: 7.3 g/dL (ref 6.5–8.1)

## 2022-10-26 LAB — CBC
HCT: 35.9 % — ABNORMAL LOW (ref 36.0–46.0)
Hemoglobin: 12.4 g/dL (ref 12.0–15.0)
MCH: 30.2 pg (ref 26.0–34.0)
MCHC: 34.5 g/dL (ref 30.0–36.0)
MCV: 87.3 fL (ref 80.0–100.0)
Platelets: 305 10*3/uL (ref 150–400)
RBC: 4.11 MIL/uL (ref 3.87–5.11)
RDW: 11.8 % (ref 11.5–15.5)
WBC: 3.9 10*3/uL — ABNORMAL LOW (ref 4.0–10.5)
nRBC: 0 % (ref 0.0–0.2)

## 2022-10-26 LAB — BASIC METABOLIC PANEL
Anion gap: 8 (ref 5–15)
BUN: 23 mg/dL — ABNORMAL HIGH (ref 6–20)
CO2: 26 mmol/L (ref 22–32)
Calcium: 9 mg/dL (ref 8.9–10.3)
Chloride: 104 mmol/L (ref 98–111)
Creatinine, Ser: 0.72 mg/dL (ref 0.44–1.00)
GFR, Estimated: >60 mL/min (ref >60)
Glucose, Bld: 163 mg/dL — ABNORMAL HIGH (ref 70–99)
Potassium: 3.4 mmol/L — ABNORMAL LOW (ref 3.5–5.1)
Sodium: 138 mmol/L (ref 135–145)

## 2022-10-26 LAB — LIPASE, BLOOD: Lipase: 74 U/L — ABNORMAL HIGH (ref 11–51)

## 2022-10-26 LAB — TROPONIN I (HIGH SENSITIVITY)
Troponin I (High Sensitivity): 22 ng/L — ABNORMAL HIGH (ref <18)
Troponin I (High Sensitivity): 23 ng/L — ABNORMAL HIGH (ref <18)

## 2022-10-26 MED ORDER — VENLAFAXINE HCL ER 75 MG PO CP24
75.0000 mg | ORAL_CAPSULE | Freq: Every day | ORAL | Status: DC
  Administered 2022-10-27: 75 mg via ORAL
  Filled 2022-10-26 (×2): qty 1

## 2022-10-26 MED ORDER — MORPHINE SULFATE (PF) 2 MG/ML IV SOLN: 2.0000 mg | INTRAVENOUS | Status: DC | PRN

## 2022-10-26 MED ORDER — NITROGLYCERIN 0.4 MG SL SUBL: 0.4000 mg | SUBLINGUAL_TABLET | SUBLINGUAL | Status: DC | PRN

## 2022-10-26 MED ORDER — LORATADINE 10 MG PO TABS
10.0000 mg | ORAL_TABLET | Freq: Every day | ORAL | Status: DC
  Administered 2022-10-27: 10 mg via ORAL
  Filled 2022-10-26: qty 1

## 2022-10-26 MED ORDER — SODIUM CHLORIDE 0.9 % IV SOLN: INTRAVENOUS | Status: DC

## 2022-10-26 MED ORDER — ONDANSETRON HCL 4 MG/2ML IJ SOLN: 4.0000 mg | Freq: Four times a day (QID) | INTRAMUSCULAR | Status: DC | PRN

## 2022-10-26 MED ORDER — MONTELUKAST SODIUM 10 MG PO TABS
10.0000 mg | ORAL_TABLET | Freq: Every day | ORAL | Status: DC
  Administered 2022-10-27: 10 mg via ORAL
  Filled 2022-10-26: qty 1

## 2022-10-26 MED ORDER — TRAZODONE HCL 50 MG PO TABS: 25.0000 mg | ORAL_TABLET | Freq: Every evening | ORAL | Status: DC | PRN

## 2022-10-26 MED ORDER — ACETAMINOPHEN 325 MG PO TABS: 650.0000 mg | ORAL_TABLET | Freq: Four times a day (QID) | ORAL | Status: DC | PRN

## 2022-10-26 MED ORDER — MAGNESIUM HYDROXIDE 400 MG/5ML PO SUSP: 30.0000 mL | Freq: Every day | ORAL | Status: DC | PRN

## 2022-10-26 MED ORDER — ALBUTEROL SULFATE (2.5 MG/3ML) 0.083% IN NEBU: 2.5000 mg | INHALATION_SOLUTION | Freq: Four times a day (QID) | RESPIRATORY_TRACT | Status: DC | PRN

## 2022-10-26 MED ORDER — NITROGLYCERIN 0.4 MG SL SUBL
0.4000 mg | SUBLINGUAL_TABLET | SUBLINGUAL | Status: DC | PRN
  Administered 2022-10-26: 0.4 mg via SUBLINGUAL
  Filled 2022-10-26: qty 1

## 2022-10-26 MED ORDER — ENOXAPARIN SODIUM 40 MG/0.4ML IJ SOSY
40.0000 mg | PREFILLED_SYRINGE | INTRAMUSCULAR | Status: DC
  Administered 2022-10-27: 40 mg via SUBCUTANEOUS
  Filled 2022-10-26: qty 0.4

## 2022-10-26 MED ORDER — ACETAMINOPHEN 650 MG RE SUPP: 650.0000 mg | Freq: Four times a day (QID) | RECTAL | Status: DC | PRN

## 2022-10-26 MED ORDER — ONDANSETRON HCL 4 MG PO TABS: 4.0000 mg | ORAL_TABLET | Freq: Four times a day (QID) | ORAL | Status: DC | PRN

## 2022-10-26 NOTE — ED Triage Notes (Addendum)
  Patient comes in with central chest pain that started an hour ago.  Patient states the pain does not radiate.  Denies any SOB, N/V, dizziness, diaphoresis, or headache.  Denies any cardiac hx.  Patient states pain is 2/10, sharp.  Hx HTN and T2D.    EMS gave 324 mg chewable aspirin en route.

## 2022-10-26 NOTE — ED Notes (Signed)
  Chest pain resolved by nitroglycerin.  Dr Erma Heritage notified.

## 2022-10-26 NOTE — H&P (Incomplete)
PATIENT NAME: Mandy Shea    MR#:  [id number]  DATE OF BIRTH:  [birth year]  DATE OF ADMISSION:  10/26/2022  PRIMARY CARE PHYSICIAN: Sallyanne Kuster, NP   Patient is coming from: Home  REQUESTING/REFERRING PHYSICIAN: Shaune Pollack, MD  CHIEF COMPLAINT:   Chief Complaint  Patient presents with   Chest Pain    HISTORY OF PRESENT ILLNESS:  Mandy Shea is a 57 y.o. African-American female with medical history significant for essential hypertension and type 2 diabetes mellitus as well as OSA, asthma and seasonal allergies, who presented to the emergency room with acute onset of midsternal chest pain graded 10/10 in severity and felt as a sharp pain with no radiation, and no associated nausea or vomiting or diaphoresis.  She denied any associated dyspnea or palpitations.  No headache or dizziness or blurred vision.  No cough or wheezing or hemoptysis.  No dysuria, oliguria or hematuria or flank pain.  No other bleeding diathesis.  ED Course: When the patient came to the ER, vital signs were within normal.  Labs revealed mild hypokalemia with potassium of 3.4 and a BUN of 23 and lipase 74 with otherwise unremarkable CMP.  High sensitive troponin I was 22 then 23.  CBC showed mild leukopenia of 3.9 and hematocrit of 35.9. EKG as reviewed by me : EKG showed no sinus rhythm with a rate of 71 with low voltage QRS and anteroseptal Q waves Imaging: 2 view chest x-ray showed no acute cardiopulmonary disease.  The patient was given 4 baby aspirin by EMS and was given sublingual nitroglycerin with resolution of her pain.  She will be admitted to a cardiac telemetry observation bed for further evaluation and management. PAST MEDICAL HISTORY:   Past Medical History:  Diagnosis Date   Anemia    AS A CHILD   Diabetes mellitus without complication (HCC)    Fibroids    Hemorrhoids    Seasonal allergies    Sleep apnea    NO CPAP  -Essential hypertension  PAST  SURGICAL HISTORY:   Past Surgical History:  Procedure Laterality Date   CHOLECYSTECTOMY  09-26-13   COLONOSCOPY WITH PROPOFOL N/A 01/05/2021   Procedure: COLONOSCOPY WITH PROPOFOL;  Surgeon: Wyline Mood, MD;  Location: [facility] ENDOSCOPY;  Service: Gastroenterology;  Laterality: N/A;   CYSTOSCOPY N/A 01/12/2017   Procedure: CYSTOSCOPY;  Surgeon: Vena Austria, MD;  Location: [facility] ORS;  Service: Gynecology;  Laterality: N/A;   HYSTEROSCOPY WITH D & C  11/24/2016   Procedure: DILATATION AND CURETTAGE /HYSTEROSCOPY;  Surgeon: Vena Austria, MD;  Location: [facility] ORS;  Service: Gynecology;;   KNEE SURGERY Right    LEEP  2010   [facility]   TONSILLECTOMY  2014   TOTAL LAPAROSCOPIC HYSTERECTOMY WITH SALPINGECTOMY Right 01/12/2017   Procedure: TOTAL LAPAROSCOPIC HYSTERECTOMY WITH RIGHT SALPINGECTOMY;  Surgeon: Vena Austria, MD;  Location: [facility] ORS;  Service: Gynecology;  Laterality: Right;   TUBAL LIGATION  1997    SOCIAL HISTORY:   Social History   Tobacco Use   Smoking status: Never   Smokeless tobacco: Never  Substance Use Topics   Alcohol use: No    FAMILY HISTORY:   Family History  Problem Relation Age of Onset   Diabetes Sister    Breast cancer Neg Hx     DRUG ALLERGIES:   Allergies  Allergen Reactions   Augmentin [Amoxicillin-Pot Clavulanate] Itching    REVIEW OF SYSTEMS:   ROS As per history of present illness.  All pertinent systems were reviewed above. Constitutional, HEENT, cardiovascular, respiratory, GI, GU, musculoskeletal, neuro, psychiatric, endocrine, integumentary and hematologic systems were reviewed and are otherwise negative/unremarkable except for positive findings mentioned above in the HPI.   MEDICATIONS AT HOME:   Prior to Admission medications   Medication Sig Start Date End Date Taking? Authorizing Provider  albuterol (VENTOLIN HFA) 108 (90 Base) MCG/ACT inhaler Inhale 2 puffs into the lungs every 6 (six) hours as needed for wheezing or  shortness of breath. 10/18/21   Sallyanne Kuster, NP  budesonide-formoterol (SYMBICORT) 160-4.5 MCG/ACT inhaler Inhale 2 puffs into the lungs 2 (two) times daily. 07/07/22   Sallyanne Kuster, NP  Dupilumab (DUPIXENT [place]) Inject 300 mg into the skin.    [provider]  glucose blood (ONETOUCH VERIO) test strip Use 1 test strip to check glucose once daily and as needed 10/21/22   Sallyanne Kuster, NP  Lancets (ONETOUCH DELICA PLUS LANCET30G) MISC Use 1 lancet to check glucose once daily and as needed 10/21/22   Sallyanne Kuster, NP  loratadine (CLARITIN) 10 MG tablet Take 1 tablet (10 mg total) by mouth daily. 10/21/22   Sallyanne Kuster, NP  metFORMIN (GLUCOPHAGE-XR) 500 MG 24 hr tablet TAKE 1 TABLET BY MOUTH EVERY DAY FOR DIABETES 10/21/22   Sallyanne Kuster, NP  montelukast (SINGULAIR) 10 MG tablet Take 1 tablet (10 mg total) by mouth daily. 10/21/22   Sallyanne Kuster, NP  Semaglutide, 2 MG/DOSE, 8 MG/3ML SOPN Inject 2 mg as directed once a week. 07/07/22   Sallyanne Kuster, NP  venlafaxine XR (EFFEXOR-XR) 75 MG 24 hr capsule Take 1 capsule (75 mg total) by mouth daily with breakfast. 10/21/22   Sallyanne Kuster, NP      VITAL SIGNS:  Blood pressure (!) 142/82, pulse (!) 57, temperature 97.8 F (36.6 C), temperature source Oral, resp. rate 14, height 5\' 4"  (1.626 m), weight 73.5 kg, last menstrual period 12/28/2016, SpO2 100%.  PHYSICAL EXAMINATION:  Physical Exam  GENERAL:  57 y.o.-year-old African-American female patient lying in the bed with no acute distress.  EYES: Pupils equal, round, reactive to light and accommodation. No scleral icterus. Extraocular muscles intact.  HEENT: Head atraumatic, normocephalic. Oropharynx and nasopharynx clear.  NECK:  Supple, no jugular venous distention. No thyroid enlargement, no tenderness.  LUNGS: Normal breath sounds bilaterally, no wheezing, rales,rhonchi or crepitation. No use of accessory muscles of respiration.  CARDIOVASCULAR: Regular  rate and rhythm, S1, S2 normal. No murmurs, rubs, or gallops.  ABDOMEN: Soft, nondistended, nontender. Bowel sounds present. No organomegaly or mass.  EXTREMITIES: No pedal edema, cyanosis, or clubbing.  NEUROLOGIC: Cranial nerves II through XII are intact. Muscle strength 5/5 in all extremities. Sensation intact. Gait not checked.  PSYCHIATRIC: The patient is alert and oriented x 3.  Normal affect and good eye contact. SKIN: No obvious rash, lesion, or ulcer.   LABORATORY PANEL:   CBC Recent Labs  Lab 10/27/22 0154  WBC 4.2  HGB 11.9*  HCT 35.0*  PLT 289   ------------------------------------------------------------------------------------------------------------------  Chemistries  Recent Labs  Lab 10/26/22 2027 10/27/22 0154  NA 138 140  K 3.4* 3.6  CL 104 104  CO2 26 28  GLUCOSE 163* 153*  BUN 23* 21*  CREATININE 0.72 0.76  CALCIUM 9.0 8.8*  AST 30  --   ALT 26  --   ALKPHOS 72  --   BILITOT 0.5  --    ------------------------------------------------------------------------------------------------------------------  Cardiac Enzymes No results for input(s): "TROPONINI" in the last 168 hours. ------------------------------------------------------------------------------------------------------------------  RADIOLOGY:  DG Chest 2 View  Result Date: 10/26/2022 CLINICAL DATA:  Chest pain EXAM: CHEST - 2 VIEW COMPARISON:  01/18/2022 FINDINGS: The heart size and mediastinal contours are within normal limits. Both lungs are clear. The visualized skeletal structures are unremarkable. IMPRESSION: No active cardiopulmonary disease. Electronically Signed   By: Alcide Clever M.D.   On: 10/26/2022 21:07      IMPRESSION AND PLAN:  Assessment and Plan: * Chest pain - The patient will be admitted to an observation cardiac telemetry bed. - Will follow serial troponins and EKGs. - The patient will be placed on aspirin as well as p.r.n. sublingual nitroglycerin and morphine  sulfate for pain. - We will obtain a cardiology consult in a.m. for further cardiac risk stratification. - I notified Dr. Cristal Deer about the patient.   Hypokalemia - We will replace potassium and check magnesium level.  Asthma, chronic She will continue her inhalers while holding off long-acting beta agonist.  Type II diabetes mellitus (HCC) - The patient will be placed on supplemental coverage with NovoLog. - We will hold off metformin.   DVT prophylaxis: Lovenox. Advanced Care Planning:  Code Status: full code. Family Communication:  The plan of care was discussed in details with the patient (and family). I answered all questions. The patient agreed to proceed with the above mentioned plan. Further management will depend upon hospital course. Disposition Plan: Back to previous home environment Consults called: Cardiology All the records are reviewed and case discussed with ED provider.  Status is: Observation  I certify that at the time of admission, it is my clinical judgment that the patient will require  hospital care extending less than 2 midnights.                            Dispo: The patient is from: Home              Anticipated d/c is to: Home              Patient currently is not medically stable to d/c.              Difficult to place patient: No  Hannah Beat M.D on 10/27/2022 at 3:14 AM  [facility]   From 7 PM-7 AM, contact night-coverage [email or web address]  CC: Primary care physician; Sallyanne Kuster, NP

## 2022-10-26 NOTE — ED Notes (Signed)
ED TO INPATIENT HANDOFF REPORT  ED Nurse Name and Phone #:   Ivonne Branae Crail RN   [phone]  S Name/Age/Gender Mandy Shea 57 y.o. female Room/Bed: ED26A/ED26A  Code Status   Code Status: Full Code  Home/SNF/Other Home Patient oriented to: self, place, time, and situation Is this baseline? Yes   Triage Complete: Triage complete  Chief Complaint Chest pain [R07.9]  Triage Note  Patient comes in with central chest pain that started an hour ago.  Patient states the pain does not radiate.  Denies any SOB, N/V, dizziness, diaphoresis, or headache.  Denies any cardiac hx.  Patient states pain is 2/10, sharp.  Hx HTN and T2D.     Allergies Allergies  Allergen Reactions   Augmentin [Amoxicillin-Pot Clavulanate] Itching    Level of Care/Admitting Diagnosis ED Disposition     ED Disposition  Admit   Condition  --   Comment  Hospital Area: [facility] [[id number]]  Level of Care: Telemetry Cardiac [103]  Covid Evaluation: Asymptomatic - no recent exposure (last 10 days) testing not required  Diagnosis: Chest pain [614431]  Admitting Physician: Hannah Beat [[id number]]  Attending Physician: Hannah Beat [[id number]]          B Medical/Surgery History Past Medical History:  Diagnosis Date   Anemia    AS A CHILD   Diabetes mellitus without complication (HCC)    Fibroids    Hemorrhoids    Seasonal allergies    Sleep apnea    NO CPAP   Past Surgical History:  Procedure Laterality Date   CHOLECYSTECTOMY  09-26-13   COLONOSCOPY WITH PROPOFOL N/A 01/05/2021   Procedure: COLONOSCOPY WITH PROPOFOL;  Surgeon: Wyline Mood, MD;  Location: [facility] ENDOSCOPY;  Service: Gastroenterology;  Laterality: N/A;   CYSTOSCOPY N/A 01/12/2017   Procedure: CYSTOSCOPY;  Surgeon: Vena Austria, MD;  Location: [facility] ORS;  Service: Gynecology;  Laterality: N/A;   HYSTEROSCOPY WITH D & C  11/24/2016   Procedure: DILATATION AND CURETTAGE /HYSTEROSCOPY;  Surgeon: Vena Austria, MD;  Location: [facility] ORS;  Service: Gynecology;;   KNEE SURGERY Right    LEEP  2010   [facility]   TONSILLECTOMY  2014   TOTAL LAPAROSCOPIC HYSTERECTOMY WITH SALPINGECTOMY Right 01/12/2017   Procedure: TOTAL LAPAROSCOPIC HYSTERECTOMY WITH RIGHT SALPINGECTOMY;  Surgeon: Vena Austria, MD;  Location: [facility] ORS;  Service: Gynecology;  Laterality: Right;   TUBAL LIGATION  1997     A IV Location/Drains/Wounds Patient Lines/Drains/Airways Status     Active Line/Drains/Airways     Name Placement date Placement time Site Days   Peripheral IV 10/26/22 18 G 1" Anterior;Left;Proximal Forearm 10/26/22  2026  Forearm  less than 1            Intake/Output Last 24 hours No intake or output data in the 24 hours ending 10/26/22 2356  Labs/Imaging Results for orders placed or performed during the hospital encounter of 10/26/22 (from the past 48 hour(s))  Basic metabolic panel     Status: Abnormal   Collection Time: 10/26/22  8:27 PM  Result Value Ref Range   Sodium 138 135 - 145 mmol/L   Potassium 3.4 (L) 3.5 - 5.1 mmol/L   Chloride 104 98 - 111 mmol/L   CO2 26 22 - 32 mmol/L   Glucose, Bld 163 (H) 70 - 99 mg/dL    Comment: Glucose reference range applies only to samples taken after fasting for at least 8 hours.   BUN 23 (H) 6 - 20  mg/dL   Creatinine, Ser 6.01 0.44 - 1.00 mg/dL   Calcium 9.0 8.9 - 09.3 mg/dL   GFR, Estimated >23 >55 mL/min    Comment: (NOTE) Calculated using the CKD-EPI Creatinine Equation (2021)    Anion gap 8 5 - 15    Comment: Performed at [facility], [address] [place], [place] [address]  CBC     Status: Abnormal   Collection Time: 10/26/22  8:27 PM  Result Value Ref Range   WBC 3.9 (L) 4.0 - 10.5 K/uL   RBC 4.11 3.87 - 5.11 MIL/uL   Hemoglobin 12.4 12.0 - 15.0 g/dL   HCT 25.4 (L) 27.0 - 62.3 %   MCV 87.3 80.0 - 100.0 fL   MCH 30.2 26.0 - 34.0 pg   MCHC 34.5 30.0 - 36.0 g/dL   RDW 76.2 83.1 - 51.7 %   Platelets 305 150 - 400 K/uL    nRBC 0.0 0.0 - 0.2 %    Comment: Performed at [facility], [address] [place], [place] [address]  Troponin I (High Sensitivity)     Status: Abnormal   Collection Time: 10/26/22  8:27 PM  Result Value Ref Range   Troponin I (High Sensitivity) 22 (H) <18 ng/L    Comment: (NOTE) Elevated high sensitivity troponin I (hsTnI) values and significant  changes across serial measurements may suggest ACS but many other  chronic and acute conditions are known to elevate hsTnI results.  Refer to the "Links" section for chest pain algorithms and additional  guidance. Performed at [facility], [address] [place], [place] [address]   Hepatic function panel     Status: None   Collection Time: 10/26/22  8:27 PM  Result Value Ref Range   Total Protein 7.3 6.5 - 8.1 g/dL   Albumin 4.3 3.5 - 5.0 g/dL   AST 30 15 - 41 U/L   ALT 26 0 - 44 U/L   Alkaline Phosphatase 72 38 - 126 U/L   Total Bilirubin 0.5 0.3 - 1.2 mg/dL   Bilirubin, Direct <2.6 0.0 - 0.2 mg/dL   Indirect Bilirubin NOT CALCULATED 0.3 - 0.9 mg/dL    Comment: Performed at [facility], [address] [place], [place] [address]  Lipase, blood     Status: Abnormal   Collection Time: 10/26/22  8:27 PM  Result Value Ref Range   Lipase 74 (H) 11 - 51 U/L    Comment: Performed at [facility], [address] [place], [place] [address]  Troponin I (High Sensitivity)     Status: Abnormal   Collection Time: 10/26/22 10:18 PM  Result Value Ref Range   Troponin I (High Sensitivity) 23 (H) <18 ng/L    Comment: (NOTE) Elevated high sensitivity troponin I (hsTnI) values and significant  changes across serial measurements may suggest ACS but many other  chronic and acute conditions are known to elevate hsTnI results.  Refer to the "Links" section for chest pain algorithms and additional  guidance. Performed at [facility], [address] [place], [place] [address]    DG Chest 2  View  Result Date: 10/26/2022 CLINICAL DATA:  Chest pain EXAM: CHEST - 2 VIEW COMPARISON:  01/18/2022 FINDINGS: The heart size and mediastinal contours are within normal limits. Both lungs are clear. The visualized skeletal structures are unremarkable. IMPRESSION: No active cardiopulmonary disease. Electronically Signed   By: Alcide Clever M.D.   On: 10/26/2022 21:07    Pending Labs  Unresulted Labs (From admission, onward)     Start     Ordered   10/27/22 0500  Basic metabolic panel  Tomorrow morning,   R        10/26/22 2338   10/27/22 0500  CBC  Tomorrow morning,   R        10/26/22 2338   10/26/22 2335  HIV Antibody (routine testing w rflx)  (HIV Antibody (Routine testing w reflex) panel)  Once,   R        10/26/22 2338            Vitals/Pain Today's Vitals   10/26/22 2208 10/26/22 2230 10/26/22 2300 10/26/22 2330  BP:  134/81 (!) 141/91 126/88  Pulse:  67 69 63  Resp:   11 14  Temp:    98 F (36.7 C)  TempSrc:    Oral  SpO2:  100% 100% 100%  Weight:      Height:      PainSc: 0-No pain       Isolation Precautions No active isolations  Medications Medications  nitroGLYCERIN (NITROSTAT) SL tablet 0.4 mg (0.4 mg Sublingual Given 10/26/22 2120)  venlafaxine XR (EFFEXOR-XR) 24 hr capsule 75 mg (has no administration in time range)  albuterol (VENTOLIN HFA) 108 (90 Base) MCG/ACT inhaler 2 puff (has no administration in time range)  loratadine (CLARITIN) tablet 10 mg (has no administration in time range)  montelukast (SINGULAIR) tablet 10 mg (has no administration in time range)  enoxaparin (LOVENOX) injection 40 mg (has no administration in time range)  0.9 %  sodium chloride infusion (has no administration in time range)  acetaminophen (TYLENOL) tablet 650 mg (has no administration in time range)    Or  acetaminophen (TYLENOL) suppository 650 mg (has no administration in time range)  traZODone (DESYREL) tablet 25 mg (has no administration in time range)  magnesium  hydroxide (MILK OF MAGNESIA) suspension 30 mL (has no administration in time range)  ondansetron (ZOFRAN) tablet 4 mg (has no administration in time range)    Or  ondansetron (ZOFRAN) injection 4 mg (has no administration in time range)  morphine (PF) 2 MG/ML injection 2 mg (has no administration in time range)  nitroGLYCERIN (NITROSTAT) SL tablet 0.4 mg (has no administration in time range)    Mobility walks     Focused Assessments Cardiac Assessment Handoff:  Cardiac Rhythm: Normal sinus rhythm Lab Results  Component Value Date   TROPONINI < 0.02 09/22/2013   No results found for: "DDIMER" Does the Patient currently have chest pain? No    R Recommendations: See Admitting Provider Note  Report given to:   Additional Notes:

## 2022-10-26 NOTE — ED Provider Notes (Signed)
[facility] Provider Note    Event Date/Time   First MD Initiated Contact with Patient 10/26/22 2017     (approximate)   History   Chest Pain   HPI  Mandy Shea is a 57 y.o. female  here with chest pain. Pt reports that around 2 hours ago she was at rest when she developed acute, severe, chest pressure in her substernal area. No associated SOB, nausea, vomiting. Sx have persisted but mildly improved after receiving ASA 325 with EMS. No h/o CAD. No recent fever, chills, medication changes. No sx prior to tonight. She was not eating at the time, denies any recent pain/discomfort with eatnig.       Physical Exam   Triage Vital Signs: ED Triage Vitals  Encounter Vitals Group     BP      Systolic BP Percentile      Diastolic BP Percentile      Pulse      Resp      Temp      Temp src      SpO2      Weight      Height      Head Circumference      Peak Flow      Pain Score      Pain Loc      Pain Education      Exclude from Growth Chart     Most recent vital signs: Vitals:   10/26/22 2045 10/26/22 2100  BP:  131/75  Pulse: 78 80  Resp: 11 13  Temp:    SpO2: 100% 100%     General: Awake, no distress.  CV:  Good peripheral perfusion. RRR.  Resp:  Normal work of breathing. Lungs CTAB. No w/r/r. Abd:  No distention. No tenderness. No epigastric or RUQ pain. Other:  No LE edema.   ED Results / Procedures / Treatments   Labs (all labs ordered are listed, but only abnormal results are displayed) Labs Reviewed  BASIC METABOLIC PANEL - Abnormal; Notable for the following components:      Result Value   Potassium 3.4 (*)    Glucose, Bld 163 (*)    BUN 23 (*)    All other components within normal limits  CBC - Abnormal; Notable for the following components:   WBC 3.9 (*)    HCT 35.9 (*)    All other components within normal limits  TROPONIN I (HIGH SENSITIVITY) - Abnormal; Notable for the following components:   Troponin I  (High Sensitivity) 22 (*)    All other components within normal limits  HEPATIC FUNCTION PANEL  LIPASE, BLOOD     EKG Normal sinus rhythm, VR 71. PR 195, QRS 92, QTc 438. No acute ST elevations or depressions.   RADIOLOGY CXR: Normal   I also independently reviewed and agree with radiologist interpretations.   PROCEDURES:  Critical Care performed: No  .1-3 Lead EKG Interpretation  Performed by: Shaune Pollack, MD Authorized by: Shaune Pollack, MD     Interpretation: normal     ECG rate:  70-90   ECG rate assessment: normal     Rhythm: sinus rhythm     Ectopy: none     Conduction: normal   Comments:     Indication: Chest pain    MEDICATIONS ORDERED IN ED: Medications  nitroGLYCERIN (NITROSTAT) SL tablet 0.4 mg (0.4 mg Sublingual Given 10/26/22 2120)     IMPRESSION / MDM / ASSESSMENT AND PLAN /  ED COURSE  I reviewed the triage vital signs and the nursing notes.                              Differential diagnosis includes, but is not limited to, ACS, angina, esophageal spasm, gastritis/GERD, MSK chest wall pain, PNA, PTX  Patient's presentation is most consistent with acute presentation with potential threat to life or bodily function.  The patient is on the cardiac monitor to evaluate for evidence of arrhythmia and/or significant heart rate changes  57 yo F here with substernal chest pain/pressure, resolved with nitroglycerin. Pt has a h/o HTN, DM, obesity. No h/o CAD. EKG here shows non specific changes but no ST elevations. Trop 22. CXR is clear. CBC without leukocytosis or anemia. BMP unremarkable.  Given +trop, resolution of pain with nitroglycerin, risk factors, will admit for observation. Pt in agreement with this plan.    FINAL CLINICAL IMPRESSION(S) / ED DIAGNOSES   Final diagnoses:  Chest pain, unspecified type  Elevated troponin     Rx / DC Orders   ED Discharge Orders     None        Note:  This document was prepared using Dragon  voice recognition software and may include unintentional dictation errors.   Shaune Pollack, MD 10/26/22 2141

## 2022-10-27 ENCOUNTER — Observation Stay (HOSPITAL_BASED_OUTPATIENT_CLINIC_OR_DEPARTMENT_OTHER)
Admit: 2022-10-27 | Discharge: 2022-10-27 | Disposition: A | Discharge status: Home / Self Care | Payer: BC Managed Care – PPO | Attending: Cardiovascular Disease | Admitting: Cardiovascular Disease

## 2022-10-27 DIAGNOSIS — R0789 Other chest pain: Secondary | ICD-10-CM

## 2022-10-27 DIAGNOSIS — E785 Hyperlipidemia, unspecified: Secondary | ICD-10-CM | POA: Diagnosis not present

## 2022-10-27 DIAGNOSIS — I10 Essential (primary) hypertension: Secondary | ICD-10-CM | POA: Diagnosis not present

## 2022-10-27 DIAGNOSIS — J45909 Unspecified asthma, uncomplicated: Secondary | ICD-10-CM | POA: Insufficient documentation

## 2022-10-27 DIAGNOSIS — E119 Type 2 diabetes mellitus without complications: Secondary | ICD-10-CM | POA: Diagnosis not present

## 2022-10-27 DIAGNOSIS — E876 Hypokalemia: Secondary | ICD-10-CM

## 2022-10-27 DIAGNOSIS — R079 Chest pain, unspecified: Secondary | ICD-10-CM

## 2022-10-27 DIAGNOSIS — R7989 Other specified abnormal findings of blood chemistry: Secondary | ICD-10-CM

## 2022-10-27 LAB — CBC
HCT: 35 % — ABNORMAL LOW (ref 36.0–46.0)
Hemoglobin: 11.9 g/dL — ABNORMAL LOW (ref 12.0–15.0)
MCH: 30.1 pg (ref 26.0–34.0)
MCHC: 34 g/dL (ref 30.0–36.0)
MCV: 88.6 fL (ref 80.0–100.0)
Platelets: 289 K/uL (ref 150–400)
RBC: 3.95 MIL/uL (ref 3.87–5.11)
RDW: 11.6 % (ref 11.5–15.5)
WBC: 4.2 K/uL (ref 4.0–10.5)
nRBC: 0 % (ref 0.0–0.2)

## 2022-10-27 LAB — ECHOCARDIOGRAM COMPLETE
AR max vel: 2.82 cm2
AV Area VTI: 3.17 cm2
AV Area mean vel: 2.52 cm2
AV Mean grad: 2 mmHg
AV Peak grad: 3.5 mmHg
Ao pk vel: 0.93 m/s
Area-P 1/2: 3.61 cm2
Height: 64 in
MV VTI: 2.78 cm2
S' Lateral: 2.7 cm
Weight: 2592.61 [oz_av]

## 2022-10-27 LAB — BASIC METABOLIC PANEL WITH GFR
Anion gap: 8 (ref 5–15)
BUN: 21 mg/dL — ABNORMAL HIGH (ref 6–20)
CO2: 28 mmol/L (ref 22–32)
Calcium: 8.8 mg/dL — ABNORMAL LOW (ref 8.9–10.3)
Chloride: 104 mmol/L (ref 98–111)
Creatinine, Ser: 0.76 mg/dL (ref 0.44–1.00)
GFR, Estimated: >60 mL/min
Glucose, Bld: 153 mg/dL — ABNORMAL HIGH (ref 70–99)
Potassium: 3.6 mmol/L (ref 3.5–5.1)
Sodium: 140 mmol/L (ref 135–145)

## 2022-10-27 LAB — TROPONIN I (HIGH SENSITIVITY)
Troponin I (High Sensitivity): 24 ng/L — ABNORMAL HIGH
Troponin I (High Sensitivity): 23 ng/L — ABNORMAL HIGH (ref <18)

## 2022-10-27 LAB — HIV ANTIBODY (ROUTINE TESTING W REFLEX): HIV Screen 4th Generation wRfx: NONREACTIVE

## 2022-10-27 LAB — MAGNESIUM: Magnesium: 2.2 mg/dL (ref 1.7–2.4)

## 2022-10-27 MED ORDER — POTASSIUM CHLORIDE 20 MEQ PO PACK
40.0000 meq | PACK | Freq: Once | ORAL | Status: AC
  Administered 2022-10-27: 40 meq via ORAL
  Filled 2022-10-27: qty 2

## 2022-10-27 NOTE — Progress Notes (Signed)
*  PRELIMINARY RESULTS* Echocardiogram 2D Echocardiogram has been performed.  Cristela Blue 10/27/2022, 9:36 AM

## 2022-10-27 NOTE — Assessment & Plan Note (Signed)
-   The patient will be admitted to an observation cardiac telemetry bed. - Will follow serial troponins and EKGs. - The patient will be placed on aspirin as well as p.r.n. sublingual nitroglycerin and morphine sulfate for pain. - We will obtain a cardiology consult in a.m. for further cardiac risk stratification. - I notified Dr. Cristal Deer about the patient.

## 2022-10-27 NOTE — Plan of Care (Signed)
  Problem: Health Behavior/Discharge Planning: Goal: Ability to manage health-related needs will improve Outcome: Progressing   Problem: Clinical Measurements: Goal: Ability to maintain clinical measurements within normal limits will improve Outcome: Progressing Goal: Will remain free from infection Outcome: Progressing Goal: Diagnostic test results will improve Outcome: Progressing   

## 2022-10-27 NOTE — Assessment & Plan Note (Signed)
She will continue her inhalers while holding off long-acting beta agonist.

## 2022-10-27 NOTE — Plan of Care (Signed)
  Problem: Education: Goal: Knowledge of General Education information will improve Description: Including pain rating scale, medication(s)/side effects and non-pharmacologic comfort measures 10/27/2022 1455 by Lars Pinks, RN Outcome: Adequate for Discharge 10/27/2022 1455 by Lars Pinks, RN Outcome: Adequate for Discharge   Problem: Health Behavior/Discharge Planning: Goal: Ability to manage health-related needs will improve 10/27/2022 1455 by Lars Pinks, RN Outcome: Adequate for Discharge 10/27/2022 1455 by Lars Pinks, RN Outcome: Adequate for Discharge   Problem: Clinical Measurements: Goal: Ability to maintain clinical measurements within normal limits will improve 10/27/2022 1455 by Lars Pinks, RN Outcome: Adequate for Discharge 10/27/2022 1455 by Lars Pinks, RN Outcome: Adequate for Discharge Goal: Will remain free from infection 10/27/2022 1455 by Lars Pinks, RN Outcome: Adequate for Discharge 10/27/2022 1455 by Lars Pinks, RN Outcome: Adequate for Discharge Goal: Diagnostic test results will improve 10/27/2022 1455 by Lars Pinks, RN Outcome: Adequate for Discharge 10/27/2022 1455 by Lars Pinks, RN Outcome: Adequate for Discharge Goal: Respiratory complications will improve 10/27/2022 1455 by Lars Pinks, RN Outcome: Adequate for Discharge 10/27/2022 1455 by Lars Pinks, RN Outcome: Adequate for Discharge Goal: Cardiovascular complication will be avoided 10/27/2022 1455 by Lars Pinks, RN Outcome: Adequate for Discharge 10/27/2022 1455 by Lars Pinks, RN Outcome: Adequate for Discharge   Problem: Activity: Goal: Risk for activity intolerance will decrease 10/27/2022 1455 by Lars Pinks, RN Outcome: Adequate for Discharge 10/27/2022 1455 by Lars Pinks, RN Outcome: Adequate for Discharge   Problem: Nutrition: Goal: Adequate nutrition will be maintained 10/27/2022 1455 by Lars Pinks,  RN Outcome: Adequate for Discharge 10/27/2022 1455 by Lars Pinks, RN Outcome: Adequate for Discharge   Problem: Coping: Goal: Level of anxiety will decrease 10/27/2022 1455 by Lars Pinks, RN Outcome: Adequate for Discharge 10/27/2022 1455 by Lars Pinks, RN Outcome: Adequate for Discharge   Problem: Elimination: Goal: Will not experience complications related to bowel motility 10/27/2022 1455 by Lars Pinks, RN Outcome: Adequate for Discharge 10/27/2022 1455 by Lars Pinks, RN Outcome: Adequate for Discharge Goal: Will not experience complications related to urinary retention 10/27/2022 1455 by Lars Pinks, RN Outcome: Adequate for Discharge 10/27/2022 1455 by Lars Pinks, RN Outcome: Adequate for Discharge   Problem: Pain Managment: Goal: General experience of comfort will improve 10/27/2022 1455 by Lars Pinks, RN Outcome: Adequate for Discharge 10/27/2022 1455 by Lars Pinks, RN Outcome: Adequate for Discharge   Problem: Safety: Goal: Ability to remain free from injury will improve 10/27/2022 1455 by Lars Pinks, RN Outcome: Adequate for Discharge 10/27/2022 1455 by Lars Pinks, RN Outcome: Adequate for Discharge   Problem: Skin Integrity: Goal: Risk for impaired skin integrity will decrease 10/27/2022 1455 by Lars Pinks, RN Outcome: Adequate for Discharge 10/27/2022 1455 by Lars Pinks, RN Outcome: Adequate for Discharge

## 2022-10-27 NOTE — Assessment & Plan Note (Signed)
-  We will replace potassium and check magnesium level. 

## 2022-10-27 NOTE — Discharge Summary (Signed)
Physician Discharge Summary   Patient: Mandy Shea MRN: [id number] DOB: December 23, 1965  Admit date:     10/26/2022  Discharge date: 10/27/22  Discharge Physician: Lurene Shadow   PCP: Sallyanne Kuster, NP   Recommendations at discharge:   Follow-up with cardiologist as outpatient (office will call to schedule)  Discharge Diagnoses: Principal Problem:   Chest pain Active Problems:   Hypokalemia   Type II diabetes mellitus (HCC)   Asthma, chronic   Elevated troponin  Resolved Problems:   * No resolved hospital problems. [place] Hospital Course:  Mandy Shea is a 57 year old woman with medical history significant for type II DM, hypertension, hyperlipidemia, fibroid uterus, asthma, seasonal allergies, who presented to the hospital because of chest pain.  She was admitted to telemetry for observation.  Troponins were marginally elevated and clinical features were not consistent with acute coronary syndrome. EKG showed normal sinus rhythm and nonspecific T wave changes (personally reviewed by me). 2D echo showed EF estimate 60 to 65%, grade 1 diastolic dysfunction, mild to moderate TR.  He was evaluated by Dr. Mariah Milling, cardiologist.  Case was discussed with Dr. Mariah Milling who said the patient could be discharged from his standpoint.  He will see patient in the office for further management.  She had mild hypokalemia that was repleted.  Patient said she is feeling better.  Chest pain has resolved.  She is deemed stable for discharge to home today.       Consultants: Cardiologist Procedures performed: None Disposition: Home Diet recommendation:  Discharge Diet Orders (From admission, onward)     Start     Ordered   10/27/22 0000  Diet - low sodium heart healthy        10/27/22 1239   10/27/22 0000  Diet Carb Modified        10/27/22 1239           Cardiac and Carb modified diet DISCHARGE MEDICATION: Allergies as of 10/27/2022       Reactions   Augmentin  [amoxicillin-pot Clavulanate] Itching        Medication List     TAKE these medications    albuterol 108 (90 Base) MCG/ACT inhaler Commonly known as: VENTOLIN HFA Inhale 2 puffs into the lungs every 6 (six) hours as needed for wheezing or shortness of breath.   budesonide-formoterol 160-4.5 MCG/ACT inhaler Commonly known as: SYMBICORT Inhale 2 puffs into the lungs 2 (two) times daily.   DUPIXENT  Inject 300 mg into the skin.   loratadine 10 MG tablet Commonly known as: CLARITIN Take 1 tablet (10 mg total) by mouth daily.   metFORMIN 500 MG 24 hr tablet Commonly known as: GLUCOPHAGE-XR TAKE 1 TABLET BY MOUTH EVERY DAY FOR DIABETES   montelukast 10 MG tablet Commonly known as: SINGULAIR Take 1 tablet (10 mg total) by mouth daily.   OneTouch Delica Plus Lancet30G Misc Use 1 lancet to check glucose once daily and as needed   OneTouch Verio test strip Generic drug: glucose blood Use 1 test strip to check glucose once daily and as needed   Semaglutide (2 MG/DOSE) 8 MG/3ML Sopn Inject 2 mg as directed once a week.   venlafaxine XR 75 MG 24 hr capsule Commonly known as: EFFEXOR-XR Take 1 capsule (75 mg total) by mouth daily with breakfast.        Discharge Exam: Filed Weights   10/26/22 2024 10/27/22 0018  Weight: 70.3 kg 73.5 kg   GEN: NAD SKIN: Warm and dry EYES:  No pallor or icterus ENT: MMM CV: RRR PULM: CTA B ABD: soft, ND, NT, +BS CNS: AAO x 3, non focal EXT: No edema or tenderness   Condition at discharge: good  The results of significant diagnostics from this hospitalization (including imaging, microbiology, ancillary and laboratory) are listed below for reference.   Imaging Studies: ECHOCARDIOGRAM COMPLETE  Result Date: 10/27/2022    ECHOCARDIOGRAM REPORT   Patient Name:   Mandy Shea Date of Exam: 10/27/2022 Medical Rec #:  [id number]         Height:       64.0 in Accession #:    [id number]        Weight:       162.0 lb Date of Birth:   1966/01/12        BSA:          1.789 m Patient Age:    57 years          BP:           121/85 mmHg Patient Gender: F                 HR:           71 bpm. Exam Location:  [facility] Procedure: 2D Echo, Color Doppler and Cardiac Doppler Indications:     Chest pain R07.9  History:         Patient has no prior history of Echocardiogram examinations.                  Risk Factors:Diabetes and Sleep Apnea.  Sonographer:     Cristela Blue Referring Phys:  [id number] Antonieta Iba Diagnosing Phys: Julien Nordmann MD IMPRESSIONS  1. Left ventricular ejection fraction, by estimation, is 60 to 65%. The left ventricle has normal function. The left ventricle has no regional wall motion abnormalities. There is mild left ventricular hypertrophy. Left ventricular diastolic parameters are consistent with Grade I diastolic dysfunction (impaired relaxation).  2. Right ventricular systolic function is normal. The right ventricular size is normal. There is normal pulmonary artery systolic pressure. The estimated right ventricular systolic pressure is 26.3 mmHg.  3. The mitral valve is normal in structure. No evidence of mitral valve regurgitation. No evidence of mitral stenosis.  4. Tricuspid valve regurgitation is mild to moderate.  5. The aortic valve has an indeterminant number of cusps. Aortic valve regurgitation is not visualized. No aortic stenosis is present.  6. The inferior vena cava is normal in size with greater than 50% respiratory variability, suggesting right atrial pressure of 3 mmHg. FINDINGS  Left Ventricle: Left ventricular ejection fraction, by estimation, is 60 to 65%. The left ventricle has normal function. The left ventricle has no regional wall motion abnormalities. The left ventricular internal cavity size was normal in size. There is  mild left ventricular hypertrophy. Left ventricular diastolic parameters are consistent with Grade I diastolic dysfunction (impaired relaxation). Right Ventricle: The right ventricular size  is normal. No increase in right ventricular wall thickness. Right ventricular systolic function is normal. There is normal pulmonary artery systolic pressure. The tricuspid regurgitant velocity is 2.31 m/s, and  with an assumed right atrial pressure of 5 mmHg, the estimated right ventricular systolic pressure is 26.3 mmHg. Left Atrium: Left atrial size was normal in size. Right Atrium: Right atrial size was normal in size. Pericardium: There is no evidence of pericardial effusion. Mitral Valve: The mitral valve is normal in structure. No evidence of mitral valve regurgitation. No evidence  of mitral valve stenosis. MV peak gradient, 2.2 mmHg. The mean mitral valve gradient is 1.0 mmHg. Tricuspid Valve: The tricuspid valve is normal in structure. Tricuspid valve regurgitation is mild to moderate. No evidence of tricuspid stenosis. Aortic Valve: The aortic valve has an indeterminant number of cusps. Aortic valve regurgitation is not visualized. No aortic stenosis is present. Aortic valve mean gradient measures 2.0 mmHg. Aortic valve peak gradient measures 3.5 mmHg. Aortic valve area, by VTI measures 3.17 cm. Pulmonic Valve: The pulmonic valve was normal in structure. Pulmonic valve regurgitation is not visualized. No evidence of pulmonic stenosis. Aorta: The aortic root is normal in size and structure. Venous: The inferior vena cava is normal in size with greater than 50% respiratory variability, suggesting right atrial pressure of 3 mmHg. IAS/Shunts: No atrial level shunt detected by color flow Doppler.  LEFT VENTRICLE PLAX 2D LVIDd:         3.80 cm   Diastology LVIDs:         2.70 cm   LV e' medial:    8.16 cm/s LV PW:         1.10 cm   LV E/e' medial:  7.9 LV IVS:        1.70 cm   LV e' lateral:   14.60 cm/s LVOT diam:     2.00 cm   LV E/e' lateral: 4.4 LV SV:         60 LV SV Index:   34 LVOT Area:     3.14 cm  RIGHT VENTRICLE RV Basal diam:  3.40 cm RV Mid diam:    3.10 cm LEFT ATRIUM             Index         RIGHT ATRIUM           Index LA diam:        2.30 cm 1.29 cm/m   RA Area:     13.80 cm LA Vol (A2C):   35.0 ml 19.56 ml/m  RA Volume:   35.70 ml  19.96 ml/m LA Vol (A4C):   33.7 ml 18.84 ml/m LA Biplane Vol: 34.2 ml 19.12 ml/m  AORTIC VALVE AV Area (Vmax):    2.82 cm AV Area (Vmean):   2.52 cm AV Area (VTI):     3.17 cm AV Vmax:           93.30 cm/s AV Vmean:          67.100 cm/s AV VTI:            0.189 m AV Peak Grad:      3.5 mmHg AV Mean Grad:      2.0 mmHg LVOT Vmax:         83.80 cm/s LVOT Vmean:        53.800 cm/s LVOT VTI:          0.191 m LVOT/AV VTI ratio: 1.01  AORTA Ao Root diam: 3.13 cm MITRAL VALVE               TRICUSPID VALVE MV Area (PHT): 3.61 cm    TR Peak grad:   21.3 mmHg MV Area VTI:   2.78 cm    TR Vmax:        231.00 cm/s MV Peak grad:  2.2 mmHg MV Mean grad:  1.0 mmHg    SHUNTS MV Vmax:       0.75 m/s    Systemic VTI:  0.19 m MV Vmean:  47.2 cm/s   Systemic Diam: 2.00 cm MV Decel Time: 210 msec MV E velocity: 64.50 cm/s MV A velocity: 74.90 cm/s MV E/A ratio:  0.86 Julien Nordmann MD Electronically signed by Julien Nordmann MD Signature Date/Time: 10/27/2022/10:59:56 AM    Final    DG Chest 2 View  Result Date: 10/26/2022 CLINICAL DATA:  Chest pain EXAM: CHEST - 2 VIEW COMPARISON:  01/18/2022 FINDINGS: The heart size and mediastinal contours are within normal limits. Both lungs are clear. The visualized skeletal structures are unremarkable. IMPRESSION: No active cardiopulmonary disease. Electronically Signed   By: Alcide Clever M.D.   On: 10/26/2022 21:07    Microbiology: Results for orders placed or performed in visit on 10/21/22  Microscopic Examination     Status: Abnormal   Collection Time: 10/21/22 11:43 AM   Urine  Result Value Ref Range Status   WBC, UA 11-30 (A) 0 - 5 /hpf Final   RBC, Urine None seen 0 - 2 /hpf Final   Epithelial Cells (non renal) 0-10 0 - 10 /hpf Final   Casts None seen None seen /lpf Final   Crystals Present (A) N/A Final   Crystal Type  Calcium Oxalate N/A Final   Bacteria, UA Few None seen/Few Final  Urine Culture, Reflex     Status: None   Collection Time: 10/21/22 11:43 AM   Urine  Result Value Ref Range Status   Urine Culture, Routine Final report  Final   Organism ID, Bacteria Comment  Final    Comment: Mixed urogenital flora Less than 10,000 colonies/mL     Labs: CBC: Recent Labs  Lab 10/26/22 2027 10/27/22 0154  WBC 3.9* 4.2  HGB 12.4 11.9*  HCT 35.9* 35.0*  MCV 87.3 88.6  PLT 305 289   Basic Metabolic Panel: Recent Labs  Lab 10/26/22 2027 10/27/22 0154 10/27/22 0437  NA 138 140  --   K 3.4* 3.6  --   CL 104 104  --   CO2 26 28  --   GLUCOSE 163* 153*  --   BUN 23* 21*  --   CREATININE 0.72 0.76  --   CALCIUM 9.0 8.8*  --   MG  --   --  2.2   Liver Function Tests: Recent Labs  Lab 10/26/22 2027  AST 30  ALT 26  ALKPHOS 72  BILITOT 0.5  PROT 7.3  ALBUMIN 4.3   CBG: No results for input(s): "GLUCAP" in the last 168 hours.  Discharge time spent: greater than 30 minutes.  Signed: Lurene Shadow, MD [facility] 10/27/2022

## 2022-10-27 NOTE — TOC CM/SW Note (Signed)
Transition of Care [facility]) - Inpatient Brief Assessment   Patient Details  Name: Mandy Shea MRN: [id number] Date of Birth: 04-14-1965  Transition of Care [facility]) CM/SW Contact:    Margarito Liner, LCSW Phone Number: 10/27/2022, 12:47 PM   Clinical Narrative: Patient has orders to discharge home today. Chart reviewed. No TOC needs identified. CSW signing off.  Transition of Care Asessment: Insurance and Status: Insurance coverage has been reviewed Patient has primary care physician: Yes Home environment has been reviewed: Single family home Prior level of function:: Not documented Prior/Current Home Services: No current home services Social Determinants of Health Reivew: SDOH reviewed no interventions necessary Readmission risk has been reviewed: Yes Transition of care needs: no transition of care needs at this time

## 2022-10-27 NOTE — Consult Note (Signed)
Cardiology Consultation   Patient ID: Mandy Shea MRN: [id number]; DOB: Apr 09, 1965  Admit date: 10/26/2022 Date of Consult: 10/27/2022  PCP:  Sallyanne Kuster, NP   [facility] Providers Cardiologist:  None      New consult completed by Dr Mariah Milling  Patient Profile:   Mandy Shea is a 57 y.o. female with a hx of primary hypertension, type 2 diabetes, asthma, obstructive sleep apnea, who is being seen 10/27/2022 for the evaluation of chest pain at the request of Dr. Arville Care.  History of Present Illness:   Mandy Shea presented to the [facility] emergency department via EMS on 10/26/2022 with complaints of substernal chest discomfort she stated started an hour prior to arrival.  She describes the pain as a sharp stabbing type feeling.  Stated that it did not radiate and denied any other associated symptoms.  She also denied any previous cardiac history.  When EMS arrived patient was complaining of 10 /10 chest pain and was given 324 mg of aspirin and route and pain decreased to 2/10 on the pain scale upon arrival to the ER.  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  She is able to pinpoint the area of discomfort and stated that it felt better with her applying pressure to it.  Initial vital signs: Blood pressure 131/75, pulse of 80, respirations of 13, oxygen saturation of 100%  Pertinent labs: Potassium 3.4, blood glucose 163, BUN of 23, WBCs of 3.9, high-sensitivity troponin trended 22, 23, 24, 23  Imaging: Chest x-ray showed no acute cardiopulmonary disease.  Medications administered in the emergency department: Sublingual nitro 0.4 mg  On rounds this morning patient was chest pain-free.  Past Medical History:  Diagnosis Date   Anemia    AS A CHILD   Diabetes mellitus without complication (HCC)    Fibroids    Hemorrhoids    Seasonal allergies     Sleep apnea    NO CPAP    Past Surgical History:  Procedure Laterality Date   CHOLECYSTECTOMY  09-26-13   COLONOSCOPY WITH PROPOFOL N/A 01/05/2021   Procedure: COLONOSCOPY WITH PROPOFOL;  Surgeon: Wyline Mood, MD;  Location: [facility] ENDOSCOPY;  Service: Gastroenterology;  Laterality: N/A;   CYSTOSCOPY N/A 01/12/2017   Procedure: CYSTOSCOPY;  Surgeon: Vena Austria, MD;  Location: [facility] ORS;  Service: Gynecology;  Laterality: N/A;   HYSTEROSCOPY WITH D & C  11/24/2016   Procedure: DILATATION AND CURETTAGE /HYSTEROSCOPY;  Surgeon: Vena Austria, MD;  Location: [facility] ORS;  Service: Gynecology;;   KNEE SURGERY Right    LEEP  2010   [facility]   TONSILLECTOMY  2014   TOTAL LAPAROSCOPIC HYSTERECTOMY WITH SALPINGECTOMY Right 01/12/2017   Procedure: TOTAL LAPAROSCOPIC HYSTERECTOMY WITH RIGHT SALPINGECTOMY;  Surgeon: Vena Austria, MD;  Location: [facility] ORS;  Service: Gynecology;  Laterality: Right;   TUBAL LIGATION  1997     Home Medications:  Prior to Admission medications   Medication Sig Start Date End Date Taking? Authorizing Provider  budesonide-formoterol (SYMBICORT) 160-4.5 MCG/ACT inhaler Inhale 2 puffs into the lungs 2 (two) times daily. 07/07/22  Yes Abernathy, Alyssa, NP  Dupilumab (DUPIXENT [place]) Inject 300 mg into the skin.   Yes [provider]  loratadine (CLARITIN) 10 MG tablet Take 1 tablet (10 mg total) by mouth daily. 10/21/22  Yes Abernathy, Alyssa, NP  montelukast (SINGULAIR) 10 MG tablet Take 1 tablet (10  mg total) by mouth daily. 10/21/22  Yes Abernathy, Arlyss Repress, NP  venlafaxine XR (EFFEXOR-XR) 75 MG 24 hr capsule Take 1 capsule (75 mg total) by mouth daily with breakfast. 10/21/22  Yes Abernathy, Alyssa, NP  albuterol (VENTOLIN HFA) 108 (90 Base) MCG/ACT inhaler Inhale 2 puffs into the lungs every 6 (six) hours as needed for wheezing or shortness of breath. 10/18/21   Sallyanne Kuster, NP  glucose blood (ONETOUCH VERIO) test strip Use 1 test strip to check glucose once  daily and as needed 10/21/22   Sallyanne Kuster, NP  Lancets (ONETOUCH DELICA PLUS LANCET30G) MISC Use 1 lancet to check glucose once daily and as needed 10/21/22   Sallyanne Kuster, NP  metFORMIN (GLUCOPHAGE-XR) 500 MG 24 hr tablet TAKE 1 TABLET BY MOUTH EVERY DAY FOR DIABETES 10/21/22   Sallyanne Kuster, NP  Semaglutide, 2 MG/DOSE, 8 MG/3ML SOPN Inject 2 mg as directed once a week. 07/07/22   Sallyanne Kuster, NP    Inpatient Medications: Scheduled Meds:  enoxaparin (LOVENOX) injection  40 mg Subcutaneous Q24H   loratadine  10 mg Oral Daily   montelukast  10 mg Oral Daily   venlafaxine XR  75 mg Oral Q breakfast   Continuous Infusions:  sodium chloride 100 mL/hr at 10/27/22 0544   PRN Meds: acetaminophen **OR** acetaminophen, albuterol, magnesium hydroxide, morphine injection, nitroGLYCERIN, ondansetron **OR** ondansetron (ZOFRAN) IV, traZODone  Allergies:    Allergies  Allergen Reactions   Augmentin [Amoxicillin-Pot Clavulanate] Itching    Social History:   Social History   Socioeconomic History   Marital status: Married    Spouse name: Not on file   Number of children: Not on file   Years of education: Not on file   Highest education level: Not on file  Occupational History   Not on file  Tobacco Use   Smoking status: Never   Smokeless tobacco: Never  Vaping Use   Vaping status: Never Used  Substance and Sexual Activity   Alcohol use: No   Drug use: No   Sexual activity: Yes    Birth control/protection: None  Other Topics Concern   Not on file  Social History Narrative   Not on file   Social Determinants of Health   Financial Resource Strain: Not on file  Food Insecurity: Not on file  Transportation Needs: Not on file  Physical Activity: Not on file  Stress: Not on file  Social Connections: Not on file  Intimate Partner Violence: Not on file    Family History:    Family History  Problem Relation Age of Onset   Diabetes Sister    Breast cancer Neg  Hx      ROS:  Please see the history of present illness.  Review of Systems  Constitutional:  Positive for malaise/fatigue.  Cardiovascular:  Positive for chest pain.  Musculoskeletal:  Positive for back pain.    All other ROS reviewed and negative.     Physical Exam/Data:   Vitals:   10/26/22 2300 10/26/22 2330 10/27/22 0018 10/27/22 0402  BP: (!) 141/91 126/88 (!) 142/82 113/69  Pulse: 69 63 (!) 57 62  Resp: 11 14    Temp:  98 F (36.7 C) 97.8 F (36.6 C) (!) 97.5 F (36.4 C)  TempSrc:  Oral Oral Oral  SpO2: 100% 100% 100% 100%  Weight:   73.5 kg   Height:   5\' 4"  (1.626 m)     Intake/Output Summary (Last 24 hours) at 10/27/2022 0102 Last data filed at  10/27/2022 0555 Gross per 24 hour  Intake 747.46 ml  Output --  Net 747.46 ml      10/27/2022   12:18 AM 10/26/2022    8:24 PM 10/21/2022   11:04 AM  Last 3 Weights  Weight (lbs) 162 lb 0.6 oz 155 lb 155 lb 12.8 oz  Weight (kg) 73.5 kg 70.308 kg 70.67 kg     Body mass index is 27.81 kg/m.  General:  Well nourished, well developed, in no acute distress HEENT: normal Neck: no JVD Vascular: No carotid bruits; Distal pulses 2+ bilaterally Cardiac:  normal S1, S2; RRR; no murmur  Lungs:  clear to auscultation bilaterally, no wheezing, rhonchi or rales  Abd: soft, nontender, no hepatomegaly  Ext: no edema Musculoskeletal:  No deformities, BUE and BLE strength normal and equal Skin: warm and dry  Neuro:  CNs 2-12 intact, no focal abnormalities noted Psych:  Normal affect   EKG:  The EKG was personally reviewed and demonstrates: Sinus rhythm rate of 71 with nonspecific T waves Telemetry:  Telemetry was personally reviewed and demonstrates: Sinus rhythm rate in the 60s  Relevant CV Studies:  Echocardiogram ordered and pending  Laboratory Data:  High Sensitivity Troponin:   Recent Labs  Lab 10/26/22 2027 10/26/22 2218 10/27/22 0154 10/27/22 0437  TROPONINIHS 22* 23* 24* 23*     Chemistry Recent Labs   Lab 10/26/22 2027 10/27/22 0154 10/27/22 0437  NA 138 140  --   K 3.4* 3.6  --   CL 104 104  --   CO2 26 28  --   GLUCOSE 163* 153*  --   BUN 23* 21*  --   CREATININE 0.72 0.76  --   CALCIUM 9.0 8.8*  --   MG  --   --  2.2  GFRNONAA >60 >60  --   ANIONGAP 8 8  --     Recent Labs  Lab 10/26/22 2027  PROT 7.3  ALBUMIN 4.3  AST 30  ALT 26  ALKPHOS 72  BILITOT 0.5   Lipids No results for input(s): "CHOL", "TRIG", "HDL", "LABVLDL", "LDLCALC", "CHOLHDL" in the last 168 hours.  Hematology Recent Labs  Lab 10/26/22 2027 10/27/22 0154  WBC 3.9* 4.2  RBC 4.11 3.95  HGB 12.4 11.9*  HCT 35.9* 35.0*  MCV 87.3 88.6  MCH 30.2 30.1  MCHC 34.5 34.0  RDW 11.8 11.6  PLT 305 289   Thyroid No results for input(s): "TSH", "FREET4" in the last 168 hours.  BNPNo results for input(s): "BNP", "PROBNP" in the last 168 hours.  DDimer No results for input(s): "DDIMER" in the last 168 hours.   Radiology/Studies:  DG Chest 2 View  Result Date: 10/26/2022 CLINICAL DATA:  Chest pain EXAM: CHEST - 2 VIEW COMPARISON:  01/18/2022 FINDINGS: The heart size and mediastinal contours are within normal limits. Both lungs are clear. The visualized skeletal structures are unremarkable. IMPRESSION: No active cardiopulmonary disease. Electronically Signed   By: Alcide Clever M.D.   On: 10/26/2022 21:07     Assessment and Plan:   Atypical chest pain -presented with chest pain that was relieved with nitroglycerin 0.4 mg sublingual -remains chest pain free on exam -high sensitivity troponin peak at 24 remained flat -No ischemic changes noted on EKG -No current indication for heparin infusion -Echocardiogram ordered and pending with further recommendations to follow -Other stable findings on echocardiogram and patient can likely be discharged home today -Recommend continued outpatient workup with coronary CTA -Recommend secondary prevention measures of  starting statin therapy, dietary/lifestyle  changes  Hypertension -blood pressure 121/85 -remains stable -vitals per unit protocol  Mixed hyperlipidemia -LDL 114 (10/2021) -upcoming outpt panel ordered -Recommend starting statin therapy, she is diabetic, LDL goal less than 100  Type 2 diabetes -A1c 7.4 -Continued management per IM  Hypokalemia -Serum potassium on arrival 3.4 -Supplementation given -Serum potassium this morning 3.6 -Daily BMP   Risk Assessment/Risk Scores:     TIMI Risk Score for Unstable Angina or Non-ST Elevation MI:   The patient's TIMI risk score is  , which indicates a  % risk of all cause mortality, new or recurrent myocardial infarction or need for urgent revascularization in the next 14 days.          For questions or updates, please contact [facility] Please consult [email or web address] for contact info under    Signed, Priyal Musquiz, NP  10/27/2022 7:44 AM

## 2022-10-27 NOTE — Assessment & Plan Note (Signed)
-   The patient will be placed on supplemental coverage with NovoLog. - We will hold off metformin. 

## 2022-11-03 ENCOUNTER — Telehealth: Payer: Self-pay | Admitting: Cardiovascular Disease

## 2022-11-03 DIAGNOSIS — R072 Precordial pain: Secondary | ICD-10-CM

## 2022-11-03 MED ORDER — METOPROLOL TARTRATE 100 MG PO TABS: ORAL_TABLET | ORAL | 0 refills | Status: DC

## 2022-11-03 NOTE — Telephone Encounter (Signed)
Patient states she saw Dr. Mariah Milling in hospital and was told she would need to follow-up with a CT and was told she would be called to schedule. Requesting call back to see when she should have CT.

## 2022-11-03 NOTE — Telephone Encounter (Signed)
Patient given instruction for CCTA and verbalized understanding. Patient advised that she will receive a call from the CT scheduling department. Verified patients pharmacy. Patient scheduled for follow up appointment on 9/27.   CCTA instructions sent via mychart.

## 2022-11-08 ENCOUNTER — Encounter (HOSPITAL_COMMUNITY): Payer: Self-pay

## 2022-11-10 ENCOUNTER — Ambulatory Visit
Admission: RE | Admit: 2022-11-10 | Discharge: 2022-11-10 | Disposition: A | Discharge status: Home / Self Care | Payer: BC Managed Care – PPO | Source: Ambulatory Visit | Attending: Cardiology | Admitting: Cardiology

## 2022-11-10 DIAGNOSIS — R072 Precordial pain: Secondary | ICD-10-CM | POA: Diagnosis present

## 2022-11-10 MED ORDER — METOPROLOL TARTRATE 5 MG/5ML IV SOLN
10.0000 mg | Freq: Once | INTRAVENOUS | Status: AC | PRN
  Administered 2022-11-10: 10 mg via INTRAVENOUS

## 2022-11-10 MED ORDER — NITROGLYCERIN 0.4 MG SL SUBL
0.8000 mg | SUBLINGUAL_TABLET | Freq: Once | SUBLINGUAL | Status: AC
  Administered 2022-11-10: 0.8 mg via SUBLINGUAL

## 2022-11-10 MED ORDER — IOHEXOL 350 MG/ML SOLN
75.0000 mL | Freq: Once | INTRAVENOUS | Status: AC | PRN
  Administered 2022-11-10: 75 mL via INTRAVENOUS

## 2022-11-10 MED ORDER — SODIUM CHLORIDE 0.9 % IV SOLN: INTRAVENOUS | Status: DC

## 2022-11-10 MED ORDER — DILTIAZEM HCL 25 MG/5ML IV SOLN: 5.0000 mg | Freq: Once | INTRAVENOUS | Status: DC

## 2022-11-10 MED ORDER — DILTIAZEM HCL 25 MG/5ML IV SOLN: 10.0000 mg | INTRAVENOUS | Status: DC | PRN

## 2022-11-10 NOTE — Progress Notes (Signed)
A coronary calcium score of 23.6.  Minimal nonobstructive disease in 0-24%.  Continue to work on lifestyle modification and risk factor modification.

## 2022-11-10 NOTE — Progress Notes (Signed)
Patient tolerated procedure well. Ambulate w/o difficulty. Denies light headedness or being dizzy. Sitting in chair drinking water provided. Encouraged to drink extra water today and reasoning explained. Verbalized understanding. All questions answered. ABC intact. No further needs. Discharge from procedure area w/o issues.   °

## 2022-11-14 ENCOUNTER — Telehealth: Payer: Self-pay

## 2022-11-21 ENCOUNTER — Telehealth: Payer: Self-pay

## 2022-11-22 ENCOUNTER — Ambulatory Visit
Admission: RE | Admit: 2022-11-22 | Discharge: 2022-11-22 | Disposition: A | Discharge status: Home / Self Care | Payer: BC Managed Care – PPO | Source: Ambulatory Visit | Attending: Internal Medicine | Admitting: Internal Medicine

## 2022-11-22 DIAGNOSIS — Z1231 Encounter for screening mammogram for malignant neoplasm of breast: Secondary | ICD-10-CM | POA: Diagnosis present

## 2022-11-22 MED ORDER — TIRZEPATIDE 5 MG/0.5ML ~~LOC~~ SOAJ: 5.0000 mg | SUBCUTANEOUS | 3 refills | Status: DC

## 2022-11-22 NOTE — Telephone Encounter (Signed)
Pt advised we sent med  

## 2022-11-23 ENCOUNTER — Encounter: Payer: Self-pay | Admitting: Nurse Practitioner

## 2022-11-23 ENCOUNTER — Ambulatory Visit (INDEPENDENT_AMBULATORY_CARE_PROVIDER_SITE_OTHER): Payer: BC Managed Care – PPO | Admitting: Nurse Practitioner

## 2022-11-23 VITALS — BP 124/88 | HR 107 | Temp 98.6°F | Resp 16 | Ht 64.0 in | Wt 154.6 lb

## 2022-11-23 DIAGNOSIS — B379 Candidiasis, unspecified: Secondary | ICD-10-CM

## 2022-11-23 DIAGNOSIS — R319 Hematuria, unspecified: Secondary | ICD-10-CM | POA: Diagnosis not present

## 2022-11-23 DIAGNOSIS — N3001 Acute cystitis with hematuria: Secondary | ICD-10-CM

## 2022-11-23 DIAGNOSIS — N390 Urinary tract infection, site not specified: Secondary | ICD-10-CM

## 2022-11-23 LAB — POCT URINALYSIS DIPSTICK
Bilirubin, UA: NEGATIVE
Glucose, UA: NEGATIVE
Leukocytes, UA: NEGATIVE
Nitrite, UA: NEGATIVE
Protein, UA: NEGATIVE
Spec Grav, UA: 1.015 (ref 1.010–1.025)
Urobilinogen, UA: 0.2 E.U./dL
pH, UA: 5 (ref 5.0–8.0)

## 2022-11-23 MED ORDER — SULFAMETHOXAZOLE-TRIMETHOPRIM 800-160 MG PO TABS
1.0000 | ORAL_TABLET | Freq: Two times a day (BID) | ORAL | 0 refills | Status: AC
Start: 2022-11-23 — End: 2022-11-28

## 2022-11-23 MED ORDER — FLUCONAZOLE 150 MG PO TABS
150.0000 mg | ORAL_TABLET | Freq: Once | ORAL | 0 refills | Status: AC
Start: 2022-11-23 — End: 2022-11-23

## 2022-11-23 NOTE — Progress Notes (Signed)
[facility] [address] [place], [place] [address]  Internal MEDICINE  Office Visit Note  Patient Name: Mandy Shea  [id number]  [id number]  Date of Service: 11/23/2022  Chief Complaint  Patient presents with   Acute Visit    uti     HPI Maisen presents for an acute sick visit for symptoms of UTI --onset was about a month ago, symptoms worsening. Urinalysis positive for moderate blood.  Reports back pain, urgency but no frequency. Reports urine odor.      Current Medication:  Outpatient Encounter Medications as of 11/23/2022  Medication Sig Note   albuterol (VENTOLIN HFA) 108 (90 Base) MCG/ACT inhaler Inhale 2 puffs into the lungs every 6 (six) hours as needed for wheezing or shortness of breath.    budesonide-formoterol (SYMBICORT) 160-4.5 MCG/ACT inhaler Inhale 2 puffs into the lungs 2 (two) times daily.    Dupilumab (DUPIXENT [place]) Inject 300 mg into the skin. 10/27/2022: Every other week   [EXPIRED] fluconazole (DIFLUCAN) 150 MG tablet Take 1 tablet (150 mg total) by mouth once for 1 dose. May take an additional dose after 3 days if still symptomatic.    glucose blood (ONETOUCH VERIO) test strip Use 1 test strip to check glucose once daily and as needed    Lancets (ONETOUCH DELICA PLUS LANCET30G) MISC Use 1 lancet to check glucose once daily and as needed    loratadine (CLARITIN) 10 MG tablet Take 1 tablet (10 mg total) by mouth daily.    metFORMIN (GLUCOPHAGE-XR) 500 MG 24 hr tablet TAKE 1 TABLET BY MOUTH EVERY DAY FOR DIABETES    montelukast (SINGULAIR) 10 MG tablet Take 1 tablet (10 mg total) by mouth daily.    [EXPIRED] sulfamethoxazole-trimethoprim (BACTRIM DS) 800-160 MG tablet Take 1 tablet by mouth 2 (two) times daily for 5 days.    tirzepatide [facility]) 5 MG/0.5ML Pen Inject 5 mg into the skin once a week.    venlafaxine XR (EFFEXOR-XR) 75 MG 24 hr capsule Take 1 capsule (75 mg total) by mouth daily with breakfast.    [DISCONTINUED] metoprolol  tartrate (LOPRESSOR) 100 MG tablet TAKE 1 TABLET 2 HR PRIOR TO CARDIAC PROCEDURE    No facility-administered encounter medications on file as of 11/23/2022.      Medical History: Past Medical History:  Diagnosis Date   Anemia    AS A CHILD   Diabetes mellitus without complication (HCC)    Fibroids    Hemorrhoids    Seasonal allergies    Sleep apnea    NO CPAP     Vital Signs: BP 124/88   Pulse (!) 107   Temp 98.6 F (37 C)   Resp 16   Ht 5\' 4"  (1.626 m)   Wt 154 lb 9.6 oz (70.1 kg)   LMP 12/28/2016   SpO2 98%   BMI 26.54 kg/m    Review of Systems  Constitutional:  Positive for fatigue.  Genitourinary:  Positive for dysuria, frequency, hematuria, pelvic pain and urgency.  Musculoskeletal:  Positive for back pain.    Physical Exam Vitals reviewed.  Constitutional:      General: She is not in acute distress.    Appearance: Normal appearance. She is not ill-appearing.  HENT:     Head: Normocephalic and atraumatic.  Eyes:     Pupils: Pupils are equal, round, and reactive to light.  Cardiovascular:     Rate and Rhythm: Normal rate and regular rhythm.     Heart sounds: Normal heart sounds.  No murmur heard. Pulmonary:     Effort: Pulmonary effort is normal. No accessory muscle usage or respiratory distress.  Abdominal:     Tenderness: There is abdominal tenderness in the suprapubic area.  Neurological:     Mental Status: She is alert and oriented to person, place, and time.  Psychiatric:        Mood and Affect: Mood normal.        Behavior: Behavior normal.       Assessment/Plan: 1. Acute cystitis with hematuria Urine sent for culture. Bactrim prescribed to treat UTI. Take until gone - POCT urinalysis dipstick - CULTURE, URINE COMPREHENSIVE - sulfamethoxazole-trimethoprim (BACTRIM DS) 800-160 MG tablet; Take 1 tablet by mouth 2 (two) times daily for 5 days.  Dispense: 10 tablet; Refill: 0  2. Antibiotic-induced yeast infection Fluconazole prescribed,  just in case it is needed.  - fluconazole (DIFLUCAN) 150 MG tablet; Take 1 tablet (150 mg total) by mouth once for 1 dose. May take an additional dose after 3 days if still symptomatic.  Dispense: 3 tablet; Refill: 0   General Counseling: Zoie verbalizes understanding of the findings of todays visit and agrees with plan of treatment. I have discussed any further diagnostic evaluation that may be needed or ordered today. We also reviewed her medications today. she has been encouraged to call the office with any questions or concerns that should arise related to todays visit.    Counseling:    Orders Placed This Encounter  Procedures   CULTURE, URINE COMPREHENSIVE   POCT urinalysis dipstick    Meds ordered this encounter  Medications   sulfamethoxazole-trimethoprim (BACTRIM DS) 800-160 MG tablet    Sig: Take 1 tablet by mouth 2 (two) times daily for 5 days.    Dispense:  10 tablet    Refill:  0   fluconazole (DIFLUCAN) 150 MG tablet    Sig: Take 1 tablet (150 mg total) by mouth once for 1 dose. May take an additional dose after 3 days if still symptomatic.    Dispense:  3 tablet    Refill:  0    Return if symptoms worsen or fail to improve.  [place] Controlled Substance Database was reviewed by me for overdose risk score (ORS)  Time spent:20 Minutes Time spent with patient included reviewing progress notes, labs, imaging studies, and discussing plan for follow up.   This patient was seen by Sallyanne Kuster, FNP-C in collaboration with Dr. Beverely Risen as a part of collaborative care agreement.  Temple Ewart R. Tedd Sias, MSN, FNP-C Internal Medicine

## 2022-11-24 NOTE — Telephone Encounter (Signed)
Done

## 2022-11-25 ENCOUNTER — Ambulatory Visit: Payer: BC Managed Care – PPO | Attending: Cardiology | Admitting: Cardiology

## 2022-11-25 ENCOUNTER — Encounter: Payer: Self-pay | Admitting: Cardiology

## 2022-11-25 VITALS — BP 118/86 | HR 76 | Ht 64.0 in | Wt 154.0 lb

## 2022-11-25 DIAGNOSIS — I2510 Atherosclerotic heart disease of native coronary artery without angina pectoris: Secondary | ICD-10-CM | POA: Diagnosis not present

## 2022-11-25 DIAGNOSIS — R072 Precordial pain: Secondary | ICD-10-CM

## 2022-11-25 DIAGNOSIS — E119 Type 2 diabetes mellitus without complications: Secondary | ICD-10-CM

## 2022-11-25 DIAGNOSIS — E782 Mixed hyperlipidemia: Secondary | ICD-10-CM

## 2022-11-25 DIAGNOSIS — I10 Essential (primary) hypertension: Secondary | ICD-10-CM | POA: Diagnosis not present

## 2022-11-25 MED ORDER — ASPIRIN 81 MG PO TBEC: 81.0000 mg | DELAYED_RELEASE_TABLET | Freq: Every day | ORAL | Status: AC

## 2022-11-25 NOTE — Progress Notes (Signed)
CT of the chest with no acute of significant findings.

## 2022-11-25 NOTE — Patient Instructions (Signed)
Medication Instructions:  Your physician recommends the following medication changes.  START TAKING: Asprin 81 mg by mouth daily  *If you need a refill on your cardiac medications before your next appointment, please call your pharmacy*   Lab Work: No labs ordered today    Testing/Procedures: No test ordered today    Follow-Up: At [facility], you and your health needs are our priority.  As part of our continuing mission to provide you with exceptional heart care, we have created designated Provider Care Teams.  These Care Teams include your primary Cardiologist (physician) and Advanced Practice Providers (APPs -  Physician Assistants and Nurse Practitioners) who all work together to provide you with the care you need, when you need it.  We recommend signing up for the patient portal called "MyChart".  Sign up information is provided on this After Visit Summary.  MyChart is used to connect with patients for Virtual Visits (Telemedicine).  Patients are able to view lab/test results, encounter notes, upcoming appointments, etc.  Non-urgent messages can be sent to your provider as well.   To learn more about what you can do with MyChart, go to [email or web address].    Your next appointment:   5-6 month(s)  Provider:   Charlsie Quest, NP

## 2022-11-26 LAB — CMP14+EGFR
ALT: 21 [IU]/L (ref 0–32)
AST: 27 [IU]/L (ref 0–40)
Albumin: 4.6 g/dL (ref 3.8–4.9)
Alkaline Phosphatase: 100 [IU]/L (ref 44–121)
BUN/Creatinine Ratio: 16 (ref 9–23)
BUN: 16 mg/dL (ref 6–24)
Bilirubin Total: 0.3 mg/dL (ref 0.0–1.2)
CO2: 23 mmol/L (ref 20–29)
Calcium: 9.9 mg/dL (ref 8.7–10.2)
Chloride: 101 mmol/L (ref 96–106)
Creatinine, Ser: 0.99 mg/dL (ref 0.57–1.00)
Globulin, Total: 2.7 g/dL (ref 1.5–4.5)
Glucose: 121 mg/dL — ABNORMAL HIGH (ref 70–99)
Potassium: 4.6 mmol/L (ref 3.5–5.2)
Sodium: 139 mmol/L (ref 134–144)
Total Protein: 7.3 g/dL (ref 6.0–8.5)
eGFR: 67 mL/min/{1.73_m2} (ref >59)

## 2022-11-26 LAB — IRON,TIBC AND FERRITIN PANEL
Ferritin: 128 ng/mL (ref 15–150)
Iron Saturation: 20 % (ref 15–55)
Iron: 60 ug/dL (ref 27–159)
Total Iron Binding Capacity: 296 ug/dL (ref 250–450)
UIBC: 236 ug/dL (ref 131–425)

## 2022-11-26 LAB — CBC WITH DIFFERENTIAL/PLATELET
Basophils Absolute: 0 10*3/uL (ref 0.0–0.2)
Basos: 0 %
EOS (ABSOLUTE): 0.1 10*3/uL (ref 0.0–0.4)
Eos: 3 %
Hematocrit: 41.6 % (ref 34.0–46.6)
Hemoglobin: 13.6 g/dL (ref 11.1–15.9)
Immature Grans (Abs): 0 10*3/uL (ref 0.0–0.1)
Immature Granulocytes: 0 %
Lymphocytes Absolute: 1 10*3/uL (ref 0.7–3.1)
Lymphs: 24 %
MCH: 30 pg (ref 26.6–33.0)
MCHC: 32.7 g/dL (ref 31.5–35.7)
MCV: 92 fL (ref 79–97)
Monocytes Absolute: 0.4 10*3/uL (ref 0.1–0.9)
Monocytes: 9 %
Neutrophils Absolute: 2.6 10*3/uL (ref 1.4–7.0)
Neutrophils: 64 %
Platelets: 292 10*3/uL (ref 150–450)
RBC: 4.53 x10E6/uL (ref 3.77–5.28)
RDW: 11.4 % — ABNORMAL LOW (ref 11.7–15.4)
WBC: 4.1 10*3/uL (ref 3.4–10.8)

## 2022-11-26 LAB — VITAMIN D 25 HYDROXY (VIT D DEFICIENCY, FRACTURES): Vit D, 25-Hydroxy: 43.3 ng/mL (ref 30.0–100.0)

## 2022-11-26 LAB — TSH+FREE T4
Free T4: 0.9 ng/dL (ref 0.82–1.77)
TSH: 1.75 u[IU]/mL (ref 0.450–4.500)

## 2022-11-26 LAB — LIPID PANEL
Chol/HDL Ratio: 3.5 {ratio} (ref 0.0–4.4)
Cholesterol, Total: 183 mg/dL (ref 100–199)
HDL: 52 mg/dL (ref >39)
LDL Chol Calc (NIH): 114 mg/dL — ABNORMAL HIGH (ref 0–99)
Triglycerides: 93 mg/dL (ref 0–149)
VLDL Cholesterol Cal: 17 mg/dL (ref 5–40)

## 2022-11-26 LAB — B12 AND FOLATE PANEL
Folate: >20 ng/mL (ref >3.0)
Vitamin B-12: 1252 pg/mL — ABNORMAL HIGH (ref 232–1245)

## 2022-11-26 LAB — HGB A1C W/O EAG: Hgb A1c MFr Bld: 6.1 % — ABNORMAL HIGH (ref 4.8–5.6)

## 2022-11-26 LAB — MAGNESIUM: Magnesium: 1.9 mg/dL (ref 1.6–2.3)

## 2022-11-28 LAB — CULTURE, URINE COMPREHENSIVE

## 2022-12-05 ENCOUNTER — Telehealth: Payer: Self-pay

## 2022-12-05 NOTE — Telephone Encounter (Signed)
Completed a new P.A. for patient's Mounjaro.

## 2022-12-06 ENCOUNTER — Telehealth: Payer: Self-pay

## 2022-12-06 NOTE — Telephone Encounter (Signed)
Completed Mounjaro appeal this morning.

## 2023-01-02 ENCOUNTER — Encounter: Payer: Self-pay | Admitting: Nurse Practitioner

## 2023-01-02 ENCOUNTER — Ambulatory Visit (INDEPENDENT_AMBULATORY_CARE_PROVIDER_SITE_OTHER): Payer: BC Managed Care – PPO | Admitting: Nurse Practitioner

## 2023-01-02 VITALS — BP 120/84 | HR 88 | Temp 98.2°F | Resp 16 | Ht 64.0 in | Wt 157.4 lb

## 2023-01-02 DIAGNOSIS — E1169 Type 2 diabetes mellitus with other specified complication: Secondary | ICD-10-CM | POA: Diagnosis not present

## 2023-01-02 DIAGNOSIS — E785 Hyperlipidemia, unspecified: Secondary | ICD-10-CM

## 2023-01-02 DIAGNOSIS — J4540 Moderate persistent asthma, uncomplicated: Secondary | ICD-10-CM

## 2023-01-02 NOTE — Progress Notes (Unsigned)
[facility] [address] [place], [place] [address]  Internal MEDICINE  Office Visit Note  Patient Name: Mandy Shea  [id number]  [id number]  Date of Service: 01/02/2023  Chief Complaint  Patient presents with   Acute Visit    No diabetic med x 7 weeks. Having issues.     HPI Mandy Shea presents for a follow-up visit for  Diabetes --     Current Medication: Outpatient Encounter Medications as of 01/02/2023  Medication Sig Note   albuterol (VENTOLIN HFA) 108 (90 Base) MCG/ACT inhaler Inhale 2 puffs into the lungs every 6 (six) hours as needed for wheezing or shortness of breath.    aspirin EC 81 MG tablet Take 1 tablet (81 mg total) by mouth daily. Swallow whole.    budesonide-formoterol (SYMBICORT) 160-4.5 MCG/ACT inhaler Inhale 2 puffs into the lungs 2 (two) times daily.    Dupilumab (DUPIXENT [place]) Inject 300 mg into the skin. 10/27/2022: Every other week   glucose blood (ONETOUCH VERIO) test strip Use 1 test strip to check glucose once daily and as needed    Lancets (ONETOUCH DELICA PLUS LANCET30G) MISC Use 1 lancet to check glucose once daily and as needed    loratadine (CLARITIN) 10 MG tablet Take 1 tablet (10 mg total) by mouth daily.    metFORMIN (GLUCOPHAGE-XR) 500 MG 24 hr tablet TAKE 1 TABLET BY MOUTH EVERY DAY FOR DIABETES    montelukast (SINGULAIR) 10 MG tablet Take 1 tablet (10 mg total) by mouth daily.    tirzepatide [facility]) 5 MG/0.5ML Pen Inject 5 mg into the skin once a week.    venlafaxine XR (EFFEXOR-XR) 75 MG 24 hr capsule Take 1 capsule (75 mg total) by mouth daily with breakfast.    No facility-administered encounter medications on file as of 01/02/2023.    Surgical History: Past Surgical History:  Procedure Laterality Date   CHOLECYSTECTOMY  09-26-13   COLONOSCOPY WITH PROPOFOL N/A 01/05/2021   Procedure: COLONOSCOPY WITH PROPOFOL;  Surgeon: Wyline Mood, MD;  Location: [facility] ENDOSCOPY;  Service: Gastroenterology;  Laterality: N/A;    CYSTOSCOPY N/A 01/12/2017   Procedure: CYSTOSCOPY;  Surgeon: Vena Austria, MD;  Location: [facility] ORS;  Service: Gynecology;  Laterality: N/A;   HYSTEROSCOPY WITH D & C  11/24/2016   Procedure: DILATATION AND CURETTAGE /HYSTEROSCOPY;  Surgeon: Vena Austria, MD;  Location: [facility] ORS;  Service: Gynecology;;   KNEE SURGERY Right    LEEP  2010   [facility]   TONSILLECTOMY  2014   TOTAL LAPAROSCOPIC HYSTERECTOMY WITH SALPINGECTOMY Right 01/12/2017   Procedure: TOTAL LAPAROSCOPIC HYSTERECTOMY WITH RIGHT SALPINGECTOMY;  Surgeon: Vena Austria, MD;  Location: [facility] ORS;  Service: Gynecology;  Laterality: Right;   TUBAL LIGATION  1997    Medical History: Past Medical History:  Diagnosis Date   Anemia    AS A CHILD   Diabetes mellitus without complication (HCC)    Fibroids    Hemorrhoids    Seasonal allergies    Sleep apnea    NO CPAP    Family History: Family History  Problem Relation Age of Onset   Diabetes Sister    Breast cancer Neg Hx     Social History   Socioeconomic History   Marital status: Married    Spouse name: Not on file   Number of children: Not on file   Years of education: Not on file   Highest education level: Not on file  Occupational History   Not on file  Tobacco Use  Smoking status: Never   Smokeless tobacco: Never  Vaping Use   Vaping status: Never Used  Substance and Sexual Activity   Alcohol use: No   Drug use: No   Sexual activity: Yes    Birth control/protection: None  Other Topics Concern   Not on file  Social History Narrative   Not on file   Social Determinants of Health   Financial Resource Strain: Not on file  Food Insecurity: Not on file  Transportation Needs: Not on file  Physical Activity: Not on file  Stress: Not on file  Social Connections: Not on file  Intimate Partner Violence: Not on file      Review of Systems  Vital Signs: BP 120/84   Pulse 88   Temp 98.2 F (36.8 C)   Resp 16   Ht 5\' 4"  (1.626 m)    Wt 157 lb 6.4 oz (71.4 kg)   LMP 12/28/2016   SpO2 99%   BMI 27.02 kg/m    Physical Exam     Assessment/Plan:   General Counseling: Mandy Shea verbalizes understanding of the findings of todays visit and agrees with plan of treatment. I have discussed any further diagnostic evaluation that may be needed or ordered today. We also reviewed her medications today. she has been encouraged to call the office with any questions or concerns that should arise related to todays visit.    No orders of the defined types were placed in this encounter.   No orders of the defined types were placed in this encounter.   No follow-ups on file.   Total time spent:*** Minutes Time spent includes review of chart, medications, test results, and follow up plan with the patient.   [place] Controlled Substance Database was reviewed by me.  This patient was seen by Sallyanne Kuster, FNP-C in collaboration with Dr. Beverely Risen as a part of collaborative care agreement.   Shervin Cypert R. Tedd Sias, MSN, FNP-C Internal medicine

## 2023-01-08 ENCOUNTER — Encounter: Payer: Self-pay | Admitting: Nurse Practitioner

## 2023-01-11 ENCOUNTER — Telehealth: Payer: Self-pay

## 2023-01-11 MED ORDER — OZEMPIC (2 MG/DOSE) 8 MG/3ML ~~LOC~~ SOPN
2.0000 mg | PEN_INJECTOR | SUBCUTANEOUS | 3 refills | Status: DC
Start: 2023-01-11 — End: 2023-10-23

## 2023-01-11 NOTE — Telephone Encounter (Signed)
Pt advised Ozempic is ready for pickup

## 2023-01-11 NOTE — Telephone Encounter (Signed)
Pt called for ozempic samples we will call her when we received more

## 2023-01-12 ENCOUNTER — Telehealth: Payer: Self-pay

## 2023-01-12 NOTE — Telephone Encounter (Signed)
Completed P.A. for patient's Ozempic. 

## 2023-02-01 ENCOUNTER — Encounter: Payer: Self-pay | Admitting: Internal Medicine

## 2023-02-01 ENCOUNTER — Ambulatory Visit (INDEPENDENT_AMBULATORY_CARE_PROVIDER_SITE_OTHER): Payer: BC Managed Care – PPO | Admitting: Internal Medicine

## 2023-02-01 VITALS — BP 128/85 | HR 94 | Temp 97.6°F | Resp 16 | Ht 64.0 in | Wt 162.0 lb

## 2023-02-01 DIAGNOSIS — R232 Flushing: Secondary | ICD-10-CM

## 2023-02-01 DIAGNOSIS — J4520 Mild intermittent asthma, uncomplicated: Secondary | ICD-10-CM | POA: Diagnosis not present

## 2023-02-01 DIAGNOSIS — E1165 Type 2 diabetes mellitus with hyperglycemia: Secondary | ICD-10-CM

## 2023-02-01 NOTE — Progress Notes (Unsigned)
[facility] [address] [place], [place] [address]  Internal MEDICINE  Office Visit Note  Patient Name: Mandy Shea  [id number]  [id number]  Date of Service: 02/02/2023  Chief Complaint  Patient presents with   Follow-up   Diabetes   Quality Metric Gaps    Eye Exam    HPI Pt is seen for follow up Was just started on Ozempic for diabetic management, she is also on metformin but does have GI intolerance. Numbers are improving  C/O hot flashes, had hysterectomy done, does not know if she is menopausal  She is on Dupixent for skin condition, asthma and allergies  Mild CAD    Current Medication: Outpatient Encounter Medications as of 02/01/2023  Medication Sig Note   albuterol (VENTOLIN HFA) 108 (90 Base) MCG/ACT inhaler Inhale 2 puffs into the lungs every 6 (six) hours as needed for wheezing or shortness of breath.    aspirin EC 81 MG tablet Take 1 tablet (81 mg total) by mouth daily. Swallow whole.    budesonide-formoterol (SYMBICORT) 160-4.5 MCG/ACT inhaler Inhale 2 puffs into the lungs 2 (two) times daily.    Dupilumab (DUPIXENT [place]) Inject 300 mg into the skin. 10/27/2022: Every other week   glucose blood (ONETOUCH VERIO) test strip Use 1 test strip to check glucose once daily and as needed    Lancets (ONETOUCH DELICA PLUS LANCET30G) MISC Use 1 lancet to check glucose once daily and as needed    loratadine (CLARITIN) 10 MG tablet Take 1 tablet (10 mg total) by mouth daily.    montelukast (SINGULAIR) 10 MG tablet Take 1 tablet (10 mg total) by mouth daily.    Semaglutide, 2 MG/DOSE, (OZEMPIC, 2 MG/DOSE,) 8 MG/3ML SOPN Inject 2 mg into the skin once a week. FOR TYPE 2 DIABETES E11.69, E11.65    venlafaxine XR (EFFEXOR-XR) 75 MG 24 hr capsule Take 1 capsule (75 mg total) by mouth daily with breakfast.    [DISCONTINUED] metFORMIN (GLUCOPHAGE-XR) 500 MG 24 hr tablet TAKE 1 TABLET BY MOUTH EVERY DAY FOR DIABETES    No facility-administered encounter medications on  file as of 02/01/2023.    Surgical History: Past Surgical History:  Procedure Laterality Date   CHOLECYSTECTOMY  09-26-13   COLONOSCOPY WITH PROPOFOL N/A 01/05/2021   Procedure: COLONOSCOPY WITH PROPOFOL;  Surgeon: Wyline Mood, MD;  Location: [facility] ENDOSCOPY;  Service: Gastroenterology;  Laterality: N/A;   CYSTOSCOPY N/A 01/12/2017   Procedure: CYSTOSCOPY;  Surgeon: Vena Austria, MD;  Location: [facility] ORS;  Service: Gynecology;  Laterality: N/A;   HYSTEROSCOPY WITH D & C  11/24/2016   Procedure: DILATATION AND CURETTAGE /HYSTEROSCOPY;  Surgeon: Vena Austria, MD;  Location: [facility] ORS;  Service: Gynecology;;   KNEE SURGERY Right    LEEP  2010   [facility]   TONSILLECTOMY  2014   TOTAL LAPAROSCOPIC HYSTERECTOMY WITH SALPINGECTOMY Right 01/12/2017   Procedure: TOTAL LAPAROSCOPIC HYSTERECTOMY WITH RIGHT SALPINGECTOMY;  Surgeon: Vena Austria, MD;  Location: [facility] ORS;  Service: Gynecology;  Laterality: Right;   TUBAL LIGATION  1997    Medical History: Past Medical History:  Diagnosis Date   Anemia    AS A CHILD   Diabetes mellitus without complication (HCC)    Fibroids    Hemorrhoids    Seasonal allergies    Sleep apnea    NO CPAP    Family History: Family History  Problem Relation Age of Onset   Diabetes Sister    Breast cancer Neg Hx     Social  History   Socioeconomic History   Marital status: Married    Spouse name: Not on file   Number of children: Not on file   Years of education: Not on file   Highest education level: Not on file  Occupational History   Not on file  Tobacco Use   Smoking status: Never   Smokeless tobacco: Never  Vaping Use   Vaping status: Never Used  Substance and Sexual Activity   Alcohol use: No   Drug use: No   Sexual activity: Yes    Birth control/protection: None  Other Topics Concern   Not on file  Social History Narrative   Not on file   Social Determinants of Health   Financial Resource Strain: Not on file  Food  Insecurity: Not on file  Transportation Needs: Not on file  Physical Activity: Not on file  Stress: Not on file  Social Connections: Not on file  Intimate Partner Violence: Not on file      Review of Systems  Constitutional:  Negative for fatigue and fever.  HENT:  Negative for congestion, mouth sores and postnasal drip.   Respiratory:  Negative for cough.   Cardiovascular:  Negative for chest pain.  Endocrine: Positive for heat intolerance.       HOT FLASHES   Genitourinary:  Negative for flank pain.  Psychiatric/Behavioral: Negative.      Vital Signs: BP 128/85   Pulse 94   Temp 97.6 F (36.4 C)   Resp 16   Ht 5\' 4"  (1.626 m)   Wt 162 lb (73.5 kg)   LMP 12/28/2016   SpO2 98%   BMI 27.81 kg/m    Physical Exam Constitutional:      Appearance: Normal appearance.  HENT:     Head: Normocephalic and atraumatic.     Nose: Nose normal.     Mouth/Throat:     Mouth: Mucous membranes are moist.     Pharynx: No posterior oropharyngeal erythema.  Eyes:     Extraocular Movements: Extraocular movements intact.     Pupils: Pupils are equal, round, and reactive to light.  Cardiovascular:     Pulses: Normal pulses.     Heart sounds: Normal heart sounds.  Pulmonary:     Effort: Pulmonary effort is normal.     Breath sounds: Normal breath sounds.  Neurological:     General: No focal deficit present.     Mental Status: She is alert.  Psychiatric:        Mood and Affect: Mood normal.        Behavior: Behavior normal.        Assessment/Plan: 1. Vasomotor flushing Will need further diagnostics, discussed HRT if needed  - FSH/LH - Estradiol  2. Type 2 diabetes mellitus with hyperglycemia, without long-term current use of insulin (HCC) She can stop metformin if GI intolerance continues   3. Mild intermittent asthma without complication Controlled. Pt may stop Singulair since is on Dupixent    General Counseling: Mandy Shea verbalizes understanding of the findings of  todays visit and agrees with plan of treatment. I have discussed any further diagnostic evaluation that may be needed or ordered today. We also reviewed her medications today. she has been encouraged to call the office with any questions or concerns that should arise related to todays visit.    Orders Placed This Encounter  Procedures   FSH/LH   Estradiol    No orders of the defined types were placed in this encounter.  Total time spent:35 Minutes Time spent includes review of chart, medications, test results, and follow up plan with the patient.   [place] Controlled Substance Database was reviewed by me.   Dr Lyndon Code Internal medicine

## 2023-02-02 LAB — ESTRADIOL: Estradiol: <5 pg/mL

## 2023-02-02 LAB — FSH/LH
FSH: 55.1 m[IU]/mL
LH: 23.6 m[IU]/mL

## 2023-02-02 NOTE — Progress Notes (Signed)
Pt is menopausal, just FYI, no need to call, will discuss on next visit

## 2023-02-20 ENCOUNTER — Ambulatory Visit: Payer: BC Managed Care – PPO | Admitting: Nurse Practitioner

## 2023-03-03 ENCOUNTER — Encounter: Payer: Self-pay | Admitting: Nurse Practitioner

## 2023-03-03 DIAGNOSIS — E1169 Type 2 diabetes mellitus with other specified complication: Secondary | ICD-10-CM | POA: Insufficient documentation

## 2023-03-14 ENCOUNTER — Encounter: Payer: Self-pay | Admitting: Internal Medicine

## 2023-03-14 ENCOUNTER — Ambulatory Visit (INDEPENDENT_AMBULATORY_CARE_PROVIDER_SITE_OTHER): Payer: 59 | Admitting: Internal Medicine

## 2023-03-14 VITALS — BP 110/88 | HR 80 | Temp 98.2°F | Resp 16 | Ht 64.0 in | Wt 156.0 lb

## 2023-03-14 DIAGNOSIS — L281 Prurigo nodularis: Secondary | ICD-10-CM | POA: Diagnosis not present

## 2023-03-14 DIAGNOSIS — E1165 Type 2 diabetes mellitus with hyperglycemia: Secondary | ICD-10-CM | POA: Diagnosis not present

## 2023-03-14 DIAGNOSIS — B001 Herpesviral vesicular dermatitis: Secondary | ICD-10-CM

## 2023-03-14 DIAGNOSIS — N959 Unspecified menopausal and perimenopausal disorder: Secondary | ICD-10-CM | POA: Diagnosis not present

## 2023-03-14 LAB — POCT GLYCOSYLATED HEMOGLOBIN (HGB A1C): Hemoglobin A1C: 8.8 % — AB (ref 4.0–5.6)

## 2023-03-14 MED ORDER — ACYCLOVIR 800 MG PO TABS
ORAL_TABLET | ORAL | 4 refills | Status: AC
Start: 2023-03-14 — End: ?

## 2023-03-14 MED ORDER — ESTRADIOL 0.075 MG/24HR TD PTWK: MEDICATED_PATCH | TRANSDERMAL | 12 refills | Status: DC

## 2023-03-14 NOTE — Progress Notes (Signed)
 [facility] [address] [place], [place] [address]  Internal MEDICINE  Office Visit Note  Patient Name: Mandy Shea  [id number]  [id number]  Date of Service: 03/29/2023  Chief Complaint  Patient presents with   Follow-up   Diabetes    HPI Pt is seen for routine follow up Feels better, has been on Ozempic   Asthma well controlled. Rash is better with Dupixent  Will like to start on HRT Cold sore// lesion  around her lip     Current Medication: Outpatient Encounter Medications as of 03/14/2023  Medication Sig Note   acyclovir  (ZOVIRAX ) 800 MG tablet One tab po at bedtime for cold sore    albuterol  (VENTOLIN  HFA) 108 (90 Base) MCG/ACT inhaler Inhale 2 puffs into the lungs every 6 (six) hours as needed for wheezing or shortness of breath.    aspirin  EC 81 MG tablet Take 1 tablet (81 mg total) by mouth daily. Swallow whole.    budesonide -formoterol  (SYMBICORT ) 160-4.5 MCG/ACT inhaler Inhale 2 puffs into the lungs 2 (two) times daily.    Dupilumab (DUPIXENT [place]) Inject 300 mg into the skin. 10/27/2022: Every other week   estradiol  (CLIMARA  - DOSED IN MG/24 HR) 0.075 mg/24hr patch Apply one patch to clean skin every 4 th day    glucose blood (ONETOUCH VERIO) test strip Use 1 test strip to check glucose once daily and as needed    Lancets (ONETOUCH DELICA PLUS LANCET30G) MISC Use 1 lancet to check glucose once daily and as needed    loratadine  (CLARITIN ) 10 MG tablet Take 1 tablet (10 mg total) by mouth daily.    montelukast  (SINGULAIR ) 10 MG tablet Take 1 tablet (10 mg total) by mouth daily.    Semaglutide , 2 MG/DOSE, (OZEMPIC , 2 MG/DOSE,) 8 MG/3ML SOPN Inject 2 mg into the skin once a week. FOR TYPE 2 DIABETES E11.69, E11.65    venlafaxine  XR (EFFEXOR -XR) 75 MG 24 hr capsule Take 1 capsule (75 mg total) by mouth daily with breakfast.    No facility-administered encounter medications on file as of 03/14/2023.    Surgical History: Past Surgical History:  Procedure  Laterality Date   CHOLECYSTECTOMY  09-26-13   COLONOSCOPY WITH PROPOFOL  N/A 01/05/2021   Procedure: COLONOSCOPY WITH PROPOFOL ;  Surgeon: Therisa Bi, MD;  Location: [facility] ENDOSCOPY;  Service: Gastroenterology;  Laterality: N/A;   CYSTOSCOPY N/A 01/12/2017   Procedure: CYSTOSCOPY;  Surgeon: Lake Read, MD;  Location: [facility] ORS;  Service: Gynecology;  Laterality: N/A;   HYSTEROSCOPY WITH D & C  11/24/2016   Procedure: DILATATION AND CURETTAGE /HYSTEROSCOPY;  Surgeon: Lake Read, MD;  Location: [facility] ORS;  Service: Gynecology;;   KNEE SURGERY Right    LEEP  2010   [facility]   TONSILLECTOMY  2014   TOTAL LAPAROSCOPIC HYSTERECTOMY WITH SALPINGECTOMY Right 01/12/2017   Procedure: TOTAL LAPAROSCOPIC HYSTERECTOMY WITH RIGHT SALPINGECTOMY;  Surgeon: Lake Read, MD;  Location: [facility] ORS;  Service: Gynecology;  Laterality: Right;   TUBAL LIGATION  1997    Medical History: Past Medical History:  Diagnosis Date   Anemia    AS A CHILD   Diabetes mellitus without complication (HCC)    Fibroids    Hemorrhoids    Seasonal allergies    Sleep apnea    NO CPAP    Family History: Family History  Problem Relation Age of Onset   Diabetes Sister    Breast cancer Neg Hx     Social History   Socioeconomic History   Marital status:  Married    Spouse name: Not on file   Number of children: Not on file   Years of education: Not on file   Highest education level: Not on file  Occupational History   Not on file  Tobacco Use   Smoking status: Never   Smokeless tobacco: Never  Vaping Use   Vaping status: Never Used  Substance and Sexual Activity   Alcohol use: No   Drug use: No   Sexual activity: Yes    Birth control/protection: None  Other Topics Concern   Not on file  Social History Narrative   Not on file   Social Drivers of Health   Financial Resource Strain: Not on file  Food Insecurity: Not on file  Transportation Needs: Not on file  Physical Activity: Not on file   Stress: Not on file  Social Connections: Not on file  Intimate Partner Violence: Not on file      Review of Systems  Constitutional:  Negative for fatigue and fever.  HENT:  Negative for congestion, mouth sores and postnasal drip.   Respiratory:  Negative for cough.   Cardiovascular:  Negative for chest pain.  Genitourinary:  Negative for flank pain.  Psychiatric/Behavioral: Negative.      Vital Signs: BP 110/88   Pulse 80   Temp 98.2 F (36.8 C)   Resp 16   Ht 5' 4 (1.626 m)   Wt 156 lb (70.8 kg)   LMP 12/28/2016   SpO2 97%   BMI 26.78 kg/m    Physical Exam Constitutional:      Appearance: Normal appearance.  HENT:     Head: Normocephalic and atraumatic.     Nose: Nose normal.     Mouth/Throat:     Mouth: Mucous membranes are moist.     Pharynx: No posterior oropharyngeal erythema.  Eyes:     Extraocular Movements: Extraocular movements intact.     Pupils: Pupils are equal, round, and reactive to light.  Cardiovascular:     Pulses: Normal pulses.     Heart sounds: Normal heart sounds.  Pulmonary:     Effort: Pulmonary effort is normal.     Breath sounds: Normal breath sounds.  Neurological:     General: No focal deficit present.     Mental Status: She is alert.  Psychiatric:        Mood and Affect: Mood normal.        Behavior: Behavior normal.        Assessment/Plan: 1. Type 2 diabetes mellitus with hyperglycemia, without long-term current use of insulin  (HCC) (Primary) Stable and  at target  - POCT HgB A1C - Urine Microalbumin w/creat. 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 Menopausal disorder Will continue on Effexor  for now, add low dose HRT - estradiol  (CLIMARA  - DOSED IN MG/24 HR) 0.075 mg/24hr patch; Apply one patch to clean skin every 4 th day  Dispense: 8 patch; Refill: 12  4. Recurrent cold sores Lesion is non specific, will give a trial of antiviral  - acyclovir   (ZOVIRAX ) 800 MG tablet; One tab po at bedtime for cold sore  Dispense: 30 tablet; Refill: 4   General Counseling: Mandy Shea verbalizes understanding of the findings of todays visit and agrees with plan of treatment. I have discussed any further diagnostic evaluation that may be needed or ordered today. We also reviewed her medications today. she has been encouraged to call the  office with any questions or concerns that should arise related to todays visit.    Orders Placed This Encounter  Procedures   Urine Microalbumin w/creat. ratio   POCT HgB A1C    Meds ordered this encounter  Medications   estradiol  (CLIMARA  - DOSED IN MG/24 HR) 0.075 mg/24hr patch    Sig: Apply one patch to clean skin every 4 th day    Dispense:  8 patch    Refill:  12   acyclovir  (ZOVIRAX ) 800 MG tablet    Sig: One tab po at bedtime for cold sore    Dispense:  30 tablet    Refill:  4    Total time spent:35 Minutes Time spent includes review of chart, medications, test results, and follow up plan with the patient.   [place] Controlled Substance Database was reviewed by me.   Dr Ganon Demasi M Clementina Mareno Internal medicine

## 2023-03-16 LAB — MICROALBUMIN / CREATININE URINE RATIO
Creatinine, Urine: 325.7 mg/dL
Microalb/Creat Ratio: 10 mg/g{creat} (ref 0–29)
Microalbumin, Urine: 31.5 ug/mL

## 2023-04-02 ENCOUNTER — Encounter: Payer: Self-pay | Admitting: Internal Medicine

## 2023-04-05 ENCOUNTER — Encounter: Payer: Self-pay | Admitting: Internal Medicine

## 2023-04-06 NOTE — Telephone Encounter (Signed)
 Spoke  with pt  that she is feeling better medication is working and asper dr Meredeth Stallion advised doing treatment at discuss at visit until next follow up

## 2023-04-18 ENCOUNTER — Encounter: Payer: Self-pay | Admitting: Nurse Practitioner

## 2023-04-18 ENCOUNTER — Telehealth (INDEPENDENT_AMBULATORY_CARE_PROVIDER_SITE_OTHER): Payer: 59 | Admitting: Nurse Practitioner

## 2023-04-18 VITALS — Resp 16 | Ht 66.0 in | Wt 156.0 lb

## 2023-04-18 DIAGNOSIS — T3695XA Adverse effect of unspecified systemic antibiotic, initial encounter: Secondary | ICD-10-CM

## 2023-04-18 DIAGNOSIS — J0110 Acute frontal sinusitis, unspecified: Secondary | ICD-10-CM | POA: Diagnosis not present

## 2023-04-18 DIAGNOSIS — B379 Candidiasis, unspecified: Secondary | ICD-10-CM | POA: Diagnosis not present

## 2023-04-18 DIAGNOSIS — R051 Acute cough: Secondary | ICD-10-CM

## 2023-04-18 MED ORDER — FLUCONAZOLE 150 MG PO TABS
150.0000 mg | ORAL_TABLET | Freq: Once | ORAL | 0 refills | Status: AC
Start: 2023-04-18 — End: 2023-04-18

## 2023-04-18 MED ORDER — BENZONATATE 200 MG PO CAPS
200.0000 mg | ORAL_CAPSULE | Freq: Two times a day (BID) | ORAL | 0 refills | Status: DC | PRN
Start: 2023-04-18 — End: 2023-09-15

## 2023-04-18 MED ORDER — AZITHROMYCIN 250 MG PO TABS
250.0000 mg | ORAL_TABLET | Freq: Every day | ORAL | 0 refills | Status: AC
Start: 2023-04-18 — End: 2023-04-28

## 2023-04-18 NOTE — Progress Notes (Signed)
[facility] [address] [place], [place] [address]  Internal MEDICINE  Telephone Visit  Patient Name: Mandy Shea  [id number]  [id number]  Date of Service: 04/18/2023  I connected with the patient at 1230 by telephone and verified the patients identity using two identifiers.   I discussed the limitations, risks, security and privacy concerns of performing an evaluation and management service by telephone and the availability of in person appointments. I also discussed with the patient that there may be a patient responsible charge related to the service.  The patient expressed understanding and agrees to proceed.    Chief Complaint  Patient presents with   Telephone Screen    Coughing, spitting up bloody mucus.    Telephone Assessment    HPI Mandy Shea presents for a telehealth virtual visit for possible URI --onset of symptoms was last week --reports cough, yellow mucous with pink-tinged, ear pain, sore throat, nasal congestion, runny nose, SOB, chest tightness, fatigue. Denies any fever or chills, no sinus pressure Taking coricidin but not improving.    Current Medication: Outpatient Encounter Medications as of 04/18/2023  Medication Sig Note   acyclovir (ZOVIRAX) 800 MG tablet One tab po at bedtime for cold sore    albuterol (VENTOLIN HFA) 108 (90 Base) MCG/ACT inhaler Inhale 2 puffs into the lungs every 6 (six) hours as needed for wheezing or shortness of breath.    aspirin EC 81 MG tablet Take 1 tablet (81 mg total) by mouth daily. Swallow whole.    azithromycin (ZITHROMAX) 250 MG tablet Take 1 tablet (250 mg total) by mouth daily for 10 days.    benzonatate (TESSALON) 200 MG capsule Take 1 capsule (200 mg total) by mouth 2 (two) times daily as needed.    budesonide-formoterol (SYMBICORT) 160-4.5 MCG/ACT inhaler Inhale 2 puffs into the lungs 2 (two) times daily.    Dupilumab (DUPIXENT [place]) Inject 300 mg into the skin. 10/27/2022: Every other week   estradiol  (CLIMARA - DOSED IN MG/24 HR) 0.075 mg/24hr patch Apply one patch to clean skin every 4 th day    fluconazole (DIFLUCAN) 150 MG tablet Take 1 tablet (150 mg total) by mouth once for 1 dose. May take an additional dose after 3 days if still symptomatic.    glucose blood (ONETOUCH VERIO) test strip Use 1 test strip to check glucose once daily and as needed    Lancets (ONETOUCH DELICA PLUS LANCET30G) MISC Use 1 lancet to check glucose once daily and as needed    loratadine (CLARITIN) 10 MG tablet Take 1 tablet (10 mg total) by mouth daily.    montelukast (SINGULAIR) 10 MG tablet Take 1 tablet (10 mg total) by mouth daily.    Semaglutide, 2 MG/DOSE, (OZEMPIC, 2 MG/DOSE,) 8 MG/3ML SOPN Inject 2 mg into the skin once a week. FOR TYPE 2 DIABETES E11.69, E11.65    venlafaxine XR (EFFEXOR-XR) 75 MG 24 hr capsule Take 1 capsule (75 mg total) by mouth daily with breakfast.    No facility-administered encounter medications on file as of 04/18/2023.    Surgical History: Past Surgical History:  Procedure Laterality Date   CHOLECYSTECTOMY  09-26-13   COLONOSCOPY WITH PROPOFOL N/A 01/05/2021   Procedure: COLONOSCOPY WITH PROPOFOL;  Surgeon: Wyline Mood, MD;  Location: [facility] ENDOSCOPY;  Service: Gastroenterology;  Laterality: N/A;   CYSTOSCOPY N/A 01/12/2017   Procedure: CYSTOSCOPY;  Surgeon: Vena Austria, MD;  Location: [facility] ORS;  Service: Gynecology;  Laterality: N/A;   HYSTEROSCOPY WITH D & C  11/24/2016   Procedure: DILATATION AND CURETTAGE /HYSTEROSCOPY;  Surgeon: Vena Austria, MD;  Location: [facility] ORS;  Service: Gynecology;;   KNEE SURGERY Right    LEEP  2010   [facility]   TONSILLECTOMY  2014   TOTAL LAPAROSCOPIC HYSTERECTOMY WITH SALPINGECTOMY Right 01/12/2017   Procedure: TOTAL LAPAROSCOPIC HYSTERECTOMY WITH RIGHT SALPINGECTOMY;  Surgeon: Vena Austria, MD;  Location: [facility] ORS;  Service: Gynecology;  Laterality: Right;   TUBAL LIGATION  1997    Medical History: Past Medical History:   Diagnosis Date   Anemia    AS A CHILD   Diabetes mellitus without complication (HCC)    Fibroids    Hemorrhoids    Seasonal allergies    Sleep apnea    NO CPAP    Family History: Family History  Problem Relation Age of Onset   Diabetes Sister    Breast cancer Neg Hx     Social History   Socioeconomic History   Marital status: Married    Spouse name: Not on file   Number of children: Not on file   Years of education: Not on file   Highest education level: Not on file  Occupational History   Not on file  Tobacco Use   Smoking status: Never   Smokeless tobacco: Never  Vaping Use   Vaping status: Never Used  Substance and Sexual Activity   Alcohol use: No   Drug use: No   Sexual activity: Yes    Birth control/protection: None  Other Topics Concern   Not on file  Social History Narrative   Not on file   Social Drivers of Health   Financial Resource Strain: Not on file  Food Insecurity: Not on file  Transportation Needs: Not on file  Physical Activity: Not on file  Stress: Not on file  Social Connections: Not on file  Intimate Partner Violence: Not on file      Review of Systems  Constitutional:  Positive for fatigue. Negative for chills and fever.  HENT:  Positive for congestion, postnasal drip, rhinorrhea, sore throat and voice change. Negative for sinus pressure, sinus pain and sneezing.   Respiratory:  Positive for cough, chest tightness and shortness of breath. Negative for wheezing.   Cardiovascular:  Negative for chest pain and palpitations.  Gastrointestinal:  Negative for diarrhea, nausea and vomiting.  Musculoskeletal:  Negative for myalgias.  Neurological:  Positive for headaches.    Vital Signs: Resp 16   Ht 5\' 6"  (1.676 m)   Wt 156 lb (70.8 kg)   LMP 12/28/2016   BMI 25.18 kg/m    Observation/Objective: She is alert and oriented. No acute distress noted.     Assessment/Plan: 1. Acute non-recurrent frontal sinusitis  (Primary) Azithromycin prescribed, take until gone  - azithromycin (ZITHROMAX) 250 MG tablet; Take 1 tablet (250 mg total) by mouth daily for 10 days.  Dispense: 10 tablet; Refill: 0  2. Acute cough Medication prescribed for cough - benzonatate (TESSALON) 200 MG capsule; Take 1 capsule (200 mg total) by mouth 2 (two) times daily as needed.  Dispense: 30 capsule; Refill: 0  3. Antibiotic-induced yeast infection Fluconazole prescribed just in case,  - fluconazole (DIFLUCAN) 150 MG tablet; Take 1 tablet (150 mg total) by mouth once for 1 dose. May take an additional dose after 3 days if still symptomatic.  Dispense: 3 tablet; Refill: 0   General Counseling: Kita verbalizes understanding of the findings of today's phone visit and agrees with plan of treatment. I have discussed  any further diagnostic evaluation that may be needed or ordered today. We also reviewed her medications today. she has been encouraged to call the office with any questions or concerns that should arise related to todays visit.  Return if symptoms worsen or fail to improve.   No orders of the defined types were placed in this encounter.   Meds ordered this encounter  Medications   azithromycin (ZITHROMAX) 250 MG tablet    Sig: Take 1 tablet (250 mg total) by mouth daily for 10 days.    Dispense:  10 tablet    Refill:  0    Fill new script   fluconazole (DIFLUCAN) 150 MG tablet    Sig: Take 1 tablet (150 mg total) by mouth once for 1 dose. May take an additional dose after 3 days if still symptomatic.    Dispense:  3 tablet    Refill:  0   benzonatate (TESSALON) 200 MG capsule    Sig: Take 1 capsule (200 mg total) by mouth 2 (two) times daily as needed.    Dispense:  30 capsule    Refill:  0    Fill new script today    Time spent:10 Minutes Time spent with patient included reviewing progress notes, labs, imaging studies, and discussing plan for follow up.  [place] Controlled Substance Database was reviewed by me  for overdose risk score (ORS) if appropriate.  This patient was seen by Sallyanne Kuster, FNP-C in collaboration with Dr. Beverely Risen as a part of collaborative care agreement.  Latrise Bowland R. Tedd Sias, MSN, FNP-C Internal medicine

## 2023-04-24 ENCOUNTER — Encounter: Payer: Self-pay | Admitting: Cardiology

## 2023-04-24 ENCOUNTER — Ambulatory Visit: Payer: 59 | Attending: Cardiology | Admitting: Cardiology

## 2023-04-24 VITALS — BP 100/70 | HR 74 | Ht 64.0 in | Wt 157.0 lb

## 2023-04-24 DIAGNOSIS — I2510 Atherosclerotic heart disease of native coronary artery without angina pectoris: Secondary | ICD-10-CM

## 2023-04-24 DIAGNOSIS — I10 Essential (primary) hypertension: Secondary | ICD-10-CM

## 2023-04-24 DIAGNOSIS — E1169 Type 2 diabetes mellitus with other specified complication: Secondary | ICD-10-CM | POA: Diagnosis not present

## 2023-04-24 DIAGNOSIS — E785 Hyperlipidemia, unspecified: Secondary | ICD-10-CM

## 2023-04-24 DIAGNOSIS — E1165 Type 2 diabetes mellitus with hyperglycemia: Secondary | ICD-10-CM | POA: Diagnosis not present

## 2023-04-24 NOTE — Patient Instructions (Signed)
 Medication Instructions:  Continue same medications *If you need a refill on your cardiac medications before your next appointment, please call your pharmacy*   Lab Work: None ordered   Testing/Procedures: None ordered   Follow-Up: At [facility], you and your health needs are our priority.  As part of our continuing mission to provide you with exceptional heart care, we have created designated Provider Care Teams.  These Care Teams include your primary Cardiologist (physician) and Advanced Practice Providers (APPs -  Physician Assistants and Nurse Practitioners) who all work together to provide you with the care you need, when you need it.  We recommend signing up for the patient portal called "MyChart".  Sign up information is provided on this After Visit Summary.  MyChart is used to connect with patients for Virtual Visits (Telemedicine).  Patients are able to view lab/test results, encounter notes, upcoming appointments, etc.  Non-urgent messages can be sent to your provider as well.   To learn more about what you can do with MyChart, go to [email or web address].    Your next appointment:  1 year   Call in Nov to schedule Feb appointment      Provider:  Charlsie Quest

## 2023-04-24 NOTE — Progress Notes (Signed)
 Cardiology Office Note:  .   Date:  04/24/2023  ID:  Mickel Baas, DOB 09/06/1965, MRN [id number] PCP: Sallyanne Kuster, NP  [facility] Providers Cardiologist:  None    History of Present Illness: .   Mandy Shea is a 58 y.o. female with a past medical history of primary hypertension, coronary artery disease, type 2 diabetes, asthma, obstructive sleep apnea, type 2 diabetes, hyperlipidemia, who is here today for follow-up on her coronary artery disease.   Patient presented to the [facility] emergency department via EMS on 10/26/2018 with complaints of substernal chest discomfort that started an hour prior to arrival.  She describes pain as a sharp stabbing type feeling.  She denied radiation or any associated or alleviating symptoms.  She denied any previous cardiac history.  When EMS arrived she was complaining of 10 out of 10 chest pain and was given 324 mg of aspirin and route to the emergency department pain decreased to 2 out of 10 on the pain scale.  Initial vital signs were stable.  Labs were potassium 3.4, blood glucose 163, BUN 23, WBCs of 3.9, high-sensitivity troponin trended 22, 23, 24, 23.  She was given sublingual nitro 0.4 mg in the emergency department during hospitalization she underwent an echocardiogram that revealed an LVEF of 60 to 65%, no regional wall motion abnormalities, G1DD, mild to moderate tricuspid regurgitation.  Patient was recommended she start statin therapy.  Potassium was supplemented.  She was considered stable for discharge 10/27/2022.  With atypical chest discomfort she was scheduled for an outpatient coronary CTA which revealed a coronary calcium score of 23.6 with minimal proximal LAD stenosis.  It was recommended to consider with preventative therapy and risk factor modification.    She was last seen in clinic 11/25/2022 and overall from a cardiac perspective has been doing well.  She was continued on her current medication regimen today.  There were  no changes that were made to her medication regimen and no further testing that was needed at the time.  She returns to clinic today stating that she has been doing well.  She recently was diagnosed with a bronchitis but shortness of breath has improved.  She denies any chest pain, lightheadedness/dizziness, palpitations, or peripheral edema.  She has noted that with her diabetes that her toes are cold primarily at night but has improved with wearing compression stockings through the day.  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  She states that she has been compliant with her current medication without any adverse side effects.  She denies any hospitalizations or visits to the emergency department.  ROS: 10 point review of systems has been reviewed and considered negative with exception was been listed in the HPI  Studies Reviewed: Marland Kitchen   EKG Interpretation Date/Time:  Monday April 24 2023 09:08:13 EST Ventricular Rate:  74 PR Interval:  176 QRS Duration:  88 QT Interval:  404 QTC Calculation: 448 R Axis:   -8  Text Interpretation: Normal sinus rhythm Low voltage QRS When compared with ECG of 25-Nov-2022 08:21, Confirmed by Charlsie Quest ([id number]) on 04/24/2023 9:09:31 AM    Coronary CTA 11/10/22 IMPRESSION: 1. Coronary calcium score of 23.6. This was 87th percentile for age and sex matched control.   2. Normal coronary origin with right dominance.   3. Minimal proximal LAD stenosis (<25%).   4. CAD-RADS 1. Minimal non-obstructive CAD (0-24%). Consider preventive therapy and risk factor modification.   TTE 10/27/22 1.  Left ventricular ejection fraction, by estimation, is 60 to 65%. The  left ventricle has normal function. The left ventricle has no regional  wall motion abnormalities. There is mild left ventricular hypertrophy.  Left ventricular diastolic parameters  are consistent with Grade I diastolic dysfunction (impaired relaxation).   2. Right ventricular  systolic function is normal. The right ventricular  size is normal. There is normal pulmonary artery systolic pressure. The  estimated right ventricular systolic pressure is 26.3 mmHg.   3. The mitral valve is normal in structure. No evidence of mitral valve  regurgitation. No evidence of mitral stenosis.   4. Tricuspid valve regurgitation is mild to moderate.   5. The aortic valve has an indeterminant number of cusps. Aortic valve  regurgitation is not visualized. No aortic stenosis is present.   6. The inferior vena cava is normal in size with greater than 50%  respiratory variability, suggesting right atrial pressure of 3 mmHg.    Risk Assessment/Calculations:             Physical Exam:   VS:  BP 100/70 (BP Location: Left Arm, Patient Position: Sitting, Cuff Size: Normal)   Pulse 74   Ht 5\' 4"  (1.626 m)   Wt 157 lb (71.2 kg)   LMP 12/28/2016   SpO2 98%   BMI 26.95 kg/m    Wt Readings from Last 3 Encounters:  04/24/23 157 lb (71.2 kg)  04/18/23 156 lb (70.8 kg)  03/14/23 156 lb (70.8 kg)    GEN: Well nourished, well developed in no acute distress NECK: No JVD; No carotid bruits CARDIAC: RRR, no murmurs, rubs, gallops RESPIRATORY:  Clear to auscultation without rales, wheezing or rhonchi  ABDOMEN: Soft, non-tender, non-distended EXTREMITIES:  No edema; No deformity   ASSESSMENT AND PLAN: .   Nonobstructive coronary artery disease noted on coronary CTA with a score of 23.6 which was 87 percentile for age and sex matched control with less than 25% stenosis noted in the proximal LAD.  She is continued on aspirin 81 mg daily.  Had previously been recommended to start statin therapy.  Continues to deny any anginal anginal habits.  EKG today reveals sinus rhythm with a rate of 74 with low voltage which is no acute change from prior studies.  Hypertension with a blood pressure today of 100/71.  She continues to remain off of any antihypertensive medications.  Encouraged to continue  to monitor pressures at home on a regular basis.  Mixed hyperlipidemia with an LDL of 114 in 10/17/2021 and 11/18/2022.  Was recommended during hospitalization to start statin therapy.  Currently is not on statin therapy.  With longstanding history of diabetes was recommended statin therapy which she had previously declined.  10 year ASCVD risk of 3.6%, goal statin initiation is indicated, with patients with multiple ASCVD risk factors is reasonable to initiate high intensity statin therapy to reduce the LDL-C by 50%. Will re-discuss on return again.  Type 2 diabetes with a hemoglobin A1c of 6.1.  She continues to [employer] and has ongoing management by her PCP       Dispo: Patient to follow-up with MD/APP in 1 year or sooner if needed  Signed, Jakyah Bradby, NP

## 2023-04-26 ENCOUNTER — Telehealth: Payer: Self-pay | Admitting: Nurse Practitioner

## 2023-04-26 NOTE — Telephone Encounter (Signed)
 Lvm & sent mychart msg to move 10/27/23 appointment-Toni

## 2023-06-20 ENCOUNTER — Ambulatory Visit: Payer: 59 | Admitting: Nurse Practitioner

## 2023-06-28 ENCOUNTER — Other Ambulatory Visit: Payer: Self-pay

## 2023-07-06 ENCOUNTER — Encounter: Payer: Self-pay | Admitting: Nurse Practitioner

## 2023-07-06 ENCOUNTER — Ambulatory Visit (INDEPENDENT_AMBULATORY_CARE_PROVIDER_SITE_OTHER): Admitting: Nurse Practitioner

## 2023-07-06 VITALS — BP 130/80 | HR 74 | Temp 97.8°F | Resp 16 | Ht 64.0 in | Wt 161.4 lb

## 2023-07-06 DIAGNOSIS — E1165 Type 2 diabetes mellitus with hyperglycemia: Secondary | ICD-10-CM

## 2023-07-06 DIAGNOSIS — E538 Deficiency of other specified B group vitamins: Secondary | ICD-10-CM

## 2023-07-06 DIAGNOSIS — E785 Hyperlipidemia, unspecified: Secondary | ICD-10-CM

## 2023-07-06 DIAGNOSIS — J028 Acute pharyngitis due to other specified organisms: Secondary | ICD-10-CM | POA: Diagnosis not present

## 2023-07-06 DIAGNOSIS — E1169 Type 2 diabetes mellitus with other specified complication: Secondary | ICD-10-CM

## 2023-07-06 DIAGNOSIS — B379 Candidiasis, unspecified: Secondary | ICD-10-CM

## 2023-07-06 DIAGNOSIS — J301 Allergic rhinitis due to pollen: Secondary | ICD-10-CM

## 2023-07-06 DIAGNOSIS — B9689 Other specified bacterial agents as the cause of diseases classified elsewhere: Secondary | ICD-10-CM

## 2023-07-06 DIAGNOSIS — E559 Vitamin D deficiency, unspecified: Secondary | ICD-10-CM | POA: Diagnosis not present

## 2023-07-06 DIAGNOSIS — E782 Mixed hyperlipidemia: Secondary | ICD-10-CM

## 2023-07-06 LAB — POCT GLYCOSYLATED HEMOGLOBIN (HGB A1C): Hemoglobin A1C: 6.1 % — AB (ref 4.0–5.6)

## 2023-07-06 MED ORDER — AZITHROMYCIN 250 MG PO TABS
ORAL_TABLET | ORAL | 0 refills | Status: AC
Start: 2023-07-06 — End: 2023-07-11

## 2023-07-06 MED ORDER — FLUCONAZOLE 150 MG PO TABS
150.0000 mg | ORAL_TABLET | Freq: Once | ORAL | 0 refills | Status: AC
Start: 2023-07-06 — End: 2023-07-06

## 2023-07-06 NOTE — Progress Notes (Signed)
 [facility] [address] [place], [place] [address]  Internal MEDICINE  Office Visit Note  Patient Name: Mandy Shea  [id number]  [id number]  Date of Service: 07/06/2023  Chief Complaint  Patient presents with   Follow-up   Diabetes   Allergic Rhinitis     HPI Mandy Shea presents for a follow-up visit for diabetes, sinus infection, high cholesterol, need lab ordered  Diabetes -- A1c is significantly improved to 6.1 today from 8.8 previously. Currently taking ozempic  Sinus drainage and allergic rhinitis -- thick yellow/green sinus drainage, runny nose, nasal congestion, cough. No sinus pressure or pain or headache.  High cholesterol -- need to check cholesterol panel.  Low vitamin D  Low B12     Current Medication: Outpatient Encounter Medications as of 07/06/2023  Medication Sig Note   acyclovir  (ZOVIRAX ) 800 MG tablet One tab po at bedtime for cold sore    albuterol  (VENTOLIN  HFA) 108 (90 Base) MCG/ACT inhaler Inhale 2 puffs into the lungs every 6 (six) hours as needed for wheezing or shortness of breath.    aspirin  EC 81 MG tablet Take 1 tablet (81 mg total) by mouth daily. Swallow whole.    [EXPIRED] azithromycin  (ZITHROMAX ) 250 MG tablet Take 2 tablets on day 1, then 1 tablet daily on days 2 through 5    benzonatate  (TESSALON ) 200 MG capsule Take 1 capsule (200 mg total) by mouth 2 (two) times daily as needed.    budesonide -formoterol  (SYMBICORT ) 160-4.5 MCG/ACT inhaler Inhale 2 puffs into the lungs 2 (two) times daily.    Dupilumab (DUPIXENT [place]) Inject 300 mg into the skin. 10/27/2022: Every other week   estradiol  (CLIMARA  - DOSED IN MG/24 HR) 0.075 mg/24hr patch Apply one patch to clean skin every 4 th day    [EXPIRED] fluconazole  (DIFLUCAN ) 150 MG tablet Take 1 tablet (150 mg total) by mouth once for 1 dose. May take an additional dose after 3 days if still symptomatic.    glucose blood (ONETOUCH VERIO) test strip Use 1 test strip to check glucose once daily and  as needed    Lancets (ONETOUCH DELICA PLUS LANCET30G) MISC Use 1 lancet to check glucose once daily and as needed    loratadine  (CLARITIN ) 10 MG tablet Take 1 tablet (10 mg total) by mouth daily.    montelukast  (SINGULAIR ) 10 MG tablet Take 1 tablet (10 mg total) by mouth daily.    Semaglutide , 2 MG/DOSE, (OZEMPIC , 2 MG/DOSE,) 8 MG/3ML SOPN Inject 2 mg into the skin once a week. FOR TYPE 2 DIABETES E11.69, E11.65    venlafaxine  XR (EFFEXOR -XR) 75 MG 24 hr capsule Take 1 capsule (75 mg total) by mouth daily with breakfast.    No facility-administered encounter medications on file as of 07/06/2023.    Surgical History: Past Surgical History:  Procedure Laterality Date   CHOLECYSTECTOMY  09-26-13   COLONOSCOPY WITH PROPOFOL  N/A 01/05/2021   Procedure: COLONOSCOPY WITH PROPOFOL ;  Surgeon: Luke Salaam, MD;  Location: [facility] ENDOSCOPY;  Service: Gastroenterology;  Laterality: N/A;   CYSTOSCOPY N/A 01/12/2017   Procedure: CYSTOSCOPY;  Surgeon: Darl Edu, MD;  Location: [facility] ORS;  Service: Gynecology;  Laterality: N/A;   HYSTEROSCOPY WITH D & C  11/24/2016   Procedure: DILATATION AND CURETTAGE /HYSTEROSCOPY;  Surgeon: Darl Edu, MD;  Location: [facility] ORS;  Service: Gynecology;;   KNEE SURGERY Right    LEEP  2010   [facility]   TONSILLECTOMY  2014   TOTAL LAPAROSCOPIC HYSTERECTOMY WITH SALPINGECTOMY Right 01/12/2017   Procedure:  TOTAL LAPAROSCOPIC HYSTERECTOMY WITH RIGHT SALPINGECTOMY;  Surgeon: Darl Edu, MD;  Location: [facility] ORS;  Service: Gynecology;  Laterality: Right;   TUBAL LIGATION  1997    Medical History: Past Medical History:  Diagnosis Date   Anemia    AS A CHILD   Diabetes mellitus without complication (HCC)    Fibroids    Hemorrhoids    Seasonal allergies    Sleep apnea    NO CPAP    Family History: Family History  Problem Relation Age of Onset   Diabetes Sister    Breast cancer Neg Hx     Social History   Socioeconomic History   Marital status:  Married    Spouse name: Not on file   Number of children: Not on file   Years of education: Not on file   Highest education level: Not on file  Occupational History   Not on file  Tobacco Use   Smoking status: Never   Smokeless tobacco: Never  Vaping Use   Vaping status: Never Used  Substance and Sexual Activity   Alcohol use: No   Drug use: No   Sexual activity: Yes    Birth control/protection: None  Other Topics Concern   Not on file  Social History Narrative   Not on file   Social Drivers of Health   Financial Resource Strain: Not on file  Food Insecurity: Not on file  Transportation Needs: Not on file  Physical Activity: Not on file  Stress: Not on file  Social Connections: Not on file  Intimate Partner Violence: Not on file      Review of Systems  Constitutional:  Positive for fatigue. Negative for chills and fever.  HENT:  Positive for congestion, postnasal drip, rhinorrhea, sore throat and voice change. Negative for sinus pressure, sinus pain and sneezing.   Respiratory:  Positive for cough, chest tightness and shortness of breath. Negative for wheezing.   Cardiovascular:  Negative for chest pain and palpitations.  Gastrointestinal:  Negative for diarrhea, nausea and vomiting.  Musculoskeletal:  Negative for myalgias.  Neurological:  Positive for headaches.    Vital Signs: BP 130/80   Pulse 74   Temp 97.8 F (36.6 C)   Resp 16   Ht 5\' 4"  (1.626 m)   Wt 161 lb 6.4 oz (73.2 kg)   LMP 12/28/2016   SpO2 98%   BMI 27.70 kg/m    Physical Exam Vitals reviewed.  Constitutional:      General: She is not in acute distress.    Appearance: Normal appearance. She is not ill-appearing.  HENT:     Head: Normocephalic and atraumatic.  Eyes:     Pupils: Pupils are equal, round, and reactive to light.  Cardiovascular:     Rate and Rhythm: Normal rate and regular rhythm.  Pulmonary:     Effort: Pulmonary effort is normal. No respiratory distress.   Neurological:     Mental Status: She is alert and oriented to person, place, and time.  Psychiatric:        Mood and Affect: Mood normal.        Behavior: Behavior normal.        Assessment/Plan: 1. Acute bacterial pharyngitis (Primary) Zpak prescribed. Take until gone. - azithromycin  (ZITHROMAX ) 250 MG tablet; Take 2 tablets on day 1, then 1 tablet daily on days 2 through 5  Dispense: 6 tablet; Refill: 0  2. Type 2 diabetes mellitus with other specified complication, without long-term current use of insulin  (  HCC) A1c is significantly improved and stable. Continue ozempic  as prescribed. Routine labs ordered  - POCT HgB A1C - CBC with Differential/Platelet - CMP14+EGFR - Lipid Profile - Vitamin D  (25 hydroxy) - B12 and Folate Panel - Iron, TIBC and Ferritin Panel  3. Hyperlipidemia associated with type 2 diabetes mellitus (HCC) Routine labs ordered  - CBC with Differential/Platelet - CMP14+EGFR - Lipid Profile - Vitamin D  (25 hydroxy) - B12 and Folate Panel - Iron, TIBC and Ferritin Panel  4. B12 deficiency Routine labs ordered  - CBC with Differential/Platelet - CMP14+EGFR - Lipid Profile - Vitamin D  (25 hydroxy) - B12 and Folate Panel - Iron, TIBC and Ferritin Panel  5. Vitamin D  deficiency Routine labs ordered  - CBC with Differential/Platelet - CMP14+EGFR - Lipid Profile - Vitamin D  (25 hydroxy) - B12 and Folate Panel - Iron, TIBC and Ferritin Panel  6. Seasonal allergic rhinitis due to pollen Continue loratadine  as prescribed   7. Antibiotic-induced yeast infection Fluconazole  prescribed just in case  - fluconazole  (DIFLUCAN ) 150 MG tablet; Take 1 tablet (150 mg total) by mouth once for 1 dose. May take an additional dose after 3 days if still symptomatic.  Dispense: 3 tablet; Refill: 0   General Counseling: Lizvette verbalizes understanding of the findings of todays visit and agrees with plan of treatment. I have discussed any further diagnostic  evaluation that may be needed or ordered today. We also reviewed her medications today. she has been encouraged to call the office with any questions or concerns that should arise related to todays visit.    Orders Placed This Encounter  Procedures   CBC with Differential/Platelet   CMP14+EGFR   Lipid Profile   Vitamin D  (25 hydroxy)   B12 and Folate Panel   Iron, TIBC and Ferritin Panel   POCT HgB A1C    Meds ordered this encounter  Medications   azithromycin  (ZITHROMAX ) 250 MG tablet    Sig: Take 2 tablets on day 1, then 1 tablet daily on days 2 through 5    Dispense:  6 tablet    Refill:  0   fluconazole  (DIFLUCAN ) 150 MG tablet    Sig: Take 1 tablet (150 mg total) by mouth once for 1 dose. May take an additional dose after 3 days if still symptomatic.    Dispense:  3 tablet    Refill:  0    Return for previously scheduled, CPE, Kataya Guimont PCP in august.   Total time spent:30 Minutes Time spent includes review of chart, medications, test results, and follow up plan with the patient.   [place] Controlled Substance Database was reviewed by me.  This patient was seen by Laurence Pons, FNP-C in collaboration with Dr. Verneta Gone as a part of collaborative care agreement.   Philippa Vessey R. Bobbi Burow, MSN, FNP-C Internal medicine

## 2023-07-19 ENCOUNTER — Telehealth: Payer: Self-pay

## 2023-07-21 ENCOUNTER — Other Ambulatory Visit: Payer: Self-pay

## 2023-07-21 MED ORDER — AZITHROMYCIN 250 MG PO TABS: ORAL_TABLET | ORAL | 0 refills | Status: AC

## 2023-07-21 NOTE — Telephone Encounter (Signed)
 Per AA, sent Z-Pak for patient.

## 2023-08-05 ENCOUNTER — Encounter: Payer: Self-pay | Admitting: Nurse Practitioner

## 2023-08-14 ENCOUNTER — Ambulatory Visit
Admission: RE | Admit: 2023-08-14 | Discharge: 2023-08-14 | Disposition: A | Discharge status: Home / Self Care | Attending: Nurse Practitioner | Admitting: Nurse Practitioner

## 2023-08-14 ENCOUNTER — Ambulatory Visit
Admission: RE | Admit: 2023-08-14 | Discharge: 2023-08-14 | Disposition: A | Discharge status: Home / Self Care | Source: Ambulatory Visit | Attending: Nurse Practitioner | Admitting: Nurse Practitioner

## 2023-08-14 ENCOUNTER — Ambulatory Visit: Payer: Self-pay | Admitting: Nurse Practitioner

## 2023-08-14 ENCOUNTER — Ambulatory Visit: Admitting: Nurse Practitioner

## 2023-08-14 ENCOUNTER — Encounter: Payer: Self-pay | Admitting: Nurse Practitioner

## 2023-08-14 VITALS — BP 130/80 | HR 80 | Temp 98.2°F | Resp 16 | Ht 64.0 in | Wt 164.0 lb

## 2023-08-14 DIAGNOSIS — R269 Unspecified abnormalities of gait and mobility: Secondary | ICD-10-CM | POA: Diagnosis not present

## 2023-08-14 DIAGNOSIS — M25552 Pain in left hip: Secondary | ICD-10-CM

## 2023-08-14 DIAGNOSIS — M25551 Pain in right hip: Secondary | ICD-10-CM

## 2023-08-14 DIAGNOSIS — E1169 Type 2 diabetes mellitus with other specified complication: Secondary | ICD-10-CM | POA: Diagnosis not present

## 2023-08-14 MED ORDER — MELOXICAM 15 MG PO TABS
15.0000 mg | ORAL_TABLET | Freq: Every day | ORAL | 1 refills | Status: DC
Start: 2023-08-14 — End: 2023-10-23

## 2023-08-14 NOTE — Telephone Encounter (Signed)
 Patient notified

## 2023-08-14 NOTE — Telephone Encounter (Signed)
-----   Message from [facility] sent at 08/14/2023  1:30 PM EDT ----- Xray of hips is completely normal. Take meloxicam  as prescribed. If pain persists, we can refer to ortho or she can go to [facility] urgent [facility] if the pain worsens or is not tolerable.  ----- Message ----- From: Interface, Rad Results In Sent: 08/14/2023   1:19 PM EDT To: Laurence Pons, NP

## 2023-08-14 NOTE — Progress Notes (Signed)
 [facility] [address] [place], [place] [address]  Internal MEDICINE  Office Visit Note  Patient Name: Mandy Shea  [id number]  [id number]  Date of Service: 08/14/2023  Chief Complaint  Patient presents with   Acute Visit    Right hip pain, losing balance.      HPI Mandy Shea presents for an acute sick visit for acute bilateral hip pain  --onset of pain was about 1.5 months ago Pain in both hips but right hip is worse than left hip It is affect her gait and worse when she is going from sitting to standing.   No issues with diabetes or her medications at this time.    Current Medication:  Outpatient Encounter Medications as of 08/14/2023  Medication Sig Note   meloxicam  (MOBIC ) 15 MG tablet Take 1 tablet (15 mg total) by mouth daily.    acyclovir  (ZOVIRAX ) 800 MG tablet One tab po at bedtime for cold sore    albuterol  (VENTOLIN  HFA) 108 (90 Base) MCG/ACT inhaler Inhale 2 puffs into the lungs every 6 (six) hours as needed for wheezing or shortness of breath.    aspirin  EC 81 MG tablet Take 1 tablet (81 mg total) by mouth daily. Swallow whole.    benzonatate  (TESSALON ) 200 MG capsule Take 1 capsule (200 mg total) by mouth 2 (two) times daily as needed.    budesonide -formoterol  (SYMBICORT ) 160-4.5 MCG/ACT inhaler Inhale 2 puffs into the lungs 2 (two) times daily.    Dupilumab (DUPIXENT [place]) Inject 300 mg into the skin. 10/27/2022: Every other week   estradiol  (CLIMARA  - DOSED IN MG/24 HR) 0.075 mg/24hr patch Apply one patch to clean skin every 4 th day    glucose blood (ONETOUCH VERIO) test strip Use 1 test strip to check glucose once daily and as needed    Lancets (ONETOUCH DELICA PLUS LANCET30G) MISC Use 1 lancet to check glucose once daily and as needed    loratadine  (CLARITIN ) 10 MG tablet Take 1 tablet (10 mg total) by mouth daily.    montelukast  (SINGULAIR ) 10 MG tablet Take 1 tablet (10 mg total) by mouth daily.    Semaglutide , 2 MG/DOSE, (OZEMPIC , 2 MG/DOSE,)  8 MG/3ML SOPN Inject 2 mg into the skin once a week. FOR TYPE 2 DIABETES E11.69, E11.65    venlafaxine  XR (EFFEXOR -XR) 75 MG 24 hr capsule Take 1 capsule (75 mg total) by mouth daily with breakfast.    No facility-administered encounter medications on file as of 08/14/2023.      Medical History: Past Medical History:  Diagnosis Date   Anemia    AS A CHILD   Diabetes mellitus without complication (HCC)    Fibroids    Hemorrhoids    Seasonal allergies    Sleep apnea    NO CPAP     Vital Signs: BP 130/80   Pulse 80   Temp 98.2 F (36.8 C)   Resp 16   Ht 5' 4 (1.626 m)   Wt 164 lb (74.4 kg)   LMP 12/28/2016   SpO2 96%   BMI 28.15 kg/m    Review of Systems  Constitutional:  Positive for activity change and fatigue.  Respiratory:  Negative for cough, chest tightness, shortness of breath and wheezing.   Cardiovascular: Negative.  Negative for chest pain and palpitations.  Gastrointestinal: Negative.   Musculoskeletal:  Positive for arthralgias (bilateral hips), back pain and gait problem.    Physical Exam Vitals reviewed.  Constitutional:  General: She is not in acute distress.    Appearance: Normal appearance. She is not ill-appearing.  HENT:     Head: Normocephalic and atraumatic.  Eyes:     Pupils: Pupils are equal, round, and reactive to light.  Cardiovascular:     Rate and Rhythm: Normal rate and regular rhythm.  Pulmonary:     Effort: Pulmonary effort is normal. No respiratory distress.  Musculoskeletal:     Right hip: Tenderness present. Decreased range of motion. Decreased strength.     Left hip: Tenderness present. Decreased range of motion. Decreased strength.  Skin:    Capillary Refill: Capillary refill takes less than 2 seconds.  Neurological:     Mental Status: She is alert and oriented to person, place, and time.  Psychiatric:        Mood and Affect: Mood normal.        Behavior: Behavior normal.       Assessment/Plan: 1. Acute hip  pain, bilateral (Primary) Meloxicam  prescribed and hip xray ordered  - meloxicam  (MOBIC ) 15 MG tablet; Take 1 tablet (15 mg total) by mouth daily.  Dispense: 30 tablet; Refill: 1 - DG HIPS BILAT WITH PELVIS MIN 5 VIEWS; Future  2. Impaired gait Hip xray ordered  - DG HIPS BILAT WITH PELVIS MIN 5 VIEWS; Future  3. Type 2 diabetes mellitus with other specified complication, without long-term current use of insulin  (HCC) Continue medications as prescribed.    General Counseling: Mandy Shea verbalizes understanding of the findings of todays visit and agrees with plan of treatment. I have discussed any further diagnostic evaluation that may be needed or ordered today. We also reviewed her medications today. she has been encouraged to call the office with any questions or concerns that should arise related to todays visit.    Counseling:    Orders Placed This Encounter  Procedures   DG HIPS BILAT WITH PELVIS MIN 5 VIEWS    Meds ordered this encounter  Medications   meloxicam  (MOBIC ) 15 MG tablet    Sig: Take 1 tablet (15 mg total) by mouth daily.    Dispense:  30 tablet    Refill:  1    Fill new script today    Return if symptoms worsen or fail to improve, for keep regular scheduled follow up in august .  [place] Controlled Substance Database was reviewed by me for overdose risk score (ORS)  Time spent:30 Minutes Time spent with patient included reviewing progress notes, labs, imaging studies, and discussing plan for follow up.   This patient was seen by Mardy Maxin, FNP-C in collaboration with Dr. Sigrid Bathe as a part of collaborative care agreement.  Ninoska Goswick R. Maxin, MSN, FNP-C Internal Medicine

## 2023-08-14 NOTE — Progress Notes (Signed)
 Xray of hips is completely normal. Take meloxicam  as prescribed. If pain persists, we can refer to ortho or she can go to [facility] urgent care hours if the pain worsens or is not tolerable.

## 2023-09-09 IMAGING — MG MM DIGITAL DIAGNOSTIC UNILAT*R* W/ TOMO W/ CAD
4 series · 4 of 12 positions shown · non-contrast
Comparison: Previous exams.

CLINICAL DATA: Screening recall for possible right breast focal
asymmetry.

EXAM:
DIGITAL DIAGNOSTIC UNILATERAL RIGHT MAMMOGRAM WITH TOMOSYNTHESIS AND
CAD; ULTRASOUND RIGHT BREAST LIMITED
TECHNIQUE: Right digital diagnostic mammography and breast tomosynthesis was
performed. The images were evaluated with computer-aided detection.;
Targeted ultrasound examination of the right breast was performed

[R MLO synth-2D]
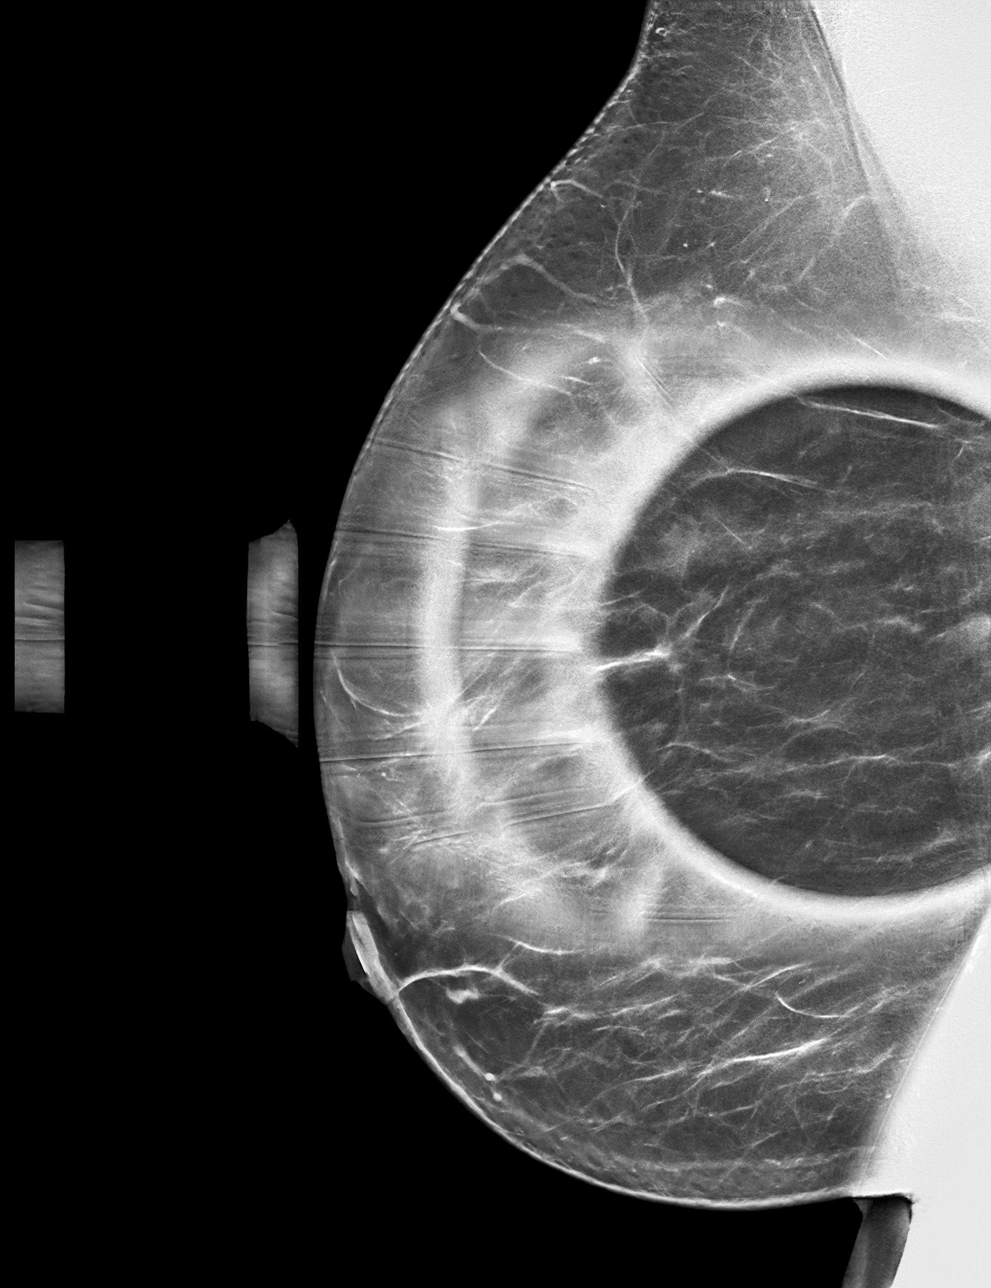

[R CC synth-2D]
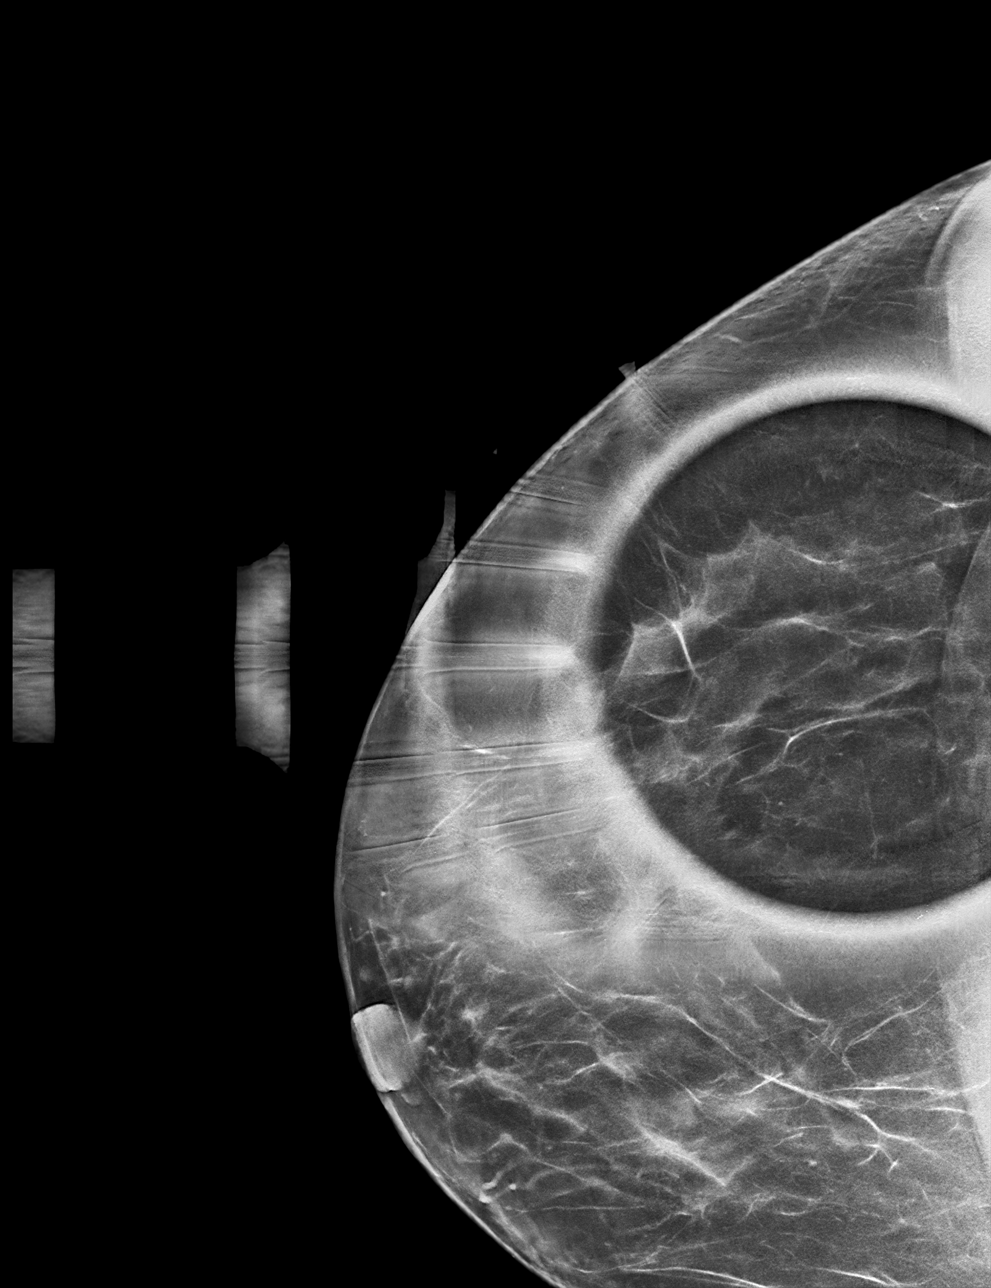

[R CC tomo · tomo slice 35/69.0]
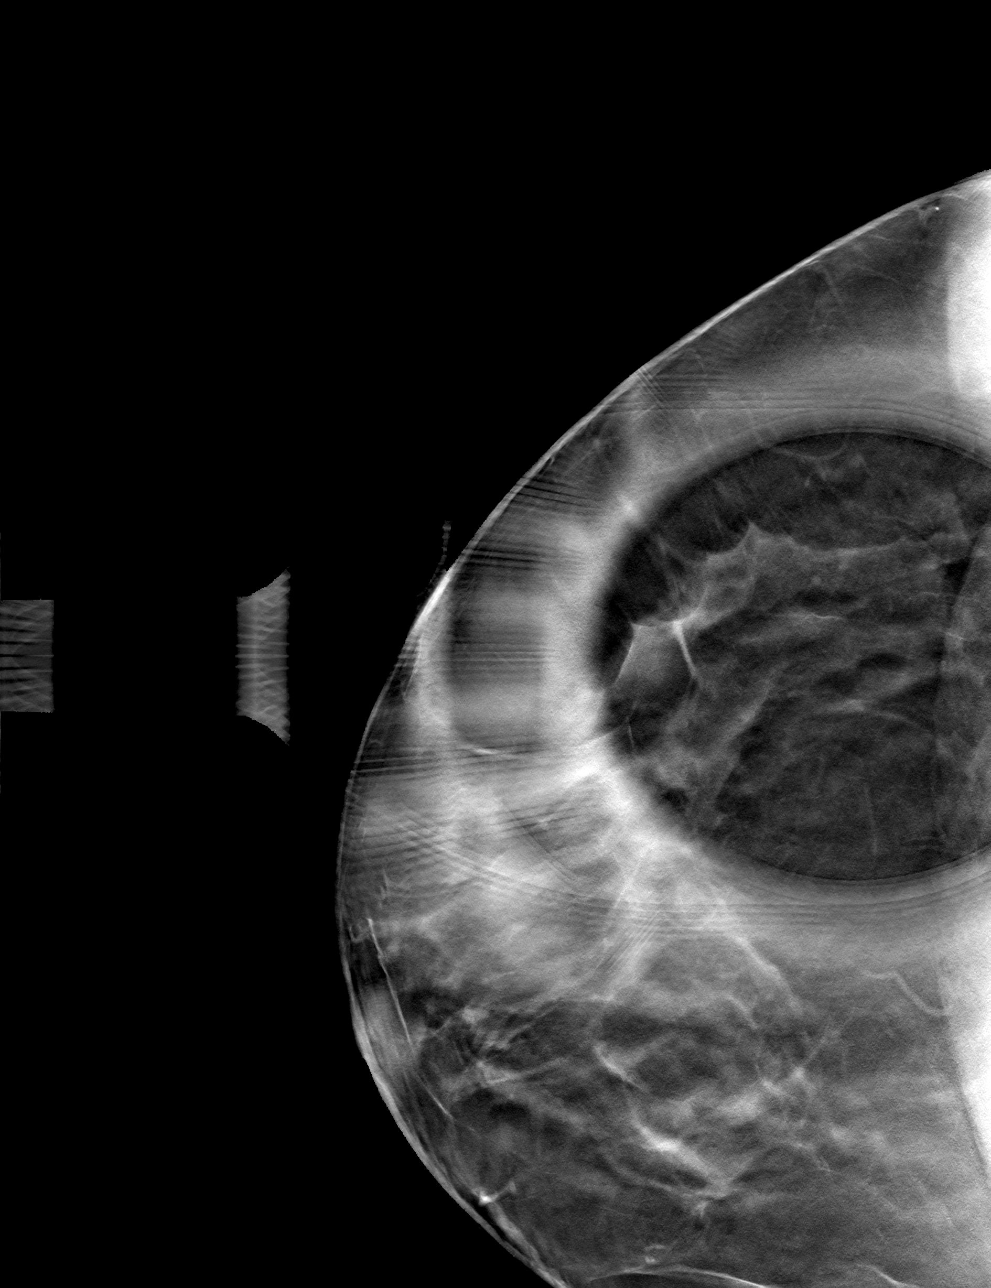

[R MLO tomo · tomo slice 33/64.0]
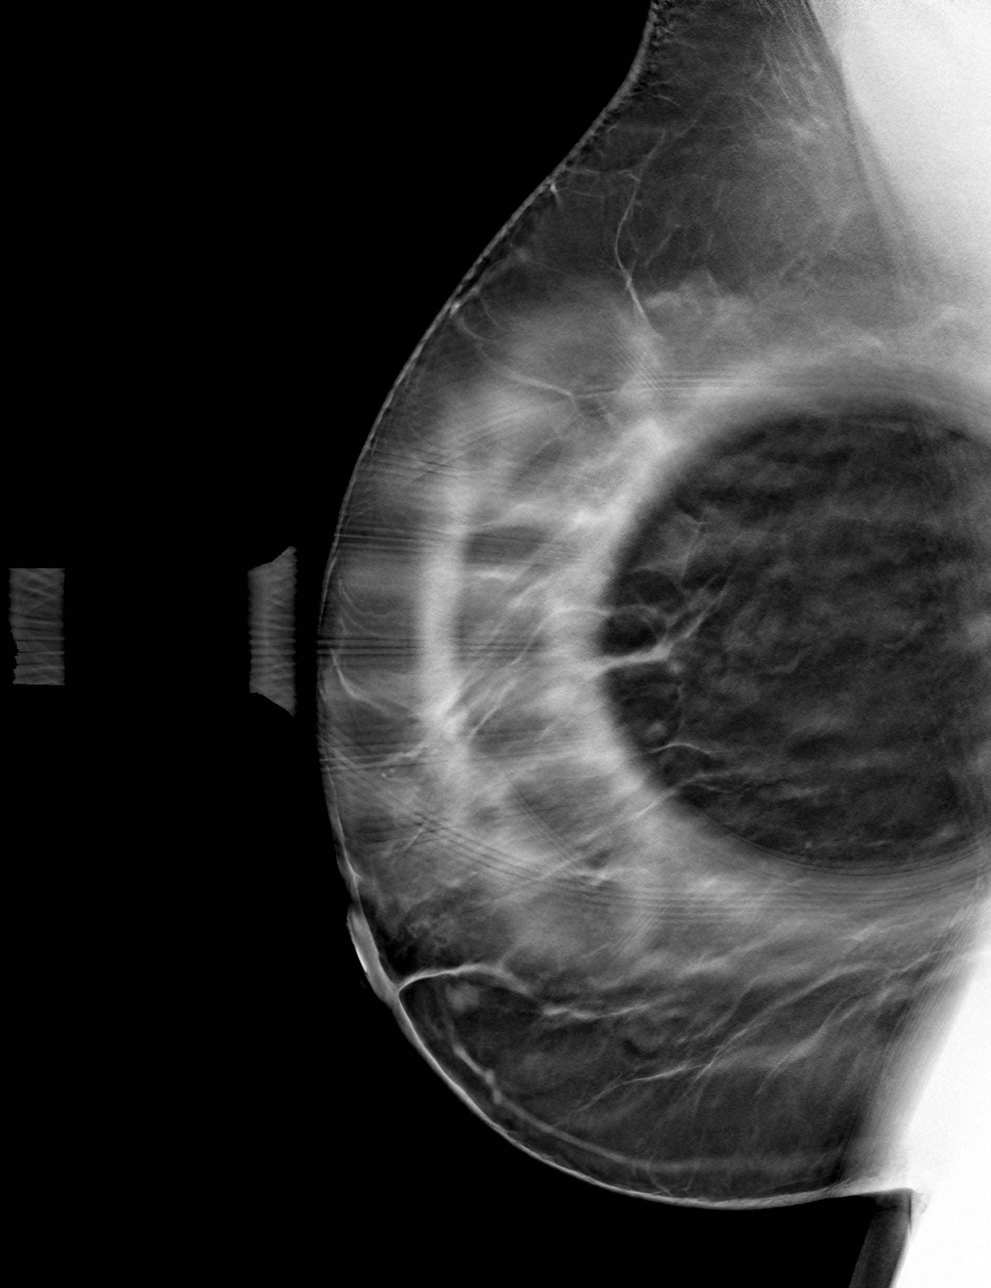

[4 of 12 positions shown; findings below may reference images not displayed]

ACR Breast Density Category c: The breast tissue is heterogeneously
dense, which may obscure small masses.
FINDINGS: Additional tomograms were performed of the right breast. There is an
oval mass in the far outer right breast measuring approximately
cm.

Targeted ultrasound of the right breast was performed. There is an
oval circumscribed near anechoic mass in the right breast at 9
o'clock 8 cm from nipple measuring 0.7 x 0.3 x 0.5 cm. This
demonstrates imaging features suggestive of a cluster of
cysts/complicated cysts and is felt to correspond with mammography
findings.
IMPRESSION: Probably benign cluster of cysts/complicated cyst in the right
breast.

RECOMMENDATION:
Recommend diagnostic mammography of the right breast with ultrasound
in 6 months.

I have discussed the findings and recommendations with the patient.
If applicable, a reminder letter will be sent to the patient
regarding the next appointment.

BI-RADS CATEGORY  3: Probably benign.

## 2023-09-09 IMAGING — US US BREAST*R* LIMITED INC AXILLA
1 series · 7 of 7 positions shown · non-contrast
Comparison: Previous exams.

CLINICAL DATA: Screening recall for possible right breast focal
asymmetry.

EXAM:
DIGITAL DIAGNOSTIC UNILATERAL RIGHT MAMMOGRAM WITH TOMOSYNTHESIS AND
CAD; ULTRASOUND RIGHT BREAST LIMITED
TECHNIQUE: Right digital diagnostic mammography and breast tomosynthesis was
performed. The images were evaluated with computer-aided detection.;
Targeted ultrasound examination of the right breast was performed

[Series 1: us breast*right* limited inc axilla · 0.06mm/px · 7 of 7 slices shown]
[im 1/7]
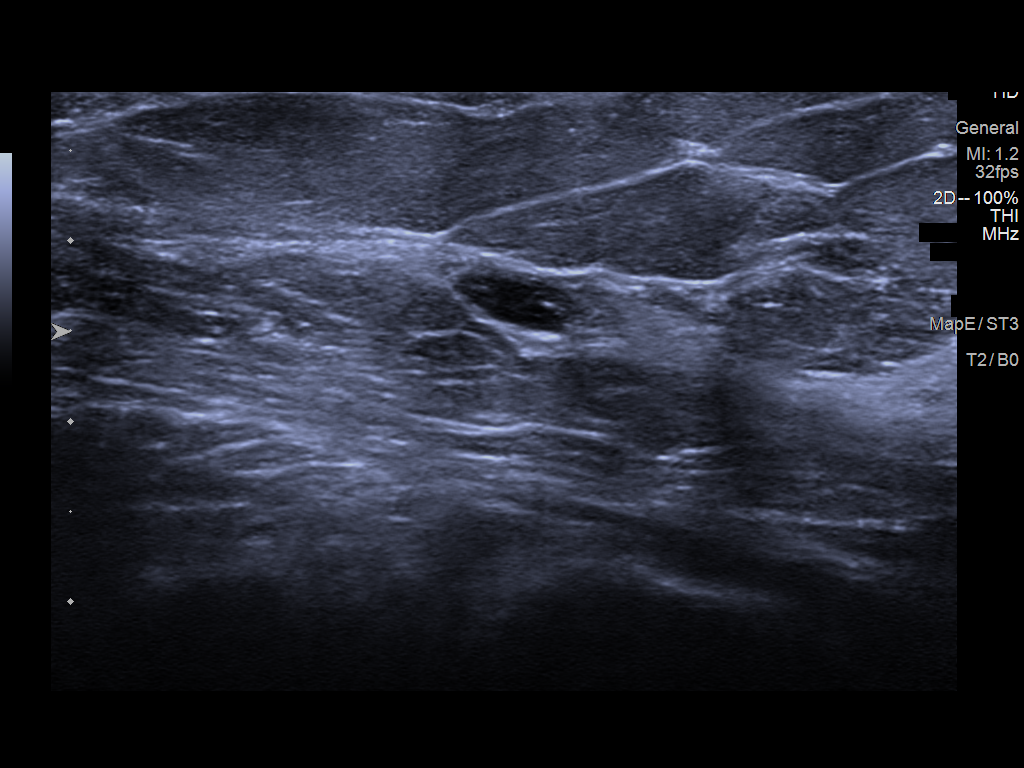
[im 2/7]
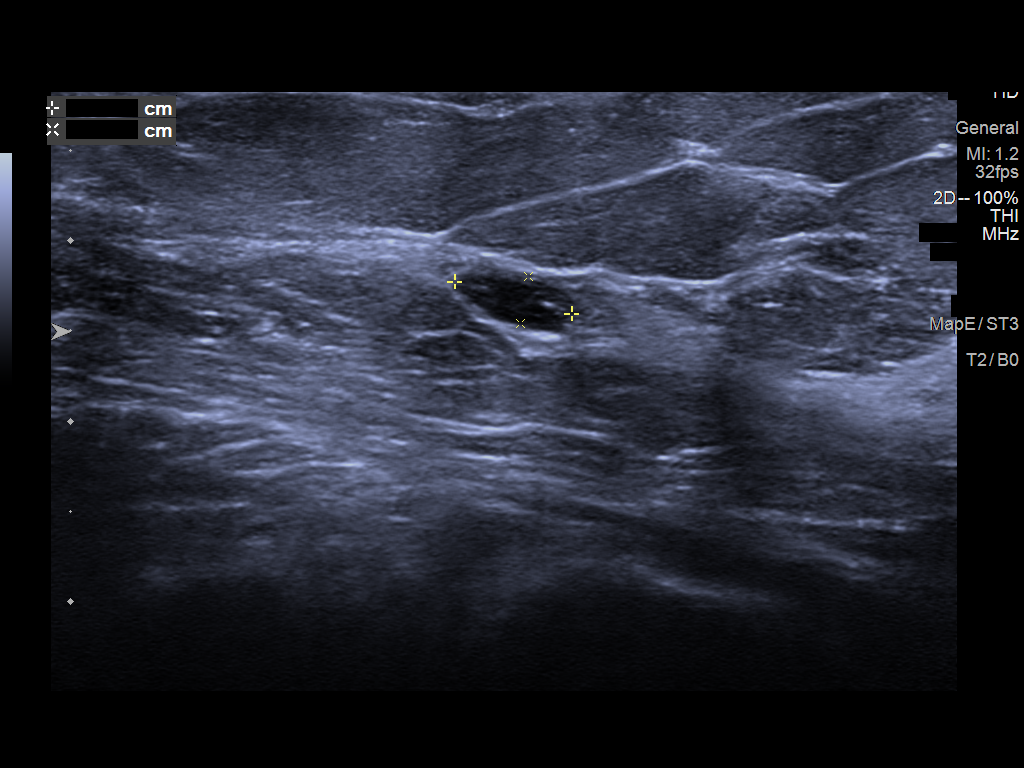
[im 3/7]
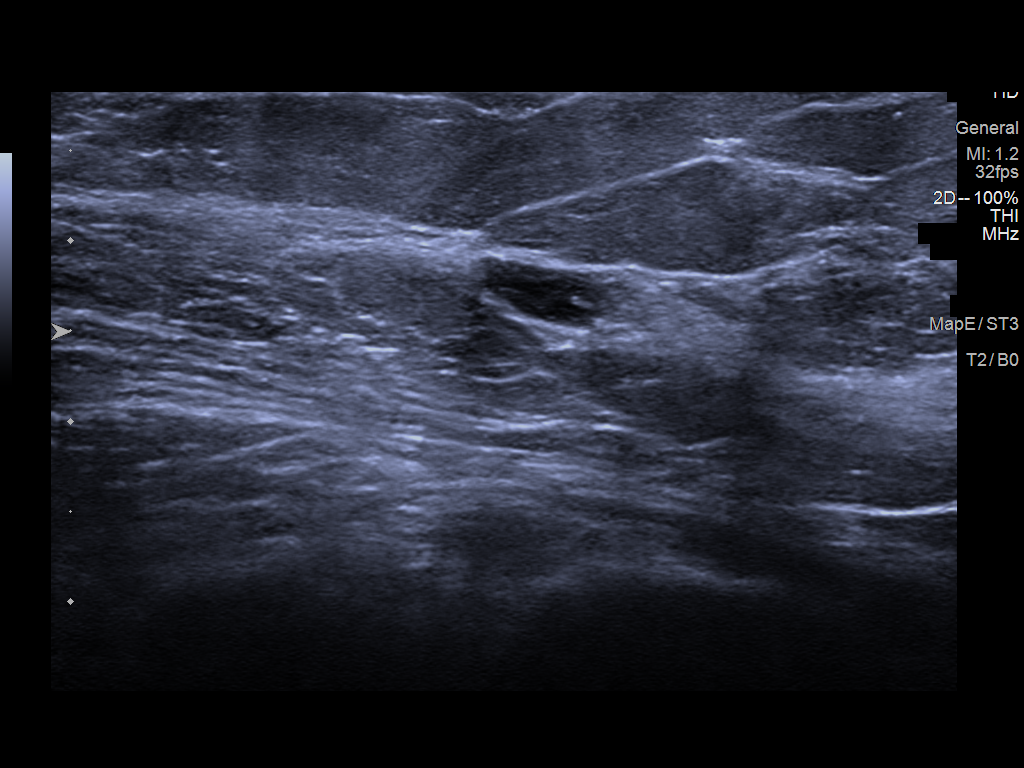
[im 4/7]
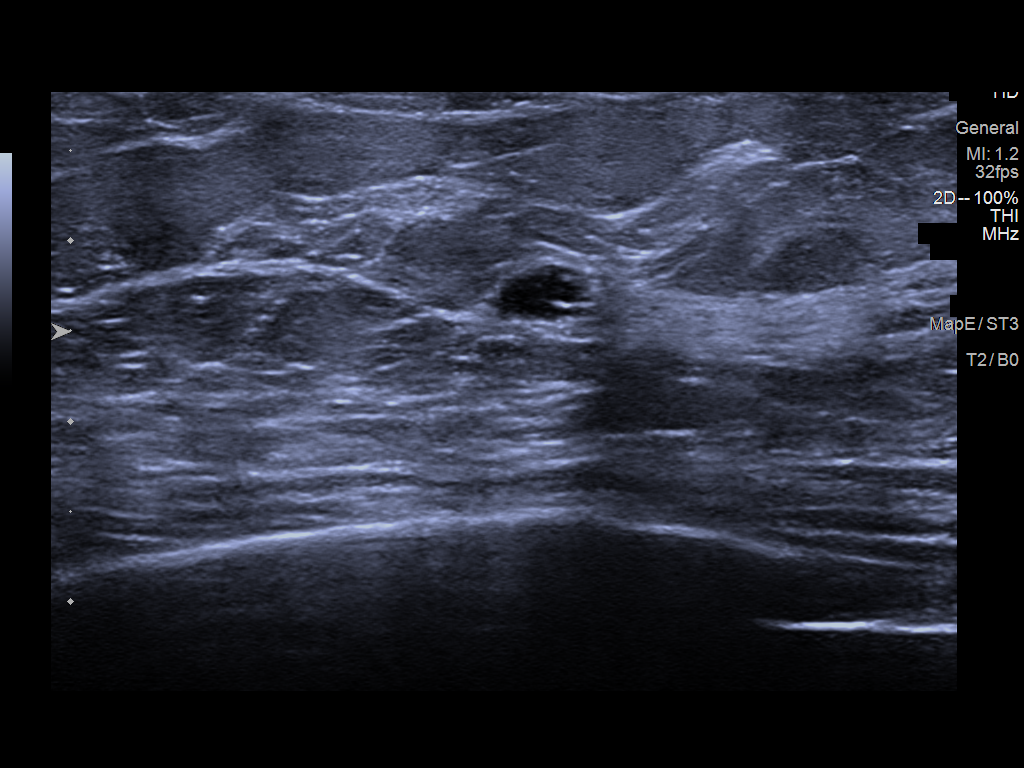
[im 5/7]
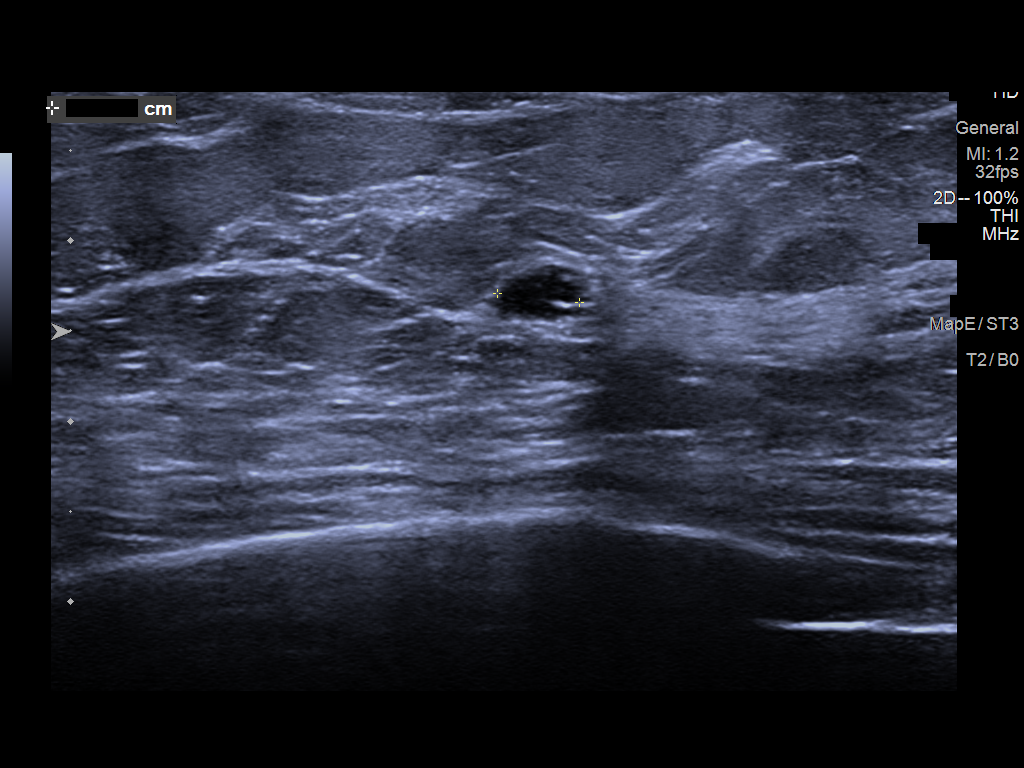
[im 6/7]
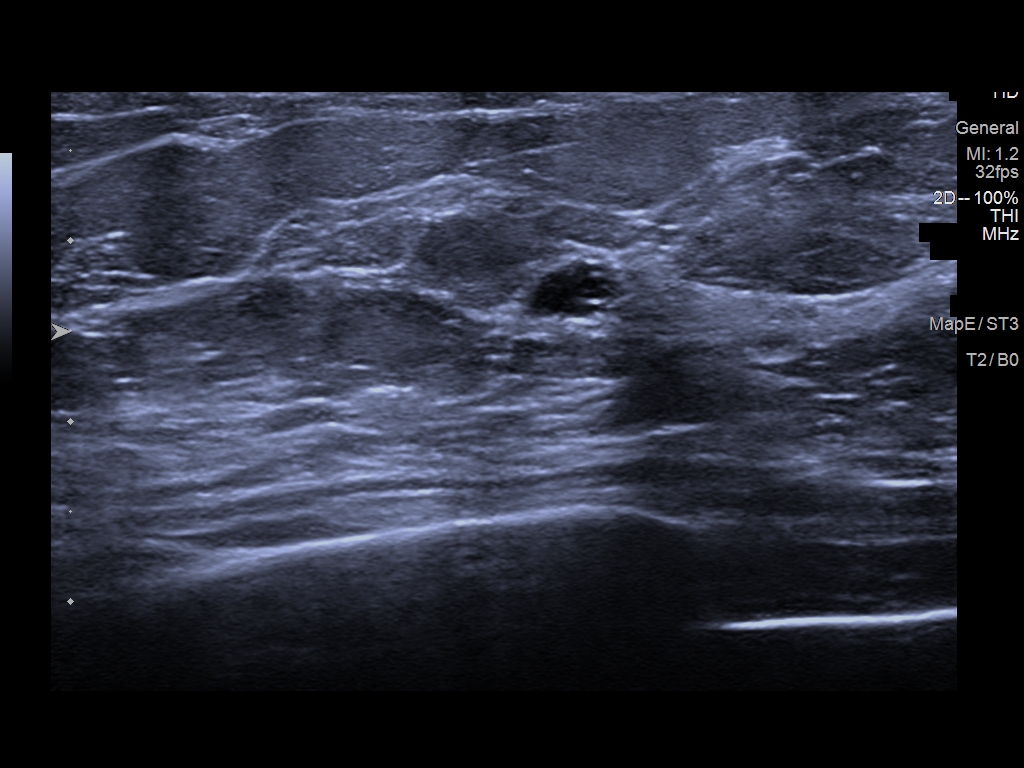
[im 7/7]
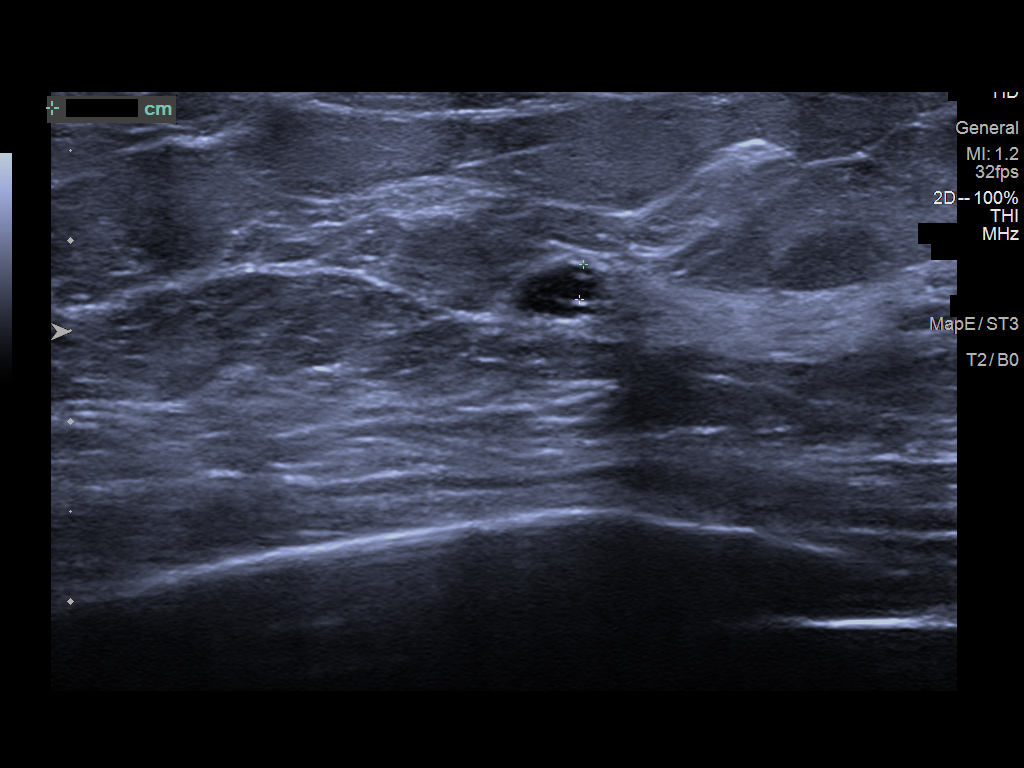

[7 of 7 positions shown; findings below may reference images not displayed]

ACR Breast Density Category c: The breast tissue is heterogeneously
dense, which may obscure small masses.
FINDINGS: Additional tomograms were performed of the right breast. There is an
oval mass in the far outer right breast measuring approximately
cm.

Targeted ultrasound of the right breast was performed. There is an
oval circumscribed near anechoic mass in the right breast at 9
o'clock 8 cm from nipple measuring 0.7 x 0.3 x 0.5 cm. This
demonstrates imaging features suggestive of a cluster of
cysts/complicated cysts and is felt to correspond with mammography
findings.
IMPRESSION: Probably benign cluster of cysts/complicated cyst in the right
breast.

RECOMMENDATION:
Recommend diagnostic mammography of the right breast with ultrasound
in 6 months.

I have discussed the findings and recommendations with the patient.
If applicable, a reminder letter will be sent to the patient
regarding the next appointment.

BI-RADS CATEGORY  3: Probably benign.

## 2023-09-10 LAB — HM DIABETES EYE EXAM

## 2023-10-09 ENCOUNTER — Other Ambulatory Visit: Payer: Self-pay | Admitting: Internal Medicine

## 2023-10-09 DIAGNOSIS — Z1231 Encounter for screening mammogram for malignant neoplasm of breast: Secondary | ICD-10-CM

## 2023-10-12 LAB — CMP14+EGFR
ALT: 20 IU/L (ref 0–32)
AST: 27 IU/L (ref 0–40)
Albumin: 4.5 g/dL (ref 3.8–4.9)
Alkaline Phosphatase: 88 IU/L (ref 44–121)
BUN/Creatinine Ratio: 19 (ref 9–23)
BUN: 15 mg/dL (ref 6–24)
Bilirubin Total: 0.5 mg/dL (ref 0.0–1.2)
CO2: 21 mmol/L (ref 20–29)
Calcium: 9.5 mg/dL (ref 8.7–10.2)
Chloride: 101 mmol/L (ref 96–106)
Creatinine, Ser: 0.79 mg/dL (ref 0.57–1.00)
Globulin, Total: 2.5 g/dL (ref 1.5–4.5)
Glucose: 123 mg/dL — ABNORMAL HIGH (ref 70–99)
Potassium: 4.3 mmol/L (ref 3.5–5.2)
Sodium: 137 mmol/L (ref 134–144)
Total Protein: 7 g/dL (ref 6.0–8.5)
eGFR: 88 mL/min/1.73 (ref >59)

## 2023-10-12 LAB — CBC WITH DIFFERENTIAL/PLATELET
Basophils Absolute: 0 x10E3/uL (ref 0.0–0.2)
Basos: 1 %
EOS (ABSOLUTE): 0.1 x10E3/uL (ref 0.0–0.4)
Eos: 2 %
Hematocrit: 42.7 % (ref 34.0–46.6)
Hemoglobin: 14.1 g/dL (ref 11.1–15.9)
Immature Grans (Abs): 0 x10E3/uL (ref 0.0–0.1)
Immature Granulocytes: 0 %
Lymphocytes Absolute: 1.6 x10E3/uL (ref 0.7–3.1)
Lymphs: 38 %
MCH: 30.5 pg (ref 26.6–33.0)
MCHC: 33 g/dL (ref 31.5–35.7)
MCV: 92 fL (ref 79–97)
Monocytes Absolute: 0.3 x10E3/uL (ref 0.1–0.9)
Monocytes: 7 %
Neutrophils Absolute: 2.1 x10E3/uL (ref 1.4–7.0)
Neutrophils: 52 %
Platelets: 350 x10E3/uL (ref 150–450)
RBC: 4.63 x10E6/uL (ref 3.77–5.28)
RDW: 11.6 % — ABNORMAL LOW (ref 11.7–15.4)
WBC: 4.1 x10E3/uL (ref 3.4–10.8)

## 2023-10-12 LAB — IRON,TIBC AND FERRITIN PANEL
Ferritin: 113 ng/mL (ref 15–150)
Iron Saturation: 24 % (ref 15–55)
Iron: 77 ug/dL (ref 27–159)
Total Iron Binding Capacity: 326 ug/dL (ref 250–450)
UIBC: 249 ug/dL (ref 131–425)

## 2023-10-12 LAB — LIPID PANEL
Chol/HDL Ratio: 3.6 ratio (ref 0.0–4.4)
Cholesterol, Total: 179 mg/dL (ref 100–199)
HDL: 50 mg/dL (ref >39)
LDL Chol Calc (NIH): 113 mg/dL — ABNORMAL HIGH (ref 0–99)
Triglycerides: 84 mg/dL (ref 0–149)
VLDL Cholesterol Cal: 16 mg/dL (ref 5–40)

## 2023-10-12 LAB — B12 AND FOLATE PANEL
Folate: >20 ng/mL (ref >3.0)
Vitamin B-12: 1052 pg/mL (ref 232–1245)

## 2023-10-12 LAB — VITAMIN D 25 HYDROXY (VIT D DEFICIENCY, FRACTURES): Vit D, 25-Hydroxy: 31.3 ng/mL (ref 30.0–100.0)

## 2023-10-16 ENCOUNTER — Telehealth: Payer: Self-pay | Admitting: Nurse Practitioner

## 2023-10-16 NOTE — Telephone Encounter (Signed)
 Left vm and sent mychart message to confirm 10/23/23 appointment-Toni

## 2023-10-23 ENCOUNTER — Ambulatory Visit (INDEPENDENT_AMBULATORY_CARE_PROVIDER_SITE_OTHER): Admitting: Nurse Practitioner

## 2023-10-23 ENCOUNTER — Encounter: Payer: Self-pay | Admitting: Nurse Practitioner

## 2023-10-23 VITALS — BP 132/86 | HR 83 | Temp 98.4°F | Resp 16 | Ht 64.0 in | Wt 163.0 lb

## 2023-10-23 DIAGNOSIS — M25551 Pain in right hip: Secondary | ICD-10-CM

## 2023-10-23 DIAGNOSIS — Z79899 Other long term (current) drug therapy: Secondary | ICD-10-CM

## 2023-10-23 DIAGNOSIS — E1169 Type 2 diabetes mellitus with other specified complication: Secondary | ICD-10-CM

## 2023-10-23 DIAGNOSIS — Z0001 Encounter for general adult medical examination with abnormal findings: Secondary | ICD-10-CM

## 2023-10-23 DIAGNOSIS — M25552 Pain in left hip: Secondary | ICD-10-CM

## 2023-10-23 DIAGNOSIS — R232 Flushing: Secondary | ICD-10-CM

## 2023-10-23 DIAGNOSIS — J0110 Acute frontal sinusitis, unspecified: Secondary | ICD-10-CM

## 2023-10-23 DIAGNOSIS — J301 Allergic rhinitis due to pollen: Secondary | ICD-10-CM

## 2023-10-23 LAB — POCT GLYCOSYLATED HEMOGLOBIN (HGB A1C): Hemoglobin A1C: 6 % — AB (ref 4.0–5.6)

## 2023-10-23 MED ORDER — AZITHROMYCIN 250 MG PO TABS
ORAL_TABLET | ORAL | 0 refills | Status: AC
Start: 2023-10-23 — End: 2023-10-28

## 2023-10-23 MED ORDER — MELOXICAM 15 MG PO TABS: 15.0000 mg | ORAL_TABLET | Freq: Every day | ORAL | 1 refills | Status: AC

## 2023-10-23 MED ORDER — OZEMPIC (2 MG/DOSE) 8 MG/3ML ~~LOC~~ SOPN: 2.0000 mg | PEN_INJECTOR | SUBCUTANEOUS | 3 refills | Status: AC

## 2023-10-23 MED ORDER — VENLAFAXINE HCL ER 75 MG PO CP24: 75.0000 mg | ORAL_CAPSULE | Freq: Every day | ORAL | 3 refills | Status: DC

## 2023-10-23 MED ORDER — MONTELUKAST SODIUM 10 MG PO TABS: 10.0000 mg | ORAL_TABLET | Freq: Every day | ORAL | 3 refills | Status: AC

## 2023-10-23 MED ORDER — LORATADINE 10 MG PO TABS
10.0000 mg | ORAL_TABLET | Freq: Every day | ORAL | 3 refills | Status: AC
Start: 2023-10-23 — End: ?

## 2023-10-23 NOTE — Progress Notes (Signed)
 [facility] [address] [place], [place] [address]  Internal MEDICINE  Office Visit Note  Patient Name: Mandy Shea  [id number]  [id number]  Date of Service: 10/23/2023  Chief Complaint  Patient presents with   Diabetes   Annual Exam    HPI Nashley presents for an annual well visit and physical exam.  Well-appearing 58 y.o. year-old @female @ with ___________  Routine CRC screening: Routine mammogram: DEXA scan: Pap smear: Eye exam and/or foot exam: done  Labs:  New or worsening pain: pulled muscle in left lower back, working it out  Other concerns:    Current Medication: Outpatient Encounter Medications as of 10/23/2023  Medication Sig Note   azithromycin  (ZITHROMAX ) 250 MG tablet Take 2 tablets on day 1, then 1 tablet daily on days 2 through 5    acyclovir  (ZOVIRAX ) 800 MG tablet One tab po at bedtime for cold sore    albuterol  (VENTOLIN  HFA) 108 (90 Base) MCG/ACT inhaler Inhale 2 puffs into the lungs every 6 (six) hours as needed for wheezing or shortness of breath.    aspirin  EC 81 MG tablet Take 1 tablet (81 mg total) by mouth daily. Swallow whole.    budesonide -formoterol  (SYMBICORT ) 160-4.5 MCG/ACT inhaler Inhale 2 puffs into the lungs 2 (two) times daily.    Dupilumab (DUPIXENT [place]) Inject 300 mg into the skin. 10/27/2022: Every other week   estradiol  (CLIMARA  - DOSED IN MG/24 HR) 0.075 mg/24hr patch Apply one patch to clean skin every 4 th day    glucose blood (ONETOUCH VERIO) test strip Use 1 test strip to check glucose once daily and as needed    Lancets (ONETOUCH DELICA PLUS LANCET30G) MISC Use 1 lancet to check glucose once daily and as needed    loratadine  (CLARITIN ) 10 MG tablet Take 1 tablet (10 mg total) by mouth daily.    meloxicam  (MOBIC ) 15 MG tablet Take 1 tablet (15 mg total) by mouth daily.    montelukast  (SINGULAIR ) 10 MG tablet Take 1 tablet (10 mg total) by mouth daily.    Semaglutide , 2 MG/DOSE, (OZEMPIC , 2 MG/DOSE,) 8 MG/3ML SOPN  Inject 2 mg into the skin once a week. FOR TYPE 2 DIABETES E11.69, E11.65    venlafaxine  XR (EFFEXOR -XR) 75 MG 24 hr capsule Take 1 capsule (75 mg total) by mouth daily with breakfast.    [DISCONTINUED] loratadine  (CLARITIN ) 10 MG tablet Take 1 tablet (10 mg total) by mouth daily.    [DISCONTINUED] meloxicam  (MOBIC ) 15 MG tablet Take 1 tablet (15 mg total) by mouth daily.    [DISCONTINUED] montelukast  (SINGULAIR ) 10 MG tablet Take 1 tablet (10 mg total) by mouth daily.    [DISCONTINUED] Semaglutide , 2 MG/DOSE, (OZEMPIC , 2 MG/DOSE,) 8 MG/3ML SOPN Inject 2 mg into the skin once a week. FOR TYPE 2 DIABETES E11.69, E11.65    [DISCONTINUED] venlafaxine  XR (EFFEXOR -XR) 75 MG 24 hr capsule Take 1 capsule (75 mg total) by mouth daily with breakfast.    No facility-administered encounter medications on file as of 10/23/2023.    Surgical History: Past Surgical History:  Procedure Laterality Date   CHOLECYSTECTOMY  09-26-13   COLONOSCOPY WITH PROPOFOL  N/A 01/05/2021   Procedure: COLONOSCOPY WITH PROPOFOL ;  Surgeon: Therisa Bi, MD;  Location: [facility] ENDOSCOPY;  Service: Gastroenterology;  Laterality: N/A;   CYSTOSCOPY N/A 01/12/2017   Procedure: CYSTOSCOPY;  Surgeon: Lake Read, MD;  Location: [facility] ORS;  Service: Gynecology;  Laterality: N/A;   HYSTEROSCOPY WITH D & C  11/24/2016   Procedure:  DILATATION AND CURETTAGE /HYSTEROSCOPY;  Surgeon: Lake Read, MD;  Location: [facility] ORS;  Service: Gynecology;;   KNEE SURGERY Right    LEEP  2010   [facility]   TONSILLECTOMY  2014   TOTAL LAPAROSCOPIC HYSTERECTOMY WITH SALPINGECTOMY Right 01/12/2017   Procedure: TOTAL LAPAROSCOPIC HYSTERECTOMY WITH RIGHT SALPINGECTOMY;  Surgeon: Lake Read, MD;  Location: [facility] ORS;  Service: Gynecology;  Laterality: Right;   TUBAL LIGATION  1997    Medical History: Past Medical History:  Diagnosis Date   Anemia    AS A CHILD   Diabetes mellitus without complication (HCC)    Fibroids    Hemorrhoids     Seasonal allergies    Sleep apnea    NO CPAP    Family History: Family History  Problem Relation Age of Onset   Diabetes Sister    Breast cancer Neg Hx     Social History   Socioeconomic History   Marital status: Married    Spouse name: Not on file   Number of children: Not on file   Years of education: Not on file   Highest education level: Not on file  Occupational History   Not on file  Tobacco Use   Smoking status: Never   Smokeless tobacco: Never  Vaping Use   Vaping status: Never Used  Substance and Sexual Activity   Alcohol use: No   Drug use: No   Sexual activity: Yes    Birth control/protection: None  Other Topics Concern   Not on file  Social History Narrative   Not on file   Social Drivers of Health   Financial Resource Strain: Not on file  Food Insecurity: Not on file  Transportation Needs: Not on file  Physical Activity: Not on file  Stress: Not on file  Social Connections: Not on file  Intimate Partner Violence: Not on file      Review of Systems  Vital Signs: BP 132/86   Pulse 83   Temp 98.4 F (36.9 C)   Resp 16   Ht 5' 4 (1.626 m)   Wt 163 lb (73.9 kg)   LMP 12/28/2016   SpO2 95%   BMI 27.98 kg/m    Physical Exam     Assessment/Plan: 1. Type 2 diabetes mellitus with other specified complication, without long-term current use of insulin  (HCC) (Primary) - POCT glycosylated hemoglobin (Hb A1C) - Semaglutide , 2 MG/DOSE, (OZEMPIC , 2 MG/DOSE,) 8 MG/3ML SOPN; Inject 2 mg into the skin once a week. FOR TYPE 2 DIABETES E11.69, E11.65  Dispense: 9 mL; Refill: 3  2. Encounter for medication review - loratadine  (CLARITIN ) 10 MG tablet; Take 1 tablet (10 mg total) by mouth daily.  Dispense: 90 tablet; Refill: 3 - montelukast  (SINGULAIR ) 10 MG tablet; Take 1 tablet (10 mg total) by mouth daily.  Dispense: 90 tablet; Refill: 3 - venlafaxine  XR (EFFEXOR -XR) 75 MG 24 hr capsule; Take 1 capsule (75 mg total) by mouth daily with breakfast.   Dispense: 90 capsule; Refill: 3  3. Acute hip pain, bilateral - meloxicam  (MOBIC ) 15 MG tablet; Take 1 tablet (15 mg total) by mouth daily.  Dispense: 30 tablet; Refill: 1  4. Acute non-recurrent frontal sinusitis     General Counseling: Kealy verbalizes understanding of the findings of todays visit and agrees with plan of treatment. I have discussed any further diagnostic evaluation that may be needed or ordered today. We also reviewed her medications today. she has been encouraged to call the office with any questions or  concerns that should arise related to todays visit.    Orders Placed This Encounter  Procedures   POCT glycosylated hemoglobin (Hb A1C)    Meds ordered this encounter  Medications   loratadine  (CLARITIN ) 10 MG tablet    Sig: Take 1 tablet (10 mg total) by mouth daily.    Dispense:  90 tablet    Refill:  3   meloxicam  (MOBIC ) 15 MG tablet    Sig: Take 1 tablet (15 mg total) by mouth daily.    Dispense:  30 tablet    Refill:  1    Fill new script today   montelukast  (SINGULAIR ) 10 MG tablet    Sig: Take 1 tablet (10 mg total) by mouth daily.    Dispense:  90 tablet    Refill:  3   Semaglutide , 2 MG/DOSE, (OZEMPIC , 2 MG/DOSE,) 8 MG/3ML SOPN    Sig: Inject 2 mg into the skin once a week. FOR TYPE 2 DIABETES E11.69, E11.65    Dispense:  9 mL    Refill:  3    Dx code E11.69, E11.65, SEND PA ASAP so we can get this approved right away fax # [phone]   venlafaxine  XR (EFFEXOR -XR) 75 MG 24 hr capsule    Sig: Take 1 capsule (75 mg total) by mouth daily with breakfast.    Dispense:  90 capsule    Refill:  3   azithromycin  (ZITHROMAX ) 250 MG tablet    Sig: Take 2 tablets on day 1, then 1 tablet daily on days 2 through 5    Dispense:  6 tablet    Refill:  0    Return in about 17 weeks (around 02/19/2024) for F/U, Recheck A1C, Tajai Suder PCP.   Total time spent:*** Minutes Time spent includes review of chart, medications, test results, and follow up plan  with the patient.    Controlled Substance Database was reviewed by me.  This patient was seen by Mardy Maxin, FNP-C in collaboration with Dr. Sigrid Bathe as a part of collaborative care agreement.  Magan Winnett R. Maxin, MSN, FNP-C Internal medicine

## 2023-10-24 ENCOUNTER — Encounter: Payer: Self-pay | Admitting: Nurse Practitioner

## 2023-10-27 ENCOUNTER — Encounter: Payer: Self-pay | Admitting: Nurse Practitioner

## 2023-10-27 ENCOUNTER — Encounter: Payer: 59 | Admitting: Nurse Practitioner

## 2023-11-23 ENCOUNTER — Ambulatory Visit
Admission: RE | Admit: 2023-11-23 | Discharge: 2023-11-23 | Disposition: A | Discharge status: Home / Self Care | Source: Ambulatory Visit | Attending: Internal Medicine | Admitting: Internal Medicine

## 2023-11-23 DIAGNOSIS — Z1231 Encounter for screening mammogram for malignant neoplasm of breast: Secondary | ICD-10-CM | POA: Diagnosis present

## 2024-02-20 ENCOUNTER — Encounter: Payer: Self-pay | Admitting: Nurse Practitioner

## 2024-02-20 ENCOUNTER — Ambulatory Visit: Admitting: Nurse Practitioner

## 2024-02-20 VITALS — BP 110/86 | HR 80 | Temp 96.7°F | Resp 16 | Ht 64.0 in | Wt 163.2 lb

## 2024-02-20 DIAGNOSIS — E1169 Type 2 diabetes mellitus with other specified complication: Secondary | ICD-10-CM

## 2024-02-20 DIAGNOSIS — E785 Hyperlipidemia, unspecified: Secondary | ICD-10-CM | POA: Diagnosis not present

## 2024-02-20 DIAGNOSIS — N951 Menopausal and female climacteric states: Secondary | ICD-10-CM | POA: Insufficient documentation

## 2024-02-20 DIAGNOSIS — L209 Atopic dermatitis, unspecified: Secondary | ICD-10-CM

## 2024-02-20 LAB — POCT GLYCOSYLATED HEMOGLOBIN (HGB A1C): Hemoglobin A1C: 6.2 % — AB (ref 4.0–5.6)

## 2024-02-20 NOTE — Progress Notes (Signed)
 [facility] [address] [place], [place] [address]  Internal MEDICINE  Office Visit Note  Patient Name: Mandy Shea  [id number]  [id number]  Date of Service: 02/20/2024  Chief Complaint  Patient presents with   Diabetes   Follow-up    HPI Mandy Shea presents for a follow-up visit for diabetes, high cholesterol, atopic dermatitis, asthma and perimenopause.  Diabetes -- A1c is stable at 6.2 today, doing well with ozempic , no issues today.  High cholesterol -- not currently on statin therapy. Her LDL is only slightly elevated and the rest of her cholesterol panel is normal.  Atopic dermatitis -- gets dupixent injection through dermatology, doing well.  Vasomotor symptoms of menopause -- on climara  patch, doing well.  Venlafaxine  was stopped but then she tried to take it and her scalp was very itchy.     Current Medication: Outpatient Encounter Medications as of 02/20/2024  Medication Sig Note   fluconazole  (DIFLUCAN ) 200 MG tablet Take by mouth.    acyclovir  (ZOVIRAX ) 800 MG tablet One tab po at bedtime for cold sore    albuterol  (VENTOLIN  HFA) 108 (90 Base) MCG/ACT inhaler Inhale 2 puffs into the lungs every 6 (six) hours as needed for wheezing or shortness of breath.    aspirin  EC 81 MG tablet Take 1 tablet (81 mg total) by mouth daily. Swallow whole.    budesonide -formoterol  (SYMBICORT ) 160-4.5 MCG/ACT inhaler Inhale 2 puffs into the lungs 2 (two) times daily.    Dupilumab (DUPIXENT [place]) Inject 300 mg into the skin. 10/27/2022: Every other week   estradiol  (CLIMARA  - DOSED IN MG/24 HR) 0.075 mg/24hr patch Apply one patch to clean skin every 4 th day    glucose blood (ONETOUCH VERIO) test strip Use 1 test strip to check glucose once daily and as needed    Lancets (ONETOUCH DELICA PLUS LANCET30G) MISC Use 1 lancet to check glucose once daily and as needed    loratadine  (CLARITIN ) 10 MG tablet Take 1 tablet (10 mg total) by mouth daily.    meloxicam  (MOBIC ) 15 MG  tablet Take 1 tablet (15 mg total) by mouth daily.    montelukast  (SINGULAIR ) 10 MG tablet Take 1 tablet (10 mg total) by mouth daily.    Semaglutide , 2 MG/DOSE, (OZEMPIC , 2 MG/DOSE,) 8 MG/3ML SOPN Inject 2 mg into the skin once a week. FOR TYPE 2 DIABETES E11.69, E11.65    [DISCONTINUED] venlafaxine  XR (EFFEXOR -XR) 75 MG 24 hr capsule Take 1 capsule (75 mg total) by mouth daily with breakfast.    No facility-administered encounter medications on file as of 02/20/2024.    Surgical History: Past Surgical History:  Procedure Laterality Date   CHOLECYSTECTOMY  09-26-13   COLONOSCOPY WITH PROPOFOL  N/A 01/05/2021   Procedure: COLONOSCOPY WITH PROPOFOL ;  Surgeon: Therisa Bi, MD;  Location: [facility] ENDOSCOPY;  Service: Gastroenterology;  Laterality: N/A;   CYSTOSCOPY N/A 01/12/2017   Procedure: CYSTOSCOPY;  Surgeon: Lake Read, MD;  Location: [facility] ORS;  Service: Gynecology;  Laterality: N/A;   HYSTEROSCOPY WITH D & C  11/24/2016   Procedure: DILATATION AND CURETTAGE /HYSTEROSCOPY;  Surgeon: Lake Read, MD;  Location: [facility] ORS;  Service: Gynecology;;   KNEE SURGERY Right    LEEP  2010   [facility]   TONSILLECTOMY  2014   TOTAL LAPAROSCOPIC HYSTERECTOMY WITH SALPINGECTOMY Right 01/12/2017   Procedure: TOTAL LAPAROSCOPIC HYSTERECTOMY WITH RIGHT SALPINGECTOMY;  Surgeon: Lake Read, MD;  Location: [facility] ORS;  Service: Gynecology;  Laterality: Right;   TUBAL LIGATION  1997  Medical History: Past Medical History:  Diagnosis Date   Anemia    AS A CHILD   Diabetes mellitus without complication (HCC)    Fibroids    Hemorrhoids    Seasonal allergies    Sleep apnea    NO CPAP    Family History: Family History  Problem Relation Age of Onset   Diabetes Sister    Breast cancer Neg Hx     Social History   Socioeconomic History   Marital status: Married    Spouse name: Not on file   Number of children: Not on file   Years of education: Not on file   Highest education  level: Not on file  Occupational History   Not on file  Tobacco Use   Smoking status: Never   Smokeless tobacco: Never  Vaping Use   Vaping status: Never Used  Substance and Sexual Activity   Alcohol use: No   Drug use: No   Sexual activity: Yes    Birth control/protection: None  Other Topics Concern   Not on file  Social History Narrative   Not on file   Social Drivers of Health   Tobacco Use: Low Risk (02/20/2024)   Patient History    Smoking Tobacco Use: Never    Smokeless Tobacco Use: Never    Passive Exposure: Not on file  Financial Resource Strain: Not on file  Food Insecurity: Not on file  Transportation Needs: Not on file  Physical Activity: Not on file  Stress: Not on file  Social Connections: Not on file  Intimate Partner Violence: Not on file  Depression (EYV7-0): Low Risk (10/23/2023)   Depression (PHQ2-9)    PHQ-2 Score: 0  Alcohol Screen: Low Risk (06/11/2021)   Alcohol Screen    Last Alcohol Screening Score (AUDIT): 0  Housing: Not on file  Utilities: Not on file  Health Literacy: Not on file      Review of Systems  Constitutional: Negative.  Negative for chills, fatigue, fever and unexpected weight change.  HENT:  Positive for postnasal drip. Negative for congestion, mouth sores, rhinorrhea, sneezing and sore throat.   Eyes:  Negative for redness.  Respiratory: Negative.  Negative for cough, chest tightness, shortness of breath and wheezing.   Cardiovascular: Negative.  Negative for chest pain and palpitations.  Gastrointestinal: Negative.  Negative for abdominal pain, constipation, diarrhea, nausea and vomiting.  Genitourinary:  Negative for dysuria, flank pain and frequency.  Musculoskeletal: Negative.  Negative for arthralgias, back pain, joint swelling and neck pain.  Skin:  Negative for rash.  Neurological: Negative.  Negative for tremors and numbness.  Hematological:  Negative for adenopathy. Does not bruise/bleed easily.   Psychiatric/Behavioral: Negative.  Negative for behavioral problems (Depression), sleep disturbance and suicidal ideas. The patient is not nervous/anxious.     Vital Signs: BP 110/86   Pulse 80   Temp (!) 96.7 F (35.9 C)   Resp 16   Ht 5' 4 (1.626 m)   Wt 163 lb 3.2 oz (74 kg)   LMP 12/28/2016   SpO2 98%   BMI 28.01 kg/m    Physical Exam Vitals reviewed.  Constitutional:      General: She is not in acute distress.    Appearance: Normal appearance. She is not ill-appearing.  HENT:     Head: Normocephalic and atraumatic.  Eyes:     Pupils: Pupils are equal, round, and reactive to light.  Cardiovascular:     Rate and Rhythm: Normal rate and regular  rhythm.  Pulmonary:     Effort: Pulmonary effort is normal. No respiratory distress.  Neurological:     Mental Status: She is alert and oriented to person, place, and time.  Psychiatric:        Mood and Affect: Mood normal.        Behavior: Behavior normal.        Assessment/Plan: 1. Type 2 diabetes mellitus with other specified complication, without long-term current use of insulin  (HCC) (Primary) A1c is stable, continue ozempic  as prescribed.  - POCT glycosylated hemoglobin (Hb A1C)  2. Hyperlipidemia associated with type 2 diabetes mellitus (HCC) Continue low cholesterol low fat diet and increase physical activity as tolerated.   3. Atopic dermatitis, unspecified type Continue to follow up with dermatology and take dupixent as prescribed by them.   4. Vasomotor symptoms due to menopause Continue climara  patch as prescribed. Discontinue venlafaxine  for now.    General Counseling: Daysy verbalizes understanding of the findings of todays visit and agrees with plan of treatment. I have discussed any further diagnostic evaluation that may be needed or ordered today. We also reviewed her medications today. she has been encouraged to call the office with any questions or concerns that should arise related to todays  visit.    Orders Placed This Encounter  Procedures   POCT glycosylated hemoglobin (Hb A1C)    No orders of the defined types were placed in this encounter.   Return for previously scheduled, CPE, Mckay Tegtmeyer PCP in august, call 2wks ahead for lab orders. .   Total time spent:30 Minutes Time spent includes review of chart, medications, test results, and follow up plan with the patient.   [place] Controlled Substance Database was reviewed by me.  This patient was seen by Mardy Maxin, FNP-C in collaboration with Dr. Sigrid Bathe as a part of collaborative care agreement.   Gleb Mcguire R. Maxin, MSN, FNP-C Internal medicine

## 2024-02-28 ENCOUNTER — Telehealth (INDEPENDENT_AMBULATORY_CARE_PROVIDER_SITE_OTHER): Admitting: Nurse Practitioner

## 2024-02-28 ENCOUNTER — Encounter: Payer: Self-pay | Admitting: Nurse Practitioner

## 2024-02-28 VITALS — Ht 64.0 in | Wt 158.0 lb

## 2024-02-28 DIAGNOSIS — Z20828 Contact with and (suspected) exposure to other viral communicable diseases: Secondary | ICD-10-CM | POA: Diagnosis not present

## 2024-02-28 DIAGNOSIS — R051 Acute cough: Secondary | ICD-10-CM

## 2024-02-28 DIAGNOSIS — J111 Influenza due to unidentified influenza virus with other respiratory manifestations: Secondary | ICD-10-CM

## 2024-02-28 MED ORDER — HYDROCOD POLI-CHLORPHE POLI ER 10-8 MG/5ML PO SUER: 5.0000 mL | Freq: Two times a day (BID) | ORAL | 0 refills | Status: AC | PRN

## 2024-02-28 MED ORDER — OSELTAMIVIR PHOSPHATE 75 MG PO CAPS: 75.0000 mg | ORAL_CAPSULE | Freq: Two times a day (BID) | ORAL | 0 refills | Status: AC

## 2024-02-28 NOTE — Progress Notes (Signed)
 [facility] [address] [place], [place] [address]  Internal MEDICINE  Telephone Visit  Patient Name: Mandy Shea  [id number]  [id number]  Date of Service: 02/28/2024  I connected with the patient at 0823 by telephone and verified the patients identity using two identifiers.   I discussed the limitations, risks, security and privacy concerns of performing an evaluation and management service by telephone and the availability of in person appointments. I also discussed with the patient that there may be a patient responsible charge related to the service.  The patient expressed understanding and agrees to proceed.    Chief Complaint  Patient presents with   Telephone Screen    Flu exposure  and going for 2 days    Telephone Assessment   Sinusitis   Cough   Ear Pain    Sinusitis Associated symptoms include chills, congestion, coughing, ear pain, headaches, sinus pressure, sneezing and a sore throat. Pertinent negatives include no shortness of breath.  Cough Associated symptoms include chills, ear pain, headaches, postnasal drip, rhinorrhea and a sore throat. Pertinent negatives include no chest pain, fever, myalgias, shortness of breath or wheezing.   Mandy Shea presents for a telehealth virtual visit for possible flu Grandson diagnosed with flu about 2 days ago.  Per patient, her symptoms started in the past 2 days. She reports ear pain, aching left ear, sore throat and trouble swallowing, nasal congestion, sinus drainage, sneezing, chills, fatigue Denies any body aches yet, and no fever.    Current Medication: Outpatient Encounter Medications as of 02/28/2024  Medication Sig Note   acyclovir  (ZOVIRAX ) 800 MG tablet One tab po at bedtime for cold sore    albuterol  (VENTOLIN  HFA) 108 (90 Base) MCG/ACT inhaler Inhale 2 puffs into the lungs every 6 (six) hours as needed for wheezing or shortness of breath.    aspirin  EC 81 MG tablet Take 1 tablet (81 mg total) by  mouth daily. Swallow whole.    budesonide -formoterol  (SYMBICORT ) 160-4.5 MCG/ACT inhaler Inhale 2 puffs into the lungs 2 (two) times daily.    chlorpheniramine-HYDROcodone (TUSSIONEX) 10-8 MG/5ML Take 5 mLs by mouth every 12 (twelve) hours as needed for cough.    Dupilumab (DUPIXENT Raymond) Inject 300 mg into the skin. 10/27/2022: Every other week   estradiol  (CLIMARA  - DOSED IN MG/24 HR) 0.075 mg/24hr patch Apply one patch to clean skin every 4 th day    fluconazole  (DIFLUCAN ) 200 MG tablet Take by mouth.    glucose blood (ONETOUCH VERIO) test strip Use 1 test strip to check glucose once daily and as needed    Lancets (ONETOUCH DELICA PLUS LANCET30G) MISC Use 1 lancet to check glucose once daily and as needed    loratadine  (CLARITIN ) 10 MG tablet Take 1 tablet (10 mg total) by mouth daily.    meloxicam  (MOBIC ) 15 MG tablet Take 1 tablet (15 mg total) by mouth daily.    montelukast  (SINGULAIR ) 10 MG tablet Take 1 tablet (10 mg total) by mouth daily.    oseltamivir (TAMIFLU) 75 MG capsule Take 1 capsule (75 mg total) by mouth 2 (two) times daily.    Semaglutide , 2 MG/DOSE, (OZEMPIC , 2 MG/DOSE,) 8 MG/3ML SOPN Inject 2 mg into the skin once a week. FOR TYPE 2 DIABETES E11.69, E11.65    No facility-administered encounter medications on file as of 02/28/2024.    Surgical History: Past Surgical History:  Procedure Laterality Date   CHOLECYSTECTOMY  09-26-13   COLONOSCOPY WITH PROPOFOL  N/A 01/05/2021   Procedure:  COLONOSCOPY WITH PROPOFOL ;  Surgeon: Therisa Bi, MD;  Location: [facility] ENDOSCOPY;  Service: Gastroenterology;  Laterality: N/A;   CYSTOSCOPY N/A 01/12/2017   Procedure: CYSTOSCOPY;  Surgeon: Lake Read, MD;  Location: [facility] ORS;  Service: Gynecology;  Laterality: N/A;   HYSTEROSCOPY WITH D & C  11/24/2016   Procedure: DILATATION AND CURETTAGE /HYSTEROSCOPY;  Surgeon: Lake Read, MD;  Location: [facility] ORS;  Service: Gynecology;;   KNEE SURGERY Right    LEEP  2010   [facility]    TONSILLECTOMY  2014   TOTAL LAPAROSCOPIC HYSTERECTOMY WITH SALPINGECTOMY Right 01/12/2017   Procedure: TOTAL LAPAROSCOPIC HYSTERECTOMY WITH RIGHT SALPINGECTOMY;  Surgeon: Lake Read, MD;  Location: [facility] ORS;  Service: Gynecology;  Laterality: Right;   TUBAL LIGATION  1997    Medical History: Past Medical History:  Diagnosis Date   Anemia    AS A CHILD   Diabetes mellitus without complication (HCC)    Fibroids    Hemorrhoids    Seasonal allergies    Sleep apnea    NO CPAP    Family History: Family History  Problem Relation Age of Onset   Diabetes Sister    Breast cancer Neg Hx     Social History   Socioeconomic History   Marital status: Married    Spouse name: Not on file   Number of children: Not on file   Years of education: Not on file   Highest education level: Not on file  Occupational History   Not on file  Tobacco Use   Smoking status: Never   Smokeless tobacco: Never  Vaping Use   Vaping status: Never Used  Substance and Sexual Activity   Alcohol use: No   Drug use: No   Sexual activity: Yes    Birth control/protection: None  Other Topics Concern   Not on file  Social History Narrative   Not on file   Social Drivers of Health   Tobacco Use: Low Risk (02/28/2024)   Patient History    Smoking Tobacco Use: Never    Smokeless Tobacco Use: Never    Passive Exposure: Not on file  Financial Resource Strain: Not on file  Food Insecurity: Not on file  Transportation Needs: Not on file  Physical Activity: Not on file  Stress: Not on file  Social Connections: Not on file  Intimate Partner Violence: Not on file  Depression (PHQ2-9): Low Risk (10/23/2023)   Depression (PHQ2-9)    PHQ-2 Score: 0  Alcohol Screen: Low Risk (06/11/2021)   Alcohol Screen    Last Alcohol Screening Score (AUDIT): 0  Housing: Not on file  Utilities: Not on file  Health Literacy: Not on file      Review of Systems  Constitutional:  Positive for chills and fatigue.  Negative for fever.  HENT:  Positive for congestion, ear pain, postnasal drip, rhinorrhea, sinus pressure, sinus pain, sneezing, sore throat and trouble swallowing.   Respiratory:  Positive for cough. Negative for chest tightness, shortness of breath and wheezing.   Cardiovascular: Negative.  Negative for chest pain and palpitations.  Gastrointestinal:  Negative for diarrhea, nausea and vomiting.  Musculoskeletal:  Negative for myalgias.  Neurological:  Positive for headaches.    Vital Signs: Ht 5' 4 (1.626 m)   Wt 158 lb (71.7 kg)   LMP 12/28/2016   BMI 27.12 kg/m    Observation/Objective: She is alert and oriented. No acute distress noted.     Assessment/Plan: 1. Influenza (Primary) Tamiflu sent to pharmacy  - oseltamivir (  TAMIFLU) 75 MG capsule; Take 1 capsule (75 mg total) by mouth 2 (two) times daily.  Dispense: 10 capsule; Refill: 0  2. Exposure to the flu Tamiflu sent to pharmacy - oseltamivir (TAMIFLU) 75 MG capsule; Take 1 capsule (75 mg total) by mouth 2 (two) times daily.  Dispense: 10 capsule; Refill: 0  3. Acute cough Cough medication prescribed, take as needed  - chlorpheniramine-HYDROcodone (TUSSIONEX) 10-8 MG/5ML; Take 5 mLs by mouth every 12 (twelve) hours as needed for cough.  Dispense: 140 mL; Refill: 0   General Counseling: Breannah verbalizes understanding of the findings of today's phone visit and agrees with plan of treatment. I have discussed any further diagnostic evaluation that may be needed or ordered today. We also reviewed her medications today. she has been encouraged to call the office with any questions or concerns that should arise related to todays visit.  Return if symptoms worsen or fail to improve.   No orders of the defined types were placed in this encounter.   Meds ordered this encounter  Medications   oseltamivir (TAMIFLU) 75 MG capsule    Sig: Take 1 capsule (75 mg total) by mouth 2 (two) times daily.    Dispense:  10 capsule     Refill:  0    Fill new script today.   chlorpheniramine-HYDROcodone (TUSSIONEX) 10-8 MG/5ML    Sig: Take 5 mLs by mouth every 12 (twelve) hours as needed for cough.    Dispense:  140 mL    Refill:  0    Fill new script today    Time spent:20 Minutes Time spent with patient included reviewing progress notes, labs, imaging studies, and discussing plan for follow up.  [place] Controlled Substance Database was reviewed by me for overdose risk score (ORS) if appropriate.  This patient was seen by Mardy Maxin, FNP-C in collaboration with Dr. Sigrid Bathe as a part of collaborative care agreement.  Myli Pae R. Maxin, MSN, FNP-C Internal medicine

## 2024-03-05 ENCOUNTER — Other Ambulatory Visit: Payer: Self-pay

## 2024-03-05 MED ORDER — OFLOXACIN 0.3 % OT SOLN: 4.0000 [drp] | Freq: Every day | OTIC | 0 refills | Status: AC

## 2024-03-05 NOTE — Telephone Encounter (Signed)
 Pt called that she still having pain in left ear and alyssa told pt call back if she not feeling better after she finished Tamiflu  as per alyssa sent ofloxacin  ear drops

## 2024-03-20 LAB — HM DIABETES EYE EXAM

## 2024-03-21 ENCOUNTER — Other Ambulatory Visit: Payer: Self-pay | Admitting: Internal Medicine

## 2024-03-21 DIAGNOSIS — N959 Unspecified menopausal and perimenopausal disorder: Secondary | ICD-10-CM

## 2024-04-18 ENCOUNTER — Ambulatory Visit: Admitting: Cardiology

## 2024-10-23 ENCOUNTER — Encounter: Admitting: Nurse Practitioner
